# Patient Record
Sex: Male | Born: 1955 | Race: Black or African American | Hispanic: No | Marital: Married | Smoking: Never smoker
Health system: Southern US, Community
[De-identification: ages and names within clinical notes are randomized; demographics above are authoritative.]

## PROBLEM LIST (undated history)

## (undated) DIAGNOSIS — M545 Low back pain: Secondary | ICD-10-CM

## (undated) DIAGNOSIS — G8190 Hemiplegia, unspecified affecting unspecified side: Secondary | ICD-10-CM

## (undated) DIAGNOSIS — F438 Other reactions to severe stress: Secondary | ICD-10-CM

## (undated) DIAGNOSIS — I5022 Chronic systolic (congestive) heart failure: Secondary | ICD-10-CM

## (undated) DIAGNOSIS — F519 Sleep disorder not due to a substance or known physiological condition, unspecified: Secondary | ICD-10-CM

## (undated) DIAGNOSIS — E785 Hyperlipidemia, unspecified: Secondary | ICD-10-CM

## (undated) DIAGNOSIS — Z8679 Personal history of other diseases of the circulatory system: Secondary | ICD-10-CM

## (undated) DIAGNOSIS — I10 Essential (primary) hypertension: Secondary | ICD-10-CM

## (undated) DIAGNOSIS — E291 Testicular hypofunction: Secondary | ICD-10-CM

## (undated) HISTORY — DX: Low back pain: M54.5

## (undated) HISTORY — DX: Testicular hypofunction: E29.1

## (undated) HISTORY — DX: Hemiplegia, unspecified affecting unspecified side: G81.90

## (undated) HISTORY — DX: Other reactions to severe stress: F43.8

## (undated) HISTORY — PX: OTHER SURGICAL HISTORY: SHX169

## (undated) HISTORY — DX: Personal history of other diseases of the circulatory system: Z86.79

## (undated) HISTORY — DX: Sleep disorder not due to a substance or known physiological condition, unspecified: F51.9

## (undated) HISTORY — DX: Hyperlipidemia, unspecified: E78.5

---

## 1998-12-09 ENCOUNTER — Encounter: Payer: Self-pay | Admitting: Emergency Medicine

## 1998-12-09 ENCOUNTER — Emergency Department (HOSPITAL_COMMUNITY): Admission: EM | Admit: 1998-12-09 | Discharge: 1998-12-09 | Payer: Self-pay | Admitting: Emergency Medicine

## 1999-12-11 ENCOUNTER — Emergency Department (HOSPITAL_COMMUNITY): Admission: EM | Admit: 1999-12-11 | Discharge: 1999-12-11 | Payer: Self-pay | Admitting: Emergency Medicine

## 2005-04-12 ENCOUNTER — Ambulatory Visit: Payer: Self-pay | Admitting: Internal Medicine

## 2005-04-22 ENCOUNTER — Ambulatory Visit: Payer: Self-pay | Admitting: Cardiology

## 2005-05-26 ENCOUNTER — Ambulatory Visit: Payer: Self-pay | Admitting: Internal Medicine

## 2005-06-22 ENCOUNTER — Ambulatory Visit: Payer: Self-pay | Admitting: Internal Medicine

## 2005-12-16 ENCOUNTER — Ambulatory Visit: Payer: Self-pay | Admitting: Internal Medicine

## 2005-12-16 LAB — CONVERTED CEMR LAB: PSA: 1 ng/mL

## 2005-12-22 ENCOUNTER — Ambulatory Visit: Payer: Self-pay | Admitting: Endocrinology

## 2006-01-19 ENCOUNTER — Ambulatory Visit: Payer: Self-pay | Admitting: Endocrinology

## 2006-03-06 ENCOUNTER — Ambulatory Visit: Payer: Self-pay | Admitting: Internal Medicine

## 2006-03-14 ENCOUNTER — Ambulatory Visit: Payer: Self-pay | Admitting: Internal Medicine

## 2007-06-07 ENCOUNTER — Ambulatory Visit: Payer: Self-pay | Admitting: Internal Medicine

## 2007-06-08 ENCOUNTER — Encounter: Payer: Self-pay | Admitting: Internal Medicine

## 2007-06-08 DIAGNOSIS — M545 Low back pain, unspecified: Secondary | ICD-10-CM

## 2007-06-08 DIAGNOSIS — E785 Hyperlipidemia, unspecified: Secondary | ICD-10-CM

## 2007-06-08 DIAGNOSIS — Z8679 Personal history of other diseases of the circulatory system: Secondary | ICD-10-CM | POA: Insufficient documentation

## 2007-06-08 HISTORY — DX: Low back pain, unspecified: M54.50

## 2007-06-08 HISTORY — DX: Personal history of other diseases of the circulatory system: Z86.79

## 2007-06-08 HISTORY — DX: Hyperlipidemia, unspecified: E78.5

## 2007-06-14 ENCOUNTER — Ambulatory Visit: Payer: Self-pay | Admitting: Internal Medicine

## 2007-08-25 ENCOUNTER — Emergency Department (HOSPITAL_COMMUNITY): Admission: EM | Admit: 2007-08-25 | Discharge: 2007-08-25 | Payer: Self-pay | Admitting: Emergency Medicine

## 2007-10-10 ENCOUNTER — Ambulatory Visit: Payer: Self-pay | Admitting: Internal Medicine

## 2007-10-10 DIAGNOSIS — G8190 Hemiplegia, unspecified affecting unspecified side: Secondary | ICD-10-CM | POA: Insufficient documentation

## 2007-10-10 DIAGNOSIS — F519 Sleep disorder not due to a substance or known physiological condition, unspecified: Secondary | ICD-10-CM

## 2007-10-10 DIAGNOSIS — F438 Other reactions to severe stress: Secondary | ICD-10-CM

## 2007-10-10 DIAGNOSIS — E291 Testicular hypofunction: Secondary | ICD-10-CM

## 2007-10-10 DIAGNOSIS — F4389 Other reactions to severe stress: Secondary | ICD-10-CM

## 2007-10-10 HISTORY — DX: Sleep disorder not due to a substance or known physiological condition, unspecified: F51.9

## 2007-10-10 HISTORY — DX: Hemiplegia, unspecified affecting unspecified side: G81.90

## 2007-10-10 HISTORY — DX: Other reactions to severe stress: F43.89

## 2007-10-10 HISTORY — DX: Testicular hypofunction: E29.1

## 2007-10-10 HISTORY — DX: Other reactions to severe stress: F43.8

## 2007-10-26 ENCOUNTER — Ambulatory Visit: Payer: Self-pay | Admitting: Internal Medicine

## 2008-02-22 ENCOUNTER — Ambulatory Visit: Payer: Self-pay | Admitting: Internal Medicine

## 2011-05-28 ENCOUNTER — Encounter: Payer: Self-pay | Admitting: Internal Medicine

## 2011-05-28 DIAGNOSIS — Z0000 Encounter for general adult medical examination without abnormal findings: Secondary | ICD-10-CM | POA: Insufficient documentation

## 2011-05-30 ENCOUNTER — Ambulatory Visit (INDEPENDENT_AMBULATORY_CARE_PROVIDER_SITE_OTHER): Payer: Self-pay | Admitting: Internal Medicine

## 2011-05-30 ENCOUNTER — Encounter: Payer: Self-pay | Admitting: Internal Medicine

## 2011-05-30 VITALS — BP 120/78 | HR 69 | Temp 98.6°F | Ht 73.0 in | Wt 234.0 lb

## 2011-05-30 DIAGNOSIS — R109 Unspecified abdominal pain: Secondary | ICD-10-CM

## 2011-05-30 MED ORDER — METRONIDAZOLE 500 MG PO TABS: 500.0000 mg | ORAL_TABLET | Freq: Three times a day (TID) | ORAL | Status: AC

## 2011-05-30 NOTE — Patient Instructions (Signed)
Take all new medications as prescribed Continue all other medications as before Please let us know if you are improved, for the letter to be done Please let us know if you are not improved by the end of the week, as you should be referred to GI

## 2011-05-30 NOTE — Assessment & Plan Note (Signed)
Exam  Benign, but with signficant diarrhea, pain, hemtochezia ongoing - diff includes bacterial infect,  c diff, giardia , IBD or other inflamm or infectious bowel disease ;  Has no health ins today and simply cannot afford testing which would include ideally stool for c diff, culture, parasites;  As well as cbc and refer GI; for now will tx with flagyl asd,  to f/u any worsening symptoms or concerns

## 2011-05-30 NOTE — Progress Notes (Signed)
Subjective:    Patient ID: Ryan Cole, male    DOB: 1956-04-03, 55 y.o.   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  No n/v, wt loss, fever, and no obvious triggers that seem to bring it on, such as diet change.  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  No other flank of back pain, but overall pain worse even with walking, and sometimes has thought his abd distended, maybe he thought due to immodium use.  Did have about 6 wks abd pain, diarrhea and blood a few yrs ago but symptoms resolved with a "tx for a parasite" (? Giardia) in 1998.  Has had colonoscopy with [facility] - > 4-5 yrs ago, neg for polyps. Past Medical History  Diagnosis Date  . 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 HYPERLIPIDEMIA 06/08/2007  . HYPOGONADISM, MALE 10/10/2007  . INSOMNIA-SLEEP DISORDER-UNSPEC 10/10/2007  . LOW BACK PAIN 06/08/2007  . WEAKNESS, LEFT SIDE OF BODY 10/10/2007   No past surgical history on file.  reports that he has never smoked. He does not have any smokeless tobacco history on file. He reports that he does not drink alcohol. His drug history not on file. family history is not on file. No Known Allergies Current Outpatient Prescriptions on File Prior to Visit  Medication Sig Dispense Refill  . cyclobenzaprine (FLEXERIL) 5 MG tablet Take 5 mg by mouth 3 (three) times daily as needed.        . diclofenac (VOLTAREN) 75 MG EC tablet Take 75 mg by mouth 2 (two) times daily.        Marland Kitchen HYDROcodone-acetaminophen (VICODIN) 5-500  MG per tablet Take 1 tablet by mouth 4 (four) times daily as needed.        . zolpidem (AMBIEN) 10 MG tablet Take 10 mg by mouth at bedtime as needed.         Review of Systems Review of Systems  Constitutional: Negative for diaphoresis and unexpected weight change.  HENT: Negative for drooling and tinnitus.   Eyes: Negative for photophobia and visual disturbance.  Respiratory: Negative for choking and stridor.       Objective:   Physical Exam BP 120/78  Pulse 69  Temp(Src) 98.6 F (37 C) (Oral)  Ht 6\' 1"  (1.854 m)  Wt 234 lb (106.142 kg)  BMI 30.87 kg/m2  SpO2 98% Physical Exam  VS noted Constitutional: Pt appears well-developed and well-nourished.  HENT: Head: Normocephalic.  Right Ear: External ear normal.  Left Ear: External ear normal.  Eyes: Conjunctivae and EOM are normal. Pupils are equal, round, and reactive to light.  Neck: Normal range of motion. Neck supple.  Cardiovascular: Normal rate and regular rhythm.   Pulmonary/Chest: Effort normal and breath sounds normal.  Abd:  Soft, non-distended, + BS, mild diffuse lower abd tender, no guarding or rebound Neurological: Pt is alert. No cranial nerve deficit.  Skin: Skin is warm. No erythema.  Psychiatric: Pt behavior is normal. Thought content  normal.         Assessment & Plan:

## 2011-06-08 ENCOUNTER — Encounter: Payer: Self-pay | Admitting: Internal Medicine

## 2011-07-26 LAB — I-STAT 8, (EC8 V) (CONVERTED LAB)
BUN: 19
Bicarbonate: 24.5 — ABNORMAL HIGH
HCT: 41
Hemoglobin: 13.9
Operator id: 192351
TCO2: 26
pCO2, Ven: 38.7 — ABNORMAL LOW

## 2011-07-26 LAB — URINE MICROSCOPIC-ADD ON

## 2011-07-26 LAB — RAPID URINE DRUG SCREEN, HOSP PERFORMED
Amphetamines: NOT DETECTED
Benzodiazepines: NOT DETECTED
Cocaine: NOT DETECTED
Tetrahydrocannabinol: NOT DETECTED

## 2011-07-26 LAB — URINALYSIS, ROUTINE W REFLEX MICROSCOPIC
Bilirubin Urine: NEGATIVE
Ketones, ur: 15 — AB
Specific Gravity, Urine: 1.034 — ABNORMAL HIGH
Urobilinogen, UA: 0.2

## 2011-07-26 LAB — HEPATIC FUNCTION PANEL
ALT: 33
AST: 45 — ABNORMAL HIGH
Bilirubin, Direct: <0.1

## 2011-07-26 LAB — ACETAMINOPHEN LEVEL: Acetaminophen (Tylenol), Serum: <10 — ABNORMAL LOW

## 2012-05-30 ENCOUNTER — Emergency Department (HOSPITAL_COMMUNITY): Payer: Non-veteran care

## 2012-05-30 ENCOUNTER — Encounter (HOSPITAL_COMMUNITY): Payer: Self-pay | Admitting: *Deleted

## 2012-05-30 ENCOUNTER — Emergency Department (HOSPITAL_COMMUNITY)
Admission: EM | Admit: 2012-05-30 | Discharge: 2012-05-30 | Disposition: A | Discharge status: Home / Self Care | Payer: Non-veteran care | Attending: Emergency Medicine | Admitting: Emergency Medicine

## 2012-05-30 DIAGNOSIS — Z7982 Long term (current) use of aspirin: Secondary | ICD-10-CM | POA: Insufficient documentation

## 2012-05-30 DIAGNOSIS — R1110 Vomiting, unspecified: Secondary | ICD-10-CM | POA: Insufficient documentation

## 2012-05-30 DIAGNOSIS — E785 Hyperlipidemia, unspecified: Secondary | ICD-10-CM | POA: Insufficient documentation

## 2012-05-30 DIAGNOSIS — R197 Diarrhea, unspecified: Secondary | ICD-10-CM

## 2012-05-30 DIAGNOSIS — K5289 Other specified noninfective gastroenteritis and colitis: Secondary | ICD-10-CM | POA: Insufficient documentation

## 2012-05-30 DIAGNOSIS — Z8673 Personal history of transient ischemic attack (TIA), and cerebral infarction without residual deficits: Secondary | ICD-10-CM | POA: Insufficient documentation

## 2012-05-30 DIAGNOSIS — R109 Unspecified abdominal pain: Secondary | ICD-10-CM | POA: Insufficient documentation

## 2012-05-30 DIAGNOSIS — K529 Noninfective gastroenteritis and colitis, unspecified: Secondary | ICD-10-CM

## 2012-05-30 LAB — URINALYSIS, ROUTINE W REFLEX MICROSCOPIC
Glucose, UA: NEGATIVE mg/dL
Protein, ur: 100 mg/dL — AB

## 2012-05-30 LAB — CBC
HCT: 31.8 % — ABNORMAL LOW (ref 39.0–52.0)
MCV: 88.1 fL (ref 78.0–100.0)
RBC: 3.61 MIL/uL — ABNORMAL LOW (ref 4.22–5.81)
RDW: 14.5 % (ref 11.5–15.5)
WBC: 10.2 10*3/uL (ref 4.0–10.5)

## 2012-05-30 LAB — COMPREHENSIVE METABOLIC PANEL
BUN: 17 mg/dL (ref 6–23)
CO2: 27 mEq/L (ref 19–32)
Chloride: 100 mEq/L (ref 96–112)
Creatinine, Ser: 0.94 mg/dL (ref 0.50–1.35)
GFR calc Af Amer: >90 mL/min (ref >90)
GFR calc non Af Amer: >90 mL/min (ref >90)
Glucose, Bld: 102 mg/dL — ABNORMAL HIGH (ref 70–99)
Total Bilirubin: 0.2 mg/dL — ABNORMAL LOW (ref 0.3–1.2)

## 2012-05-30 LAB — URINE MICROSCOPIC-ADD ON

## 2012-05-30 MED ORDER — CIPROFLOXACIN HCL 500 MG PO TABS: 500.0000 mg | ORAL_TABLET | Freq: Two times a day (BID) | ORAL | Status: AC

## 2012-05-30 MED ORDER — ONDANSETRON 8 MG PO TBDP: 8.0000 mg | ORAL_TABLET | Freq: Three times a day (TID) | ORAL | Status: AC | PRN

## 2012-05-30 MED ORDER — ONDANSETRON 4 MG PO TBDP
8.0000 mg | ORAL_TABLET | Freq: Once | ORAL | Status: AC
  Administered 2012-05-30: 8 mg via ORAL
  Filled 2012-05-30: qty 2

## 2012-05-30 MED ORDER — HYDROCODONE-ACETAMINOPHEN 5-325 MG PO TABS: 1.0000 | ORAL_TABLET | ORAL | Status: AC | PRN

## 2012-05-30 MED ORDER — METRONIDAZOLE 500 MG PO TABS
500.0000 mg | ORAL_TABLET | Freq: Once | ORAL | Status: AC
  Administered 2012-05-30: 500 mg via ORAL
  Filled 2012-05-30: qty 1

## 2012-05-30 MED ORDER — IOHEXOL 300 MG/ML  SOLN
100.0000 mL | Freq: Once | INTRAMUSCULAR | Status: AC | PRN
  Administered 2012-05-30: 100 mL via INTRAVENOUS

## 2012-05-30 MED ORDER — METRONIDAZOLE 500 MG PO TABS: 500.0000 mg | ORAL_TABLET | Freq: Three times a day (TID) | ORAL | Status: AC

## 2012-05-30 MED ORDER — CIPROFLOXACIN HCL 500 MG PO TABS
500.0000 mg | ORAL_TABLET | Freq: Once | ORAL | Status: AC
  Administered 2012-05-30: 500 mg via ORAL
  Filled 2012-05-30: qty 1

## 2012-05-30 NOTE — ED Notes (Signed)
Pt updated on wait time. Family and pt understand and are comfortable, ,warm blanket given

## 2012-05-30 NOTE — ED Provider Notes (Addendum)
History     CSN: [id number]  Arrival date & time 05/30/12  1102   First MD Initiated Contact with Patient 05/30/12 1557      Chief Complaint  Patient presents with  . Diarrhea  . Abdominal Pain    (Consider location/radiation/quality/duration/timing/severity/associated sxs/prior treatment) HPI Pt with several years of persistent diffuse abdominal bloating, diarrhea, and occasionally bloody stools reports she has been getting worse over the last several weeks. He has been evaluated by PCP about a year ago but could not afford any diagnostic testing, given a Rx for Flagyl which helped some. [facility] but was unable to tolerate eating or drinking much in the last several days and has been getting progressively more weak. Denies vomiting, no fever  Past Medical History  Diagnosis Date  . 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 HYPERLIPIDEMIA 06/08/2007  . HYPOGONADISM, MALE 10/10/2007  . INSOMNIA-SLEEP DISORDER-UNSPEC 10/10/2007  . LOW BACK PAIN 06/08/2007  . WEAKNESS, LEFT SIDE OF BODY 10/10/2007    History reviewed. No pertinent past surgical history.  History reviewed. No pertinent family history.  History  Substance Use Topics  . Smoking status: Never Smoker   . Smokeless tobacco: Not on file  . Alcohol Use: No      Review of Systems All other systems reviewed and are negative except as noted in HPI.   Allergies  Review of patient's allergies indicates no known allergies.  Home Medications   Current Outpatient Rx  Name Route Sig Dispense Refill  . ASPIRIN 81 MG PO TABS Oral Take 81 mg by mouth daily.      Marland Kitchen OVER THE COUNTER MEDICATION Both Eyes Place 1 drop into both eyes 2 (two) times daily as needed. Eye drops for red eye      BP 118/69  Pulse 97  Temp 98.4 F (36.9 C) (Oral)  Resp 13  SpO2 97%  Physical Exam  Nursing note and vitals reviewed. Constitutional: He is oriented to  person, place, and time. He appears well-developed and well-nourished.  HENT:  Head: Normocephalic and atraumatic.  Eyes: EOM are normal. Pupils are equal, round, and reactive to light.  Neck: Normal range of motion. Neck supple.  Cardiovascular: Normal rate, normal heart sounds and intact distal pulses.   Pulmonary/Chest: Effort normal and breath sounds normal.  Abdominal: Bowel sounds are normal. He exhibits no distension. There is no tenderness. There is no rebound and no guarding.  Musculoskeletal: Normal range of motion. He exhibits no edema and no tenderness.  Neurological: He is alert and oriented to person, place, and time. He has normal strength. No cranial nerve deficit or sensory deficit.  Skin: Skin is warm and dry. No rash noted.  Psychiatric: He has a normal mood and affect.    ED Course  Procedures (including critical care time)  Labs Reviewed  URINALYSIS, ROUTINE W REFLEX MICROSCOPIC - Abnormal; Notable for the following:    Color, Urine AMBER (*)  BIOCHEMICALS MAY BE AFFECTED BY COLOR   APPearance CLOUDY (*)     Specific Gravity, Urine 1.039 (*)     Hgb urine dipstick MODERATE (*)     Bilirubin Urine SMALL (*)     Ketones, ur 15 (*)     Protein, ur 100 (*)     All other components within normal limits  CBC - Abnormal; Notable for the following:    RBC 3.61 (*)  Hemoglobin 10.5 (*)     HCT 31.8 (*)     All other components within normal limits  COMPREHENSIVE METABOLIC PANEL - Abnormal; Notable for the following:    Potassium 3.3 (*)     Glucose, Bld 102 (*)     Albumin 3.1 (*)     Total Bilirubin 0.2 (*)     All other components within normal limits  URINE MICROSCOPIC-ADD ON - Abnormal; Notable for the following:    Bacteria, UA MANY (*)     All other components within normal limits  STOOL CULTURE  CLOSTRIDIUM DIFFICILE BY PCR   No results found.   No diagnosis found.    MDM  Abd benign, suspect some sort of IBD such as Crohn's or UC. Will send  for CT. IVF.    Discussed with Jaynie Crumble, PA in CDU.    Charles B. Bernette Mayers, MD 05/31/12 1610

## 2012-05-30 NOTE — ED Notes (Signed)
Pt. oob to the bathroom, gait steady, diarrhea continues.

## 2012-05-30 NOTE — ED Notes (Signed)
Pt reports having chronic abd pain, diarrhea and nausea x 1 year. Had been tx with flaggyl in past which helped temporarily, but symptoms have returned. Has appt on 8/21. No acute distress noted at triage.

## 2012-05-30 NOTE — ED Provider Notes (Signed)
Pt seen and examined by me in CDU. Pt with abdominal cramping and diarrhea on and off for a year. Now symptoms worsening and he is having nausea, vomiting. Pt [facility] but feeling worse today. Pt in CDU awaiting CT abd/pelvis.   Results for orders placed during the hospital encounter of 05/30/12  URINALYSIS, ROUTINE W REFLEX MICROSCOPIC      Component Value Range   Color, Urine AMBER (*) YELLOW   APPearance CLOUDY (*) CLEAR   Specific Gravity, Urine 1.039 (*) 1.005 - 1.030   pH 5.5  5.0 - 8.0   Glucose, UA NEGATIVE  NEGATIVE mg/dL   Hgb urine dipstick MODERATE (*) NEGATIVE   Bilirubin Urine SMALL (*) NEGATIVE   Ketones, ur 15 (*) NEGATIVE mg/dL   Protein, ur 098 (*) NEGATIVE mg/dL   Urobilinogen, UA 0.2  0.0 - 1.0 mg/dL   Nitrite NEGATIVE  NEGATIVE   Leukocytes, UA NEGATIVE  NEGATIVE  CBC      Component Value Range   WBC 10.2  4.0 - 10.5 K/uL   RBC 3.61 (*) 4.22 - 5.81 MIL/uL   Hemoglobin 10.5 (*) 13.0 - 17.0 g/dL   HCT 11.9 (*) 14.7 - 82.9 %   MCV 88.1  78.0 - 100.0 fL   MCH 29.1  26.0 - 34.0 pg   MCHC 33.0  30.0 - 36.0 g/dL   RDW 56.2  13.0 - 86.5 %   Platelets 399  150 - 400 K/uL  COMPREHENSIVE METABOLIC PANEL      Component Value Range   Sodium 140  135 - 145 mEq/L   Potassium 3.3 (*) 3.5 - 5.1 mEq/L   Chloride 100  96 - 112 mEq/L   CO2 27  19 - 32 mEq/L   Glucose, Bld 102 (*) 70 - 99 mg/dL   BUN 17  6 - 23 mg/dL   Creatinine, Ser 7.84  0.50 - 1.35 mg/dL   Calcium 9.3  8.4 - 69.6 mg/dL   Total Protein 7.3  6.0 - 8.3 g/dL   Albumin 3.1 (*) 3.5 - 5.2 g/dL   AST 16  0 - 37 U/L   ALT 15  0 - 53 U/L   Alkaline Phosphatase 82  39 - 117 U/L   Total Bilirubin 0.2 (*) 0.3 - 1.2 mg/dL   GFR calc non Af Amer >90  >90 mL/min   GFR calc Af Amer >90  >90 mL/min  URINE MICROSCOPIC-ADD ON      Component Value Range   WBC, UA 0-2  <3 WBC/hpf   RBC / HPF 7-10  <3 RBC/hpf   Bacteria, UA MANY (*) RARE   Urine-Other MUCOUS PRESENT     Ct  Abdomen Pelvis W Contrast  05/30/2012  *RADIOLOGY REPORT*  Clinical Data: Chronic abdominal pain and bloating.  Diarrhea.  CT ABDOMEN AND PELVIS WITH CONTRAST  Technique:  Multidetector CT imaging of the abdomen and pelvis was performed following the standard protocol during bolus administration of intravenous contrast.  Contrast: OMNIPAQUE IOHEXOL 300 MG/ML  SOLN  Comparison: CT abdomen pelvis 04/22/2005.  Findings: Mild bibasilar atelectasis is noted.  There is no pleural or pericardial effusion.  Heart size is normal.  There is nonsegmental wall thickening of the colon, relative sparing of the transverse.  Changes appear worst in the descending colon.  The appendix appears normal.  There are some mildly dilated loops of small bowel in the left lower quadrant which may represent  ileus secondary to inflammatory change of the colon.  The patient has a small umbilical hernia containing a loop of small bowel without obstruction.  No pneumatosis, portal venous gas or free intraperitoneal air is identified.  A tiny hypoattenuating lesion in the liver on image 15 cannot be fully characterize but likely represents a cyst.  The liver is otherwise unremarkable.  The gallbladder, adrenal glands, spleen and kidneys appear normal.  No lymphadenopathy or intra-abdominal fluid collection is identified.  There is a fluid density structure in the subcutaneous tissues in the midline of the low back measuring 4.3 x 3.2 cm in axial plane.  No focal bony abnormality is identified.  IMPRESSION:  1.  Colitis which appears worst in the descending.  The appearance is most consistent with infectious or inflammatory process.  No evidence of ischemia is identified. 2.  Likely sebaceous cyst in the subcutaneous tissues the midline of the back. 3.  Small umbilical hernia contains a loop of small bowel without evidence of obstruction or other complicating feature.  Original Report Authenticated By: Bernadene Bell. D'ALESSIO, M.D.   9:40  PM CT abdomen pelvis showing colitis. Will start pt on cirpo and flagyl. Pt does have follow up with his doctor and GI at [facility] in a week. He is otherwise stable, non toxic, tolerating POs here. Stool cultures and c-diff cultures pending.   Filed Vitals:   05/30/12 1830  BP: 132/72  Pulse: 98  Temp:   Resp:    Pt stable for d/c.    Lottie Mussel, PA 05/31/12 0030

## 2012-05-30 NOTE — ED Notes (Signed)
Patient C/O uncontrolled diarrhea for 1 year.  States that he was treated with flagyl in the past.  C/O his abdomen being swollen and having symptoms of a parasitic infection.  Patient states that he has been unable to eat. C/O losing weight and muscle mass.

## 2012-05-31 LAB — CLOSTRIDIUM DIFFICILE BY PCR: Toxigenic C. Difficile by PCR: NEGATIVE

## 2012-10-17 HISTORY — PX: ABDOMINAL SURGERY: SHX537

## 2015-08-02 ENCOUNTER — Emergency Department (HOSPITAL_COMMUNITY): Payer: Non-veteran care

## 2015-08-02 ENCOUNTER — Encounter (HOSPITAL_COMMUNITY)
Admission: EM | Disposition: A | Discharge status: Home / Self Care | Payer: Non-veteran care | Source: Home / Self Care | Attending: Thoracic Surgery (Cardiothoracic Vascular Surgery)

## 2015-08-02 ENCOUNTER — Emergency Department (HOSPITAL_COMMUNITY): Payer: Non-veteran care | Admitting: Certified Registered"

## 2015-08-02 ENCOUNTER — Encounter (HOSPITAL_COMMUNITY): Payer: Self-pay | Admitting: Emergency Medicine

## 2015-08-02 ENCOUNTER — Inpatient Hospital Stay (HOSPITAL_COMMUNITY)
Admission: EM | Admit: 2015-08-02 | Discharge: 2015-08-16 | DRG: 268 | Disposition: A | Discharge status: Home / Self Care | Payer: Non-veteran care | Attending: Thoracic Surgery (Cardiothoracic Vascular Surgery) | Admitting: Thoracic Surgery (Cardiothoracic Vascular Surgery)

## 2015-08-02 DIAGNOSIS — I480 Paroxysmal atrial fibrillation: Secondary | ICD-10-CM | POA: Diagnosis not present

## 2015-08-02 DIAGNOSIS — D696 Thrombocytopenia, unspecified: Secondary | ICD-10-CM | POA: Diagnosis present

## 2015-08-02 DIAGNOSIS — K5190 Ulcerative colitis, unspecified, without complications: Secondary | ICD-10-CM | POA: Diagnosis present

## 2015-08-02 DIAGNOSIS — I71019 Dissection of thoracic aorta, unspecified: Secondary | ICD-10-CM

## 2015-08-02 DIAGNOSIS — D62 Acute posthemorrhagic anemia: Secondary | ICD-10-CM | POA: Diagnosis not present

## 2015-08-02 DIAGNOSIS — I7101 Dissection of thoracic aorta: Secondary | ICD-10-CM | POA: Diagnosis not present

## 2015-08-02 DIAGNOSIS — I214 Non-ST elevation (NSTEMI) myocardial infarction: Secondary | ICD-10-CM | POA: Diagnosis not present

## 2015-08-02 DIAGNOSIS — R339 Retention of urine, unspecified: Secondary | ICD-10-CM | POA: Diagnosis not present

## 2015-08-02 DIAGNOSIS — I4892 Unspecified atrial flutter: Secondary | ICD-10-CM | POA: Diagnosis not present

## 2015-08-02 DIAGNOSIS — R931 Abnormal findings on diagnostic imaging of heart and coronary circulation: Secondary | ICD-10-CM | POA: Insufficient documentation

## 2015-08-02 DIAGNOSIS — I10 Essential (primary) hypertension: Secondary | ICD-10-CM | POA: Diagnosis present

## 2015-08-02 DIAGNOSIS — I5021 Acute systolic (congestive) heart failure: Secondary | ICD-10-CM | POA: Diagnosis not present

## 2015-08-02 DIAGNOSIS — I351 Nonrheumatic aortic (valve) insufficiency: Secondary | ICD-10-CM | POA: Diagnosis present

## 2015-08-02 DIAGNOSIS — I959 Hypotension, unspecified: Secondary | ICD-10-CM | POA: Diagnosis not present

## 2015-08-02 DIAGNOSIS — G9340 Encephalopathy, unspecified: Secondary | ICD-10-CM | POA: Diagnosis not present

## 2015-08-02 DIAGNOSIS — J942 Hemothorax: Secondary | ICD-10-CM

## 2015-08-02 DIAGNOSIS — R778 Other specified abnormalities of plasma proteins: Secondary | ICD-10-CM | POA: Insufficient documentation

## 2015-08-02 DIAGNOSIS — Z09 Encounter for follow-up examination after completed treatment for conditions other than malignant neoplasm: Secondary | ICD-10-CM

## 2015-08-02 DIAGNOSIS — Z7952 Long term (current) use of systemic steroids: Secondary | ICD-10-CM

## 2015-08-02 DIAGNOSIS — G4700 Insomnia, unspecified: Secondary | ICD-10-CM | POA: Diagnosis present

## 2015-08-02 DIAGNOSIS — Z79899 Other long term (current) drug therapy: Secondary | ICD-10-CM

## 2015-08-02 DIAGNOSIS — Z8673 Personal history of transient ischemic attack (TIA), and cerebral infarction without residual deficits: Secondary | ICD-10-CM

## 2015-08-02 DIAGNOSIS — D65 Disseminated intravascular coagulation [defibrination syndrome]: Secondary | ICD-10-CM | POA: Diagnosis not present

## 2015-08-02 DIAGNOSIS — I7100 Dissection of unspecified site of aorta: Secondary | ICD-10-CM

## 2015-08-02 DIAGNOSIS — Z7982 Long term (current) use of aspirin: Secondary | ICD-10-CM

## 2015-08-02 DIAGNOSIS — R7989 Other specified abnormal findings of blood chemistry: Secondary | ICD-10-CM

## 2015-08-02 DIAGNOSIS — R0789 Other chest pain: Secondary | ICD-10-CM | POA: Diagnosis present

## 2015-08-02 DIAGNOSIS — R5381 Other malaise: Secondary | ICD-10-CM | POA: Diagnosis not present

## 2015-08-02 DIAGNOSIS — I4891 Unspecified atrial fibrillation: Secondary | ICD-10-CM | POA: Diagnosis not present

## 2015-08-02 HISTORY — PX: THORACIC AORTIC ANEURYSM REPAIR: SHX799

## 2015-08-02 HISTORY — DX: Essential (primary) hypertension: I10

## 2015-08-02 LAB — I-STAT TROPONIN, ED
TROPONIN I, POC: 0 ng/mL (ref 0.00–0.08)
Troponin i, poc: 0 ng/mL (ref 0.00–0.08)

## 2015-08-02 LAB — CBC
HEMATOCRIT: 34.6 % — AB (ref 39.0–52.0)
Hemoglobin: 10.9 g/dL — ABNORMAL LOW (ref 13.0–17.0)
MCH: 30.6 pg (ref 26.0–34.0)
MCHC: 31.5 g/dL (ref 30.0–36.0)
MCV: 97.2 fL (ref 78.0–100.0)
PLATELETS: 204 10*3/uL (ref 150–400)
RBC: 3.56 MIL/uL — AB (ref 4.22–5.81)
RDW: 16.3 % — ABNORMAL HIGH (ref 11.5–15.5)
WBC: 6.1 10*3/uL (ref 4.0–10.5)

## 2015-08-02 LAB — BASIC METABOLIC PANEL
Anion gap: 8 (ref 5–15)
BUN: 16 mg/dL (ref 6–20)
CHLORIDE: 102 mmol/L (ref 101–111)
CO2: 26 mmol/L (ref 22–32)
CREATININE: 0.87 mg/dL (ref 0.61–1.24)
Calcium: 9 mg/dL (ref 8.9–10.3)
GFR calc Af Amer: >60 mL/min (ref >60)
GFR calc non Af Amer: >60 mL/min (ref >60)
Glucose, Bld: 112 mg/dL — ABNORMAL HIGH (ref 65–99)
POTASSIUM: 4.6 mmol/L (ref 3.5–5.1)
Sodium: 136 mmol/L (ref 135–145)

## 2015-08-02 LAB — PROTIME-INR
INR: 1.17 (ref 0.00–1.49)
Prothrombin Time: 15.1 s (ref 11.6–15.2)

## 2015-08-02 LAB — APTT: aPTT: 34 s (ref 24–37)

## 2015-08-02 LAB — D-DIMER, QUANTITATIVE (NOT AT ARMC): D DIMER QUANT: 7.9 ug{FEU}/mL — AB (ref 0.00–0.48)

## 2015-08-02 SURGERY — REPAIR, ANEURYSM, AORTA, THORACIC, ASCENDING
Anesthesia: General | Site: Chest

## 2015-08-02 MED ORDER — MAGNESIUM SULFATE 50 % IJ SOLN
40.0000 meq | INTRAMUSCULAR | Status: DC
  Filled 2015-08-02: qty 10

## 2015-08-02 MED ORDER — NITROGLYCERIN IN D5W 200-5 MCG/ML-% IV SOLN
2.0000 ug/min | INTRAVENOUS | Status: DC
  Filled 2015-08-02: qty 250

## 2015-08-02 MED ORDER — SODIUM CHLORIDE 0.9 % IV SOLN
INTRAVENOUS | Status: DC
  Filled 2015-08-02: qty 30

## 2015-08-02 MED ORDER — SODIUM CHLORIDE 0.9 % IV SOLN
INTRAVENOUS | Status: AC
  Administered 2015-08-03: 1 [IU]/h via INTRAVENOUS
  Filled 2015-08-02: qty 2.5

## 2015-08-02 MED ORDER — DOPAMINE-DEXTROSE 3.2-5 MG/ML-% IV SOLN
0.0000 ug/kg/min | INTRAVENOUS | Status: AC
  Administered 2015-08-03: 5 ug/kg/min via INTRAVENOUS
  Filled 2015-08-02: qty 250

## 2015-08-02 MED ORDER — MIDAZOLAM HCL 10 MG/2ML IJ SOLN
INTRAMUSCULAR | Status: AC
  Filled 2015-08-02: qty 4

## 2015-08-02 MED ORDER — POTASSIUM CHLORIDE 2 MEQ/ML IV SOLN
80.0000 meq | INTRAVENOUS | Status: DC
  Filled 2015-08-02: qty 40

## 2015-08-02 MED ORDER — EPINEPHRINE HCL 1 MG/ML IJ SOLN
0.0000 ug/min | INTRAVENOUS | Status: DC
  Filled 2015-08-02: qty 4

## 2015-08-02 MED ORDER — SODIUM CHLORIDE 0.9 % IV SOLN
Freq: Once | INTRAVENOUS | Status: AC
  Administered 2015-08-02: 22:00:00 via INTRAVENOUS

## 2015-08-02 MED ORDER — FENTANYL CITRATE (PF) 250 MCG/5ML IJ SOLN
INTRAMUSCULAR | Status: AC
  Filled 2015-08-02: qty 5

## 2015-08-02 MED ORDER — GI COCKTAIL ~~LOC~~
30.0000 mL | Freq: Once | ORAL | Status: AC
  Administered 2015-08-02: 30 mL via ORAL
  Filled 2015-08-02: qty 30

## 2015-08-02 MED ORDER — PLASMA-LYTE 148 IV SOLN
INTRAVENOUS | Status: AC
  Administered 2015-08-03
  Filled 2015-08-02: qty 2.5

## 2015-08-02 MED ORDER — VANCOMYCIN HCL 10 G IV SOLR
1250.0000 mg | INTRAVENOUS | Status: DC
  Filled 2015-08-02: qty 1250

## 2015-08-02 MED ORDER — DEXTROSE 5 % IV SOLN
750.0000 mg | INTRAVENOUS | Status: DC
  Filled 2015-08-02: qty 750

## 2015-08-02 MED ORDER — DEXTROSE 5 % IV SOLN
1.5000 g | INTRAVENOUS | Status: AC
  Administered 2015-08-03: 1.5 g via INTRAVENOUS
  Administered 2015-08-03: .75 g via INTRAVENOUS
  Filled 2015-08-02: qty 1.5

## 2015-08-02 MED ORDER — IOHEXOL 350 MG/ML SOLN
100.0000 mL | Freq: Once | INTRAVENOUS | Status: AC | PRN
  Administered 2015-08-02: 100 mL via INTRAVENOUS

## 2015-08-02 MED ORDER — PHENYLEPHRINE HCL 10 MG/ML IJ SOLN
30.0000 ug/min | INTRAVENOUS | Status: AC
  Administered 2015-08-03: 15 ug/min via INTRAVENOUS
  Filled 2015-08-02: qty 2

## 2015-08-02 MED ORDER — VANCOMYCIN HCL 10 G IV SOLR
1500.0000 mg | INTRAVENOUS | Status: AC
  Administered 2015-08-03: 1500 mg via INTRAVENOUS
  Filled 2015-08-02: qty 1500

## 2015-08-02 MED ORDER — ASPIRIN 81 MG PO CHEW: 324.0000 mg | CHEWABLE_TABLET | Freq: Once | ORAL | Status: DC

## 2015-08-02 MED ORDER — DEXTROSE 5 % IV SOLN
0.0000 ug/min | INTRAVENOUS | Status: DC
  Administered 2015-08-03: 3 ug/min via INTRAVENOUS
  Filled 2015-08-02 (×2): qty 4

## 2015-08-02 MED ORDER — DEXMEDETOMIDINE HCL IN NACL 400 MCG/100ML IV SOLN
0.1000 ug/kg/h | INTRAVENOUS | Status: AC
  Administered 2015-08-03: .3 ug/kg/h via INTRAVENOUS
  Administered 2015-08-03: 09:00:00 via INTRAVENOUS
  Filled 2015-08-02 (×2): qty 100

## 2015-08-02 MED ORDER — PROPOFOL 10 MG/ML IV BOLUS
INTRAVENOUS | Status: AC
  Filled 2015-08-02: qty 20

## 2015-08-02 MED ORDER — SODIUM CHLORIDE 0.9 % IV SOLN
INTRAVENOUS | Status: DC
  Filled 2015-08-02 (×2): qty 40

## 2015-08-02 SURGICAL SUPPLY — 104 items
ADAPTER CARDIO PERF ANTE/RETRO (ADAPTER) ×5 IMPLANT
ADH SKN CLS APL DERMABOND .7 (GAUZE/BANDAGES/DRESSINGS) ×1
ADPR PRFSN 84XANTGRD RTRGD (ADAPTER) ×2
APL SKNCLS STERI-STRIP NONHPOA (GAUZE/BANDAGES/DRESSINGS) ×1
BAG DECANTER FOR FLEXI CONT (MISCELLANEOUS) ×3 IMPLANT
BENZOIN TINCTURE PRP APPL 2/3 (GAUZE/BANDAGES/DRESSINGS) ×2 IMPLANT
BLADE STERNUM SYSTEM 6 (BLADE) ×3 IMPLANT
BLADE SURG 15 STRL LF DISP TIS (BLADE) ×1 IMPLANT
BLADE SURG 15 STRL SS (BLADE) ×3
BLADE SURG ROTATE 9660 (MISCELLANEOUS) IMPLANT
CANISTER SUCTION 2500CC (MISCELLANEOUS) ×3 IMPLANT
CANN PRFSN .5XCNCT 15X34-48 (MISCELLANEOUS)
CANNULA ARTERIAL 007325 (MISCELLANEOUS) IMPLANT
CANNULA ARTERIAL 14F 007324 (MISCELLANEOUS) IMPLANT
CANNULA DLP CORONARY OST 12FR (MISCELLANEOUS) ×2 IMPLANT
CANNULA GRAFT 8MMX50CM (Graft) ×2 IMPLANT
CANNULA PRFSN .5XCNCT 15X34-48 (MISCELLANEOUS) IMPLANT
CANNULA VEN 2 STAGE (MISCELLANEOUS)
CATH HEART VENT LEFT (CATHETERS) IMPLANT
CATH THORACIC 28FR (CATHETERS) IMPLANT
CATH THORACIC 36FR (CATHETERS) ×4 IMPLANT
CATH THORACIC 36FR RT ANG (CATHETERS) ×2 IMPLANT
CATH/SQUID NICHOLS JEHLE COR (CATHETERS) ×2 IMPLANT
CAUTERY EYE LOW TEMP 1300F FIN (OPHTHALMIC RELATED) ×3 IMPLANT
CLIP TI MEDIUM 6 (CLIP) ×3 IMPLANT
CLIP TI WIDE RED SMALL 24 (CLIP) IMPLANT
CONN 3/8X3/8 GISH STERILE (MISCELLANEOUS) IMPLANT
CONN Y 3/8X3/8X3/8  BEN (MISCELLANEOUS)
CONN Y 3/8X3/8X3/8 BEN (MISCELLANEOUS) IMPLANT
CONT SPEC 4OZ CLIKSEAL STRL BL (MISCELLANEOUS) ×2 IMPLANT
CRADLE DONUT ADULT HEAD (MISCELLANEOUS) IMPLANT
DERMABOND ADVANCED (GAUZE/BANDAGES/DRESSINGS) ×2
DERMABOND ADVANCED .7 DNX12 (GAUZE/BANDAGES/DRESSINGS) IMPLANT
DRAIN CHANNEL 28F RND 3/8 FF (WOUND CARE) ×2 IMPLANT
DRSG COVADERM 4X14 (GAUZE/BANDAGES/DRESSINGS) ×3 IMPLANT
ELECT REM PT RETURN 9FT ADLT (ELECTROSURGICAL) ×6
ELECTRODE REM PT RTRN 9FT ADLT (ELECTROSURGICAL) ×2 IMPLANT
FELT TEFLON 6X6 (MISCELLANEOUS) ×2 IMPLANT
GAUZE SPONGE 4X4 12PLY STRL (GAUZE/BANDAGES/DRESSINGS) ×6 IMPLANT
GOWN STRL REUS W/ TWL LRG LVL3 (GOWN DISPOSABLE) ×4 IMPLANT
GOWN STRL REUS W/TWL LRG LVL3 (GOWN DISPOSABLE) ×12
GRAFT BRANCH HEMA 14X10X10X30 (Prosthesis & Implant Heart) ×2 IMPLANT
HEMOSTAT POWDER SURGIFOAM 1G (HEMOSTASIS) ×8 IMPLANT
HEMOSTAT SURGICEL 2X14 (HEMOSTASIS) ×14 IMPLANT
INSERT FOGARTY SM (MISCELLANEOUS) ×3 IMPLANT
KIT BASIN OR (CUSTOM PROCEDURE TRAY) ×3 IMPLANT
KIT DRAINAGE VACCUM ASSIST (KITS) ×2 IMPLANT
KIT ROOM TURNOVER OR (KITS) ×3 IMPLANT
KIT SUCTION CATH 14FR (SUCTIONS) ×4 IMPLANT
LINE VENT (MISCELLANEOUS) ×2 IMPLANT
LOOP VESSEL MAXI BLUE (MISCELLANEOUS) ×4 IMPLANT
LOOP VESSEL SUPERMAXI WHITE (MISCELLANEOUS) ×2 IMPLANT
NEEDLE AORTIC AIR ASPIRATING (NEEDLE) ×2 IMPLANT
NS IRRIG 1000ML POUR BTL (IV SOLUTION) ×12 IMPLANT
PACK OPEN HEART (CUSTOM PROCEDURE TRAY) ×3 IMPLANT
PAD ARMBOARD 7.5X6 YLW CONV (MISCELLANEOUS) ×6 IMPLANT
PROSTHESIS VASCULAR GELW 34MM (Vascular Products) IMPLANT
SEALANT SURG COSEAL 8ML (VASCULAR PRODUCTS) ×7 IMPLANT
SET CARDIOPLEGIA MPS 5001102 (MISCELLANEOUS) ×2 IMPLANT
SPONGE GAUZE 4X4 12PLY STER LF (GAUZE/BANDAGES/DRESSINGS) ×4 IMPLANT
SPONGE LAP 18X18 X RAY DECT (DISPOSABLE) IMPLANT
SPONGE LAP 4X18 X RAY DECT (DISPOSABLE) ×2 IMPLANT
STAPLER VISISTAT 35W (STAPLE) ×3 IMPLANT
STOPCOCK 4 WAY LG BORE MALE ST (IV SETS) IMPLANT
SUT ETHIBOND 2 0 SH (SUTURE) ×6 IMPLANT
SUT MNCRL AB 3-0 PS2 18 (SUTURE) IMPLANT
SUT PROLENE 3 0 RB 1 (SUTURE) ×3 IMPLANT
SUT PROLENE 3 0 SH DA (SUTURE) ×5 IMPLANT
SUT PROLENE 3 0 SH1 36 (SUTURE) ×2 IMPLANT
SUT PROLENE 4 0 RB 1 (SUTURE) ×51
SUT PROLENE 4 0 SH DA (SUTURE) ×28 IMPLANT
SUT PROLENE 4-0 RB1 .5 CRCL 36 (SUTURE) IMPLANT
SUT PROLENE 4-0 RB1 18X2 ARM (SUTURE) IMPLANT
SUT PROLENE 5 0 C 1 36 (SUTURE) ×20 IMPLANT
SUT PROLENE 6 0 C 1 30 (SUTURE) ×20 IMPLANT
SUT PROLENE 6 0 CC (SUTURE) IMPLANT
SUT PROLENE 7 0 BV 1 (SUTURE) IMPLANT
SUT PROLENE 7 0 BV1 MDA (SUTURE) IMPLANT
SUT SILK 2 0 SH CR/8 (SUTURE) ×2 IMPLANT
SUT STEEL STERNAL CCS#1 18IN (SUTURE) ×2 IMPLANT
SUT STEEL SZ 6 DBL 3X14 BALL (SUTURE) ×2 IMPLANT
SUT VIC AB 1 CT1 18XCR BRD 8 (SUTURE) IMPLANT
SUT VIC AB 1 CT1 8-18 (SUTURE)
SUT VIC AB 1 CTX 27 (SUTURE) ×6 IMPLANT
SUT VIC AB 2-0 CT1 27 (SUTURE) ×3
SUT VIC AB 2-0 CT1 TAPERPNT 27 (SUTURE) IMPLANT
SUT VIC AB 2-0 CTX 36 (SUTURE) ×6 IMPLANT
SUT VIC AB 3-0 SH 27 (SUTURE)
SUT VIC AB 3-0 SH 27X BRD (SUTURE) IMPLANT
SUT VIC AB 3-0 X1 27 (SUTURE) ×8 IMPLANT
SUT VICRYL 4-0 PS2 18IN ABS (SUTURE) IMPLANT
SUTURE E-PAK OPEN HEART (SUTURE) ×3 IMPLANT
SYSTEM SAHARA CHEST DRAIN ATS (WOUND CARE) ×3 IMPLANT
TAPE CLOTH SURG 4X10 WHT LF (GAUZE/BANDAGES/DRESSINGS) ×2 IMPLANT
TAPE CLOTH SURG 6X10 WHT LF (GAUZE/BANDAGES/DRESSINGS) ×2 IMPLANT
TAPE PAPER 2X10 WHT MICROPORE (GAUZE/BANDAGES/DRESSINGS) ×2 IMPLANT
TOWEL OR 17X24 6PK STRL BLUE (TOWEL DISPOSABLE) ×3 IMPLANT
TOWEL OR 17X26 10 PK STRL BLUE (TOWEL DISPOSABLE) ×3 IMPLANT
TRAY CATH LUMEN 1 20CM STRL (SET/KITS/TRAYS/PACK) IMPLANT
TRAY FOLEY IC TEMP SENS 14FR (CATHETERS) ×3 IMPLANT
TUBE SUCT INTRACARD DLP 20F (MISCELLANEOUS) ×2 IMPLANT
VASCULAR PROSTHESIS GELW 34MM (Vascular Products) ×3 IMPLANT
VENT LEFT HEART 12002 (CATHETERS) ×3
WATER STERILE IRR 1000ML POUR (IV SOLUTION) ×6 IMPLANT

## 2015-08-02 NOTE — ED Notes (Signed)
Sudden onset of central CP at 1400; radiated to left jaw; diaphoretic. Given 324mg  ASA in route. Reports pain 6/10 on arrival.

## 2015-08-02 NOTE — ED Provider Notes (Signed)
CSN: [id number]     Arrival date & time 08/02/15  1659 History   First MD Initiated Contact with Patient 08/02/15 1708     Chief Complaint  Patient presents with  . Chest Pain     (Consider location/radiation/quality/duration/timing/severity/associated sxs/prior Treatment) HPI Comments: Patient with a history of hypertension, hyperlipidemia and past CVA presents with chest pain. He states that about 2 hours ago he noted some heavy severe chest pain to the service chest.  It radiates to his jaw and a little bit to his back. It also radiated down to his upper abdomen. He had some mild shortness of breath. There is some associated nausea but no vomiting. No diaphoresis. He had a similar episode about a week ago that self resolved. He states his pain is improved but he still has some minor discomfort to the center of his chest. 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 He did note some swelling of both of his legs, slightly greater on the left about 3 weeks ago. He states the swelling has resolved since that time. He denies any pleuritic-type chest pain. There is no cough or chest congestion. He's nonsmoker. There is no family history of early heart disease.  Patient is a 59 y.o. male presenting with chest pain.  Chest Pain Associated symptoms: nausea   Associated symptoms: no abdominal pain, no back pain, no cough, no diaphoresis, no dizziness, no fatigue, no fever, no headache, no numbness, no shortness of breath, not vomiting and no weakness     Past Medical History  Diagnosis Date  . 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 HYPERLIPIDEMIA 06/08/2007  . HYPOGONADISM, MALE 10/10/2007  . INSOMNIA-SLEEP DISORDER-UNSPEC 10/10/2007  . LOW BACK PAIN 06/08/2007  . WEAKNESS, LEFT SIDE OF BODY 10/10/2007  . Hypertension    Past Surgical History  Procedure Laterality Date  . Achilles    . Abdominal surgery     History reviewed.  No pertinent family history. Social History  Substance Use Topics  . Smoking status: Never Smoker   . Smokeless tobacco: None  . Alcohol Use: No    Review of Systems  Constitutional: Negative for fever, chills, diaphoresis and fatigue.  HENT: Negative for congestion, rhinorrhea and sneezing.   Eyes: Negative.   Respiratory: Positive for chest tightness. Negative for cough and shortness of breath.   Cardiovascular: Positive for chest pain and leg swelling.  Gastrointestinal: Positive for nausea. Negative for vomiting, abdominal pain, diarrhea and blood in stool.  Genitourinary: Negative for frequency, hematuria, flank pain and difficulty urinating.  Musculoskeletal: Negative for back pain and arthralgias.  Skin: Negative for rash.  Neurological: Negative for dizziness, speech difficulty, weakness, numbness and headaches.      Allergies  Review of patient's allergies indicates no known allergies.  Home Medications   Prior to Admission medications   Medication Sig Start Date End Date Taking? Authorizing Provider  acetaminophen (TYLENOL) 500 MG tablet Take 1,000 mg by mouth every 6 (six) hours as needed for moderate pain.   Yes Historical Provider, MD  aspirin 325 MG tablet Take 325 mg by mouth every 6 (six) hours as needed for moderate pain.   Yes Historical Provider, MD  ferrous sulfate 325 (65 FE) MG EC tablet Take 325 mg by mouth daily with breakfast.   Yes Historical Provider, MD  lisinopril (PRINIVIL,ZESTRIL) 10 MG tablet Take 5 mg by mouth daily.   Yes Historical Provider, MD  Magnesium 250 MG TABS Take 250 mg by mouth daily.   Yes Historical Provider, MD  Melatonin 3 MG TABS Take 6-9 mg by mouth at bedtime as needed (FOR SLEEP).   Yes Historical Provider, MD  mesalamine (APRISO) 0.375 G 24 hr capsule Take 1,500 mg by mouth daily.   Yes Historical Provider, MD  Multiple Vitamin (MULTIVITAMIN WITH MINERALS) TABS tablet Take 1 tablet by mouth daily.   Yes Historical Provider, MD   OVER THE COUNTER MEDICATION Take 1 tablet by mouth. ZANTREX 3 FOR WHEN DRIVING   Yes Historical Provider, MD  Potassium 99 MG TABS Take 99 mg by mouth daily.   Yes Historical Provider, MD  predniSONE (DELTASONE) 20 MG tablet Take 30 mg by mouth daily with breakfast.   Yes Historical Provider, MD   BP 149/68 mmHg  Pulse 77  Temp(Src) 98.3 F (36.8 C) (Oral)  Resp 24  Ht 6' (1.829 m)  Wt 195 lb (88.451 kg)  BMI 26.44 kg/m2  SpO2 100% Physical Exam  Constitutional: He is oriented to person, place, and time. He appears well-developed and well-nourished.  HENT:  Head: Normocephalic and atraumatic.  Eyes: Pupils are equal, round, and reactive to light.  Neck: Normal range of motion. Neck supple.  Cardiovascular: Normal rate and regular rhythm.   Murmur heard. Pulmonary/Chest: Effort normal and breath sounds normal. No respiratory distress. He has no wheezes. He has no rales. He exhibits no tenderness.  Abdominal: Soft. Bowel sounds are normal. There is no tenderness. There is no rebound and no guarding.  Musculoskeletal: Normal range of motion. He exhibits no edema.  No edema or calf tenderness  Lymphadenopathy:    He has no cervical adenopathy.  Neurological: He is alert and oriented to person, place, and time.  Skin: Skin is warm and dry. No rash noted.  Psychiatric: He has a normal mood and affect.    ED Course  Procedures (including critical care time) Labs Review Labs Reviewed  BASIC METABOLIC PANEL - Abnormal; Notable for the following:    Glucose, Bld 112 (*)    All other components within normal limits  CBC - Abnormal; Notable for the following:    RBC 3.56 (*)    Hemoglobin 10.9 (*)    HCT 34.6 (*)    RDW 16.3 (*)    All other components within normal limits  D-DIMER, QUANTITATIVE (NOT AT [facility]) - Abnormal; Notable for the following:    D-Dimer, Quant 7.90 (*)    All other components within normal limits  PROTIME-INR  APTT  I-STAT TROPOININ, ED  I-STAT  TROPOININ, ED  TYPE AND SCREEN    Imaging Review Ct Angio Chest Pe W/cm &/or Wo Cm  08/02/2015  CLINICAL DATA:  59 yr old male with chest pain radiating to left side of jaw. Diaphoretic. Had about a week ago but didn't seek medical attention. No known heart problems. EXAM: CT ANGIOGRAPHY CHEST WITH CONTRAST TECHNIQUE: Multidetector CT imaging of the chest was performed using the standard protocol during bolus administration of intravenous contrast. Multiplanar CT image reconstructions and MIPs were obtained to evaluate the vascular anatomy. CONTRAST:  OMNIPAQUE IOHEXOL 350 MG/ML SOLN COMPARISON:  Current chest radiographs. FINDINGS: Angiographic study: No evidence of a pulmonary embolus. Aorta is not opacified. It is dilated. Ascending aorta measures 4.7 cm in diameter approximately 4 cm above the aortic valve and 5.2 cm at the aortic root. Aortic branch vessels are normal in caliber. Thoracic inlet:  No mass or adenopathy.  Thyroid unremarkable. Mediastinum and hila: Heart mildly enlarged. No mediastinal or hilar masses or pathologically enlarged lymph nodes. Lungs and pleura: Mild dependent lower lobe subsegmental atelectasis. No evidence of pneumonia or edema. No mass or suspicious nodule. No pleural effusion or pneumothorax. Limited upper abdomen: Probable small cyst from the upper pole left kidney. Otherwise unremarkable. Musculoskeletal: Mild degenerative spurring noted along the thoracic spine. No osteoblastic or osteolytic lesions. Review of the MIP images confirms the above findings. IMPRESSION: 1. No evidence of a pulmonary embolus. 2. 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 3. No acute findings. Lungs are clear other than mild dependent subsegmental atelectasis. Electronically Signed   By: Amie Portland M.D.   On: 08/02/2015 20:42   Dg Chest Port 1 View  08/02/2015  CLINICAL DATA:  Chest pain starting today EXAM: PORTABLE CHEST 1 VIEW  COMPARISON:  08/25/2007 FINDINGS: The heart size and mediastinal contours are within normal limits. Both lungs are clear. The visualized skeletal structures are unremarkable. IMPRESSION: No active disease. Electronically Signed   By: Natasha Mead M.D.   On: 08/02/2015 17:36   I have personally reviewed and evaluated these images and lab results as part of my medical decision-making.   EKG Interpretation   Date/Time:  Sunday August 02 2015 17:46:10 EDT Ventricular Rate:  68 PR Interval:  165 QRS Duration: 85 QT Interval:  380 QTC Calculation: 404 R Axis:   29 Text Interpretation:  Sinus rhythm Borderline T abnormalities, lateral  leads similar to prior EKG from same day Confirmed by Davey Bergsma  MD, Yarissa Reining  ([id number]) on 08/02/2015 8:24:22 PM      MDM   Final diagnoses:  Thoracic aortic dissection [facility])    Patient presents with pain to the mid chest area. He does not want anything for the pain. He states it feels low bit better but is still there. He does not want to try nitroglycerin. He doesn't have any definite ischemic changes on EKG. There some slight difference in the ST segments anteriorly.  His troponin is negative. His d-dimer was elevated and a CT scan was performed. On the CT scan, patient was found to have a large thoracic aneurysm. I consulted Dr. Dorris Fetch with cardiothoracic surgery. He is coming to evaluate the patient. He is requesting a stat TEE. I consulted cardiology who will arrange for the echo.  Pt had delay in getting ECHO as the tech never got paged, once ECHO done, showed +evidence of dissection.  Dr. Dorris Fetch to take pt to OR.  CRITICAL CARE Performed by: Xinyi Batton Total critical care time: 60 Critical care time was exclusive of separately billable procedures and treating other patients. Critical care was necessary to treat or prevent imminent or life-threatening deterioration. Critical care was time spent personally by me on the following activities:  development of treatment plan with patient and/or surrogate as well as nursing, discussions with consultants, evaluation of patient's response to treatment, examination of patient, obtaining history from patient or surrogate, ordering and performing treatments and interventions, ordering and review of laboratory studies, ordering and review of radiographic studies, pulse oximetry and re-evaluation of patient's condition.   Rolan Bucco, MD 08/02/15 [phone]

## 2015-08-02 NOTE — ED Notes (Signed)
Belfi, MD at bedside.  

## 2015-08-02 NOTE — Progress Notes (Signed)
  Echocardiogram 2D Echocardiogram limited has been performed.  Ryan Cole, Ryan Cole 08/02/2015, 11:30 PM

## 2015-08-02 NOTE — Consult Note (Addendum)
Reason for Consult: Chest pain and Aortic aneurysm Referring Physician: Dr. Tamera Punt- Ed  Ryan Cole is an 60 y.o. male.  HPI: 59 yo man with a PMH significant for hypertension, hyperlipidemia, CVA in 2008, and ulcerative colitis with a recent flare.  He presented today with a cc/o CP. He had an episode about 10 days ago of chest tightness radiating to his neck and abdomen. This afternoon he had sudden onset of substernal chest tightness which radiated to his neck, head, arms and stomach. He says this was about 9/10. He came to the ED. His pain had eased off a little after arrival. His ECG showed some borderline lateral T wave changes. Troponin was negative. His d dimer was elevated. A CT to r/o PE showed a 4.6 cm ascending aneurysm, possibly as large as 5.2 cm in the root.  He is still having pain but it has eased off significantly.  He has UC and has had a recent flare treated with steroids and mesalamine.  Past Medical History  Diagnosis Date  . 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 HYPERLIPIDEMIA 06/08/2007  . HYPOGONADISM, MALE 10/10/2007  . INSOMNIA-SLEEP DISORDER-UNSPEC 10/10/2007  . LOW BACK PAIN 06/08/2007  . WEAKNESS, LEFT SIDE OF BODY 10/10/2007  . Hypertension     Past Surgical History  Procedure Laterality Date  . Achilles    . Abdominal surgery      History reviewed. No pertinent family history.  Social History:  reports that he has never smoked. He does not have any smokeless tobacco history on file. He reports that he does not drink alcohol or use illicit drugs.  Allergies: No Known Allergies  Medications: Prior to Admission:  Acetaminophen (TYLENOL) 500 MG tablet Take 1,000 mg by mouth every 6 (six) hours as needed Aspirin 325 MG tablet Take 325 mg by mouth every 6 (six) hours as needed for moderate pain. Ferrous sulfate 325 (65 FE) MG EC tablet Take 325 mg by mouth daily with breakfast.  Lisinopril (PRINIVIL,ZESTRIL) 10 MG tablet  Take 5 mg by mouth daily. Magnesium 250 MG TABS Take 250 mg by mouth daily. Melatonin 3 MG TABS Take 6-9 mg by mouth at bedtime as needed (FOR SLEEP). Mesalamine (APRISO) 0.375 G 24 hr capsule Take 1,500 mg by mouth daily. Multiple Vitamin (MULTIVITAMIN WITH MINERALS) TABS tablet Take 1 tablet by mouth daily.  ZANTREX 3 FOR WHEN DRIVING  Potassium 99 MG TABS Take 99 mg by mouth daily PredniSONE (DELTASONE) 20 MG tablet Take 30 mg by mouth daily with breakfast.  Results for orders placed or performed during the hospital encounter of 08/02/15 (from the past 48 hour(s))  I-stat troponin, ED     Status: None   Collection Time: 08/02/15  6:00 PM  Result Value Ref Range   Troponin i, poc 0.00 0.00 - 0.08 ng/mL   Comment 3            Comment: Due to the release kinetics of cTnI, a negative result within the first hours of the onset of symptoms does not rule out myocardial infarction with certainty. If myocardial infarction is still suspected, repeat the test at appropriate intervals.   Basic metabolic panel     Status: Abnormal   Collection Time: 08/02/15  6:06 PM  Result Value Ref Range   Sodium 136 135 - 145 mmol/L   Potassium 4.6 3.5 - 5.1 mmol/L   Chloride 102 101 - 111 mmol/L   CO2 26 22 - 32  mmol/L   Glucose, Bld 112 (H) 65 - 99 mg/dL   BUN 16 6 - 20 mg/dL   Creatinine, Ser 0.87 0.61 - 1.24 mg/dL   Calcium 9.0 8.9 - 10.3 mg/dL   GFR calc non Af Amer >60 >60 mL/min   GFR calc Af Amer >60 >60 mL/min    Comment: (NOTE) The eGFR has been calculated using the CKD EPI equation. This calculation has not been validated in all clinical situations. eGFR's persistently <60 mL/min signify possible Chronic Kidney Disease.    Anion gap 8 5 - 15  CBC     Status: Abnormal   Collection Time: 08/02/15  6:06 PM  Result Value Ref Range   WBC 6.1 4.0 - 10.5 K/uL   RBC 3.56 (L) 4.22 - 5.81 MIL/uL   Hemoglobin 10.9 (L) 13.0 - 17.0 g/dL   HCT 34.6 (L) 39.0 - 52.0 %   MCV 97.2 78.0 - 100.0 fL    MCH 30.6 26.0 - 34.0 pg   MCHC 31.5 30.0 - 36.0 g/dL   RDW 16.3 (H) 11.5 - 15.5 %   Platelets 204 150 - 400 K/uL  D-dimer, quantitative     Status: Abnormal   Collection Time: 08/02/15  6:06 PM  Result Value Ref Range   D-Dimer, Quant 7.90 (H) 0.00 - 0.48 ug/mL-FEU    Comment:        AT THE INHOUSE ESTABLISHED CUTOFF VALUE OF 0.48 ug/mL FEU, THIS ASSAY HAS BEEN DOCUMENTED IN THE LITERATURE TO HAVE A SENSITIVITY AND NEGATIVE PREDICTIVE VALUE OF AT LEAST 98 TO 99%.  THE TEST RESULT SHOULD BE CORRELATED WITH AN ASSESSMENT OF THE CLINICAL PROBABILITY OF DVT / VTE.     Ct Angio Chest Pe W/cm &/or Wo Cm  08/02/2015  CLINICAL DATA:  59 yr old male with chest pain radiating to left side of jaw. Diaphoretic. Had about a week ago but didn't seek medical attention. No known heart problems. EXAM: CT ANGIOGRAPHY CHEST WITH CONTRAST TECHNIQUE: Multidetector CT imaging of the chest was performed using the standard protocol during bolus administration of intravenous contrast. Multiplanar CT image reconstructions and MIPs were obtained to evaluate the vascular anatomy. CONTRAST:  140m OMNIPAQUE IOHEXOL 350 MG/ML SOLN COMPARISON:  Current chest radiographs. FINDINGS: Angiographic study: No evidence of a pulmonary embolus. Aorta is not opacified. It is dilated. Ascending aorta measures 4.7 cm in diameter approximately 4 cm above the aortic valve and 5.2 cm at the aortic root. Aortic branch vessels are normal in caliber. Thoracic inlet:  No mass or adenopathy.  Thyroid unremarkable. Mediastinum and hila: Heart mildly enlarged. No mediastinal or hilar masses or pathologically enlarged lymph nodes. Lungs and pleura: Mild dependent lower lobe subsegmental atelectasis. No evidence of pneumonia or edema. No mass or suspicious nodule. No pleural effusion or pneumothorax. Limited upper abdomen: Probable small cyst from the upper pole left kidney. Otherwise unremarkable. Musculoskeletal: Mild degenerative spurring  noted along the thoracic spine. No osteoblastic or osteolytic lesions. Review of the MIP images confirms the above findings. IMPRESSION: 1. No evidence of a pulmonary embolus. 2. 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 3. No acute findings. Lungs are clear other than mild dependent subsegmental atelectasis. Electronically Signed   By: DLajean ManesM.D.   On: 08/02/2015 20:42   Dg Chest Port 1 View  08/02/2015  CLINICAL DATA:  Chest pain starting today EXAM: PORTABLE CHEST 1 VIEW COMPARISON:  08/25/2007 FINDINGS: The heart size  and mediastinal contours are within normal limits. Both lungs are clear. The visualized skeletal structures are unremarkable. IMPRESSION: No active disease. Electronically Signed   By: Lahoma Crocker M.D.   On: 08/02/2015 17:36    Review of Systems  Constitutional: Positive for malaise/fatigue. Negative for fever and chills.  Respiratory: Negative for shortness of breath.   Cardiovascular: Positive for chest pain.       Denies murmur  Gastrointestinal: Positive for nausea, abdominal pain and diarrhea.  Neurological: Negative for speech change, focal weakness, seizures and loss of consciousness.   Blood pressure 164/46, pulse 80, temperature 98.3 F (36.8 C), temperature source Oral, resp. rate 28, height 6' (1.829 m), weight 195 lb (88.451 kg), SpO2 100 %. Physical Exam  Vitals reviewed. Constitutional: He is oriented to person, place, and time. He appears well-developed and well-nourished. He appears distressed (mildly).  Neck: Neck supple. No thyromegaly present.  Bilateral bruit v transmitted murmur  Cardiovascular: Normal rate, regular rhythm and intact distal pulses.   Murmur (3/6 systolic and diastolic) heard. Respiratory: Effort normal and breath sounds normal. He has no wheezes. He has no rales.  GI: Soft. There is no tenderness.  Musculoskeletal: He exhibits no edema.  Lymphadenopathy:    He has no cervical  adenopathy.  Neurological: He is alert and oriented to person, place, and time. No cranial nerve deficit.  Motor intact  Skin: Skin is warm and dry.    Assessment/Plan:  I personally reviewed the CT chest and did so again with radiology. There is a definite ascending aneurysm. There is no way to rule a dissection in or out based on these films.  59 yo man with a history of ulcerative colitis, hypertension and prior CVA with no residual deficit. He presents with atypical chest pain after having a similar episode about a week and a half ago. He still has pain but refused morphine and nitroglycerin when offered. His d-dimer was elevated so a CT was done to r/o PE. There was no PE but he does have an ascending aneurysm. He also has a murmur, of which he has no prior knowledge. His symptoms would be unusual for an aortic dissection but it is not by any means ruled out at this point. Other possibilities include angina and reflux, but a dissection needs to ruled out.  Will do a TTE to evaluate the aortic valve and assess the ascending aorta. Dr. Tamera Punt has contacted Cardiology and the tech is on the way.  BP control.  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  He needs emergent operative repair. Will require replacement of aortic root, ascending aorta +/- arch. Will need deep hypothermic circ arrest.  I discussed the general nature of the procedure, the need for general anesthesia, and the incisions to be used with Ryan Cole. I reviewed the indications, risks, benefits and alternatives. They understand the risks include, but are not limited to death, stroke or other neurologic dysfunction, MI, DVT/PE, bleeding, need for transfusion, infections, and other organ system dysfunction including respiratory, renal, or GI complications. He accepts the risks and agrees to proceed.  The OR has been notified  and is setting up. Patient has been sent for.  Revonda Standard Roxan Hockey, MD [facility] [phone]

## 2015-08-02 NOTE — ED Notes (Signed)
Patient transported to CT 

## 2015-08-03 ENCOUNTER — Inpatient Hospital Stay (HOSPITAL_COMMUNITY): Payer: Non-veteran care

## 2015-08-03 ENCOUNTER — Emergency Department (HOSPITAL_COMMUNITY): Payer: Non-veteran care

## 2015-08-03 ENCOUNTER — Encounter (HOSPITAL_COMMUNITY): Payer: Self-pay | Admitting: Thoracic Surgery (Cardiothoracic Vascular Surgery)

## 2015-08-03 DIAGNOSIS — I5021 Acute systolic (congestive) heart failure: Secondary | ICD-10-CM | POA: Diagnosis not present

## 2015-08-03 DIAGNOSIS — I7101 Dissection of thoracic aorta: Secondary | ICD-10-CM | POA: Diagnosis not present

## 2015-08-03 DIAGNOSIS — I71019 Dissection of thoracic aorta, unspecified: Secondary | ICD-10-CM | POA: Diagnosis present

## 2015-08-03 DIAGNOSIS — I214 Non-ST elevation (NSTEMI) myocardial infarction: Secondary | ICD-10-CM | POA: Diagnosis not present

## 2015-08-03 DIAGNOSIS — D65 Disseminated intravascular coagulation [defibrination syndrome]: Secondary | ICD-10-CM | POA: Diagnosis not present

## 2015-08-03 LAB — PREPARE FRESH FROZEN PLASMA
UNIT DIVISION: 0
Unit division: 0
Unit division: 0
Unit division: 0

## 2015-08-03 LAB — POCT I-STAT, CHEM 8
BUN: 14 mg/dL (ref 6–20)
BUN: 14 mg/dL (ref 6–20)
BUN: 14 mg/dL (ref 6–20)
BUN: 15 mg/dL (ref 6–20)
BUN: 17 mg/dL (ref 6–20)
BUN: 17 mg/dL (ref 6–20)
BUN: 17 mg/dL (ref 6–20)
BUN: 17 mg/dL (ref 6–20)
CHLORIDE: 101 mmol/L (ref 101–111)
CHLORIDE: 103 mmol/L (ref 101–111)
CHLORIDE: 103 mmol/L (ref 101–111)
CHLORIDE: 104 mmol/L (ref 101–111)
CHLORIDE: 106 mmol/L (ref 101–111)
CREATININE: 0.6 mg/dL — AB (ref 0.61–1.24)
CREATININE: 0.7 mg/dL (ref 0.61–1.24)
CREATININE: 0.8 mg/dL (ref 0.61–1.24)
CREATININE: 0.8 mg/dL (ref 0.61–1.24)
Calcium, Ion: 0.97 mmol/L — ABNORMAL LOW (ref 1.12–1.23)
Calcium, Ion: 1 mmol/L — ABNORMAL LOW (ref 1.12–1.23)
Calcium, Ion: 1.04 mmol/L — ABNORMAL LOW (ref 1.12–1.23)
Calcium, Ion: 1.14 mmol/L (ref 1.12–1.23)
Calcium, Ion: 1.17 mmol/L (ref 1.12–1.23)
Calcium, Ion: 1.24 mmol/L — ABNORMAL HIGH (ref 1.12–1.23)
Calcium, Ion: 1.25 mmol/L — ABNORMAL HIGH (ref 1.12–1.23)
Calcium, Ion: 1.22 mmol/L (ref 1.12–1.23)
Chloride: 104 mmol/L (ref 101–111)
Chloride: 105 mmol/L (ref 101–111)
Chloride: 104 mmol/L (ref 101–111)
Creatinine, Ser: 0.7 mg/dL (ref 0.61–1.24)
Creatinine, Ser: 0.8 mg/dL (ref 0.61–1.24)
Creatinine, Ser: 1 mg/dL (ref 0.61–1.24)
Creatinine, Ser: 1 mg/dL (ref 0.61–1.24)
GLUCOSE: 100 mg/dL — AB (ref 65–99)
GLUCOSE: 181 mg/dL — AB (ref 65–99)
GLUCOSE: 209 mg/dL — AB (ref 65–99)
GLUCOSE: 118 mg/dL — AB (ref 65–99)
Glucose, Bld: 103 mg/dL — ABNORMAL HIGH (ref 65–99)
Glucose, Bld: 103 mg/dL — ABNORMAL HIGH (ref 65–99)
Glucose, Bld: 152 mg/dL — ABNORMAL HIGH (ref 65–99)
Glucose, Bld: 212 mg/dL — ABNORMAL HIGH (ref 65–99)
HCT: 26 % — ABNORMAL LOW (ref 39.0–52.0)
HCT: 24 % — ABNORMAL LOW (ref 39.0–52.0)
HCT: 18 % — ABNORMAL LOW (ref 39.0–52.0)
HEMATOCRIT: 26 % — AB (ref 39.0–52.0)
HEMATOCRIT: 26 % — AB (ref 39.0–52.0)
HEMATOCRIT: 27 % — AB (ref 39.0–52.0)
HEMATOCRIT: 30 % — AB (ref 39.0–52.0)
HEMATOCRIT: 32 % — AB (ref 39.0–52.0)
HEMOGLOBIN: 8.8 g/dL — AB (ref 13.0–17.0)
HEMOGLOBIN: 8.8 g/dL — AB (ref 13.0–17.0)
HEMOGLOBIN: 8.8 g/dL — AB (ref 13.0–17.0)
HEMOGLOBIN: 8.2 g/dL — AB (ref 13.0–17.0)
Hemoglobin: 10.2 g/dL — ABNORMAL LOW (ref 13.0–17.0)
Hemoglobin: 10.9 g/dL — ABNORMAL LOW (ref 13.0–17.0)
Hemoglobin: 9.2 g/dL — ABNORMAL LOW (ref 13.0–17.0)
Hemoglobin: 6.1 g/dL — CL (ref 13.0–17.0)
POTASSIUM: 3.5 mmol/L (ref 3.5–5.1)
POTASSIUM: 3.8 mmol/L (ref 3.5–5.1)
POTASSIUM: 4 mmol/L (ref 3.5–5.1)
POTASSIUM: 4.3 mmol/L (ref 3.5–5.1)
POTASSIUM: 4.7 mmol/L (ref 3.5–5.1)
POTASSIUM: 6.1 mmol/L — AB (ref 3.5–5.1)
POTASSIUM: 6.2 mmol/L — AB (ref 3.5–5.1)
Potassium: 4.6 mmol/L (ref 3.5–5.1)
SODIUM: 134 mmol/L — AB (ref 135–145)
SODIUM: 137 mmol/L (ref 135–145)
SODIUM: 138 mmol/L (ref 135–145)
SODIUM: 138 mmol/L (ref 135–145)
Sodium: 137 mmol/L (ref 135–145)
Sodium: 138 mmol/L (ref 135–145)
Sodium: 137 mmol/L (ref 135–145)
Sodium: 139 mmol/L (ref 135–145)
TCO2: 29 mmol/L (ref 0–100)
TCO2: 25 mmol/L (ref 0–100)
TCO2: 24 mmol/L (ref 0–100)
TCO2: 23 mmol/L (ref 0–100)
TCO2: 24 mmol/L (ref 0–100)
TCO2: 28 mmol/L (ref 0–100)
TCO2: 29 mmol/L (ref 0–100)
TCO2: 24 mmol/L (ref 0–100)

## 2015-08-03 LAB — GLUCOSE, CAPILLARY
GLUCOSE-CAPILLARY: 110 mg/dL — AB (ref 65–99)
GLUCOSE-CAPILLARY: 123 mg/dL — AB (ref 65–99)
GLUCOSE-CAPILLARY: 120 mg/dL — AB (ref 65–99)
GLUCOSE-CAPILLARY: 98 mg/dL (ref 65–99)
Glucose-Capillary: 151 mg/dL — ABNORMAL HIGH (ref 65–99)
Glucose-Capillary: 126 mg/dL — ABNORMAL HIGH (ref 65–99)
Glucose-Capillary: 114 mg/dL — ABNORMAL HIGH (ref 65–99)
Glucose-Capillary: 98 mg/dL (ref 65–99)
Glucose-Capillary: 102 mg/dL — ABNORMAL HIGH (ref 65–99)

## 2015-08-03 LAB — POCT I-STAT 3, ART BLOOD GAS (G3+)
ACID-BASE DEFICIT: 1 mmol/L (ref 0.0–2.0)
ACID-BASE EXCESS: 6 mmol/L — AB (ref 0.0–2.0)
Acid-Base Excess: 1 mmol/L (ref 0.0–2.0)
Acid-base deficit: 7 mmol/L — ABNORMAL HIGH (ref 0.0–2.0)
BICARBONATE: 30.5 meq/L — AB (ref 20.0–24.0)
BICARBONATE: 23.7 meq/L (ref 20.0–24.0)
BICARBONATE: 24.9 meq/L — AB (ref 20.0–24.0)
Bicarbonate: 24.8 mEq/L — ABNORMAL HIGH (ref 20.0–24.0)
Bicarbonate: 24.6 mEq/L — ABNORMAL HIGH (ref 20.0–24.0)
O2 SAT: 100 %
O2 Saturation: 100 %
O2 Saturation: 99 %
O2 Saturation: 100 %
O2 Saturation: 99 %
PCO2 ART: 79.5 mmHg — AB (ref 35.0–45.0)
PCO2 ART: 36.6 mmHg (ref 35.0–45.0)
PH ART: 7.439 (ref 7.350–7.450)
PH ART: 7.416 (ref 7.350–7.450)
PH ART: 7.44 (ref 7.350–7.450)
PO2 ART: 399 mmHg — AB (ref 80.0–100.0)
PO2 ART: 181 mmHg — AB (ref 80.0–100.0)
Patient temperature: 35.9
Patient temperature: 38.4
TCO2: 32 mmol/L (ref 0–100)
TCO2: 26 mmol/L (ref 0–100)
TCO2: 26 mmol/L (ref 0–100)
TCO2: 26 mmol/L (ref 0–100)
TCO2: 26 mmol/L (ref 0–100)
pCO2 arterial: 45 mmHg (ref 35.0–45.0)
pCO2 arterial: 44.2 mmHg (ref 35.0–45.0)
pCO2 arterial: 38.1 mmHg (ref 35.0–45.0)
pH, Arterial: 7.082 — CL (ref 7.350–7.450)
pH, Arterial: 7.359 (ref 7.350–7.450)
pO2, Arterial: 369 mmHg — ABNORMAL HIGH (ref 80.0–100.0)
pO2, Arterial: 164 mmHg — ABNORMAL HIGH (ref 80.0–100.0)
pO2, Arterial: 147 mmHg — ABNORMAL HIGH (ref 80.0–100.0)

## 2015-08-03 LAB — PREPARE RBC (CROSSMATCH)

## 2015-08-03 LAB — COMPREHENSIVE METABOLIC PANEL
ALBUMIN: 3 g/dL — AB (ref 3.5–5.0)
ALK PHOS: 31 U/L — AB (ref 38–126)
ALT: 69 U/L — ABNORMAL HIGH (ref 17–63)
ANION GAP: 5 (ref 5–15)
AST: 121 U/L — ABNORMAL HIGH (ref 15–41)
BUN: 14 mg/dL (ref 6–20)
CALCIUM: 7.8 mg/dL — AB (ref 8.9–10.3)
CHLORIDE: 109 mmol/L (ref 101–111)
CO2: 23 mmol/L (ref 22–32)
Creatinine, Ser: 1.03 mg/dL (ref 0.61–1.24)
GFR calc Af Amer: >60 mL/min (ref >60)
GFR calc non Af Amer: >60 mL/min (ref >60)
GLUCOSE: 105 mg/dL — AB (ref 65–99)
Potassium: 5.2 mmol/L — ABNORMAL HIGH (ref 3.5–5.1)
SODIUM: 137 mmol/L (ref 135–145)
Total Bilirubin: 0.7 mg/dL (ref 0.3–1.2)
Total Protein: 4.4 g/dL — ABNORMAL LOW (ref 6.5–8.1)

## 2015-08-03 LAB — CBC
HCT: 26.7 % — ABNORMAL LOW (ref 39.0–52.0)
HCT: 26.4 % — ABNORMAL LOW (ref 39.0–52.0)
HEMATOCRIT: 23.1 % — AB (ref 39.0–52.0)
HEMOGLOBIN: 8.9 g/dL — AB (ref 13.0–17.0)
Hemoglobin: 8.7 g/dL — ABNORMAL LOW (ref 13.0–17.0)
Hemoglobin: 7.5 g/dL — ABNORMAL LOW (ref 13.0–17.0)
MCH: 31 pg (ref 26.0–34.0)
MCH: 29.8 pg (ref 26.0–34.0)
MCH: 29.7 pg (ref 26.0–34.0)
MCHC: 32.6 g/dL (ref 30.0–36.0)
MCHC: 32.5 g/dL (ref 30.0–36.0)
MCHC: 33.7 g/dL (ref 30.0–36.0)
MCV: 95 fL (ref 78.0–100.0)
MCV: 91.7 fL (ref 78.0–100.0)
MCV: 88 fL (ref 78.0–100.0)
PLATELETS: 96 10*3/uL — AB (ref 150–400)
PLATELETS: 94 10*3/uL — AB (ref 150–400)
PLATELETS: 120 10*3/uL — AB (ref 150–400)
RBC: 2.81 MIL/uL — ABNORMAL LOW (ref 4.22–5.81)
RBC: 2.52 MIL/uL — ABNORMAL LOW (ref 4.22–5.81)
RBC: 3 MIL/uL — AB (ref 4.22–5.81)
RDW: 16.1 % — ABNORMAL HIGH (ref 11.5–15.5)
RDW: 16.1 % — AB (ref 11.5–15.5)
RDW: 16.6 % — ABNORMAL HIGH (ref 11.5–15.5)
WBC: 17.8 10*3/uL — ABNORMAL HIGH (ref 4.0–10.5)
WBC: 14 10*3/uL — AB (ref 4.0–10.5)
WBC: 12.6 10*3/uL — ABNORMAL HIGH (ref 4.0–10.5)

## 2015-08-03 LAB — HEMOGLOBIN AND HEMATOCRIT, BLOOD
HCT: 27.7 % — ABNORMAL LOW (ref 39.0–52.0)
Hemoglobin: 9 g/dL — ABNORMAL LOW (ref 13.0–17.0)

## 2015-08-03 LAB — DIC (DISSEMINATED INTRAVASCULAR COAGULATION)PANEL
INR: 0.91 (ref 0.00–1.49)
Platelets: 85 10*3/uL — ABNORMAL LOW (ref 150–400)
Prothrombin Time: 12.5 seconds (ref 11.6–15.2)
Smear Review: NONE SEEN
aPTT: 44 seconds — ABNORMAL HIGH (ref 24–37)
aPTT: 36 seconds (ref 24–37)

## 2015-08-03 LAB — DIC (DISSEMINATED INTRAVASCULAR COAGULATION) PANEL
D DIMER QUANT: 5.23 ug{FEU}/mL — AB (ref 0.00–0.48)
D DIMER QUANT: 2.02 ug{FEU}/mL — AB (ref 0.00–0.48)
FIBRINOGEN: 102 mg/dL — AB (ref 204–475)
FIBRINOGEN: 139 mg/dL — AB (ref 204–475)
INR: 2.12 — AB (ref 0.00–1.49)
PLATELETS: 99 10*3/uL — AB (ref 150–400)
PROTHROMBIN TIME: 23.6 s — AB (ref 11.6–15.2)
SMEAR REVIEW: NONE SEEN

## 2015-08-03 LAB — POCT I-STAT 4, (NA,K, GLUC, HGB,HCT)
GLUCOSE: 121 mg/dL — AB (ref 65–99)
HCT: 23 % — ABNORMAL LOW (ref 39.0–52.0)
Hemoglobin: 7.8 g/dL — ABNORMAL LOW (ref 13.0–17.0)
POTASSIUM: 4.6 mmol/L (ref 3.5–5.1)
SODIUM: 140 mmol/L (ref 135–145)

## 2015-08-03 LAB — MAGNESIUM: MAGNESIUM: 2.9 mg/dL — AB (ref 1.7–2.4)

## 2015-08-03 LAB — PROTIME-INR
INR: 1.78 — ABNORMAL HIGH (ref 0.00–1.49)
PROTHROMBIN TIME: 20.6 s — AB (ref 11.6–15.2)

## 2015-08-03 LAB — CREATININE, SERUM: CREATININE: 1.07 mg/dL (ref 0.61–1.24)

## 2015-08-03 LAB — ABO/RH: ABO/RH(D): O POS

## 2015-08-03 LAB — MRSA PCR SCREENING: MRSA by PCR: NEGATIVE

## 2015-08-03 LAB — PLATELET COUNT: Platelets: 121 10*3/uL — ABNORMAL LOW (ref 150–400)

## 2015-08-03 LAB — APTT: aPTT: 36 seconds (ref 24–37)

## 2015-08-03 LAB — FIBRINOGEN: FIBRINOGEN: 130 mg/dL — AB (ref 204–475)

## 2015-08-03 MED ORDER — NITROGLYCERIN IN D5W 200-5 MCG/ML-% IV SOLN: 0.0000 ug/min | INTRAVENOUS | Status: DC

## 2015-08-03 MED ORDER — HEPARIN SODIUM (PORCINE) 1000 UNIT/ML IJ SOLN
INTRAMUSCULAR | Status: DC | PRN
  Administered 2015-08-03: 5000 [IU] via INTRAVENOUS
  Administered 2015-08-03: 25000 [IU] via INTRAVENOUS

## 2015-08-03 MED ORDER — OXYCODONE HCL 5 MG PO TABS
5.0000 mg | ORAL_TABLET | ORAL | Status: DC | PRN
  Administered 2015-08-04 – 2015-08-05 (×3): 5 mg via ORAL
  Administered 2015-08-05: 10 mg via ORAL
  Administered 2015-08-06: 5 mg via ORAL
  Administered 2015-08-06 – 2015-08-07 (×3): 10 mg via ORAL
  Administered 2015-08-07: 5 mg via ORAL
  Filled 2015-08-03: qty 2
  Filled 2015-08-03: qty 1
  Filled 2015-08-03 (×2): qty 2
  Filled 2015-08-03 (×3): qty 1
  Filled 2015-08-03: qty 2
  Filled 2015-08-03: qty 1

## 2015-08-03 MED ORDER — SODIUM CHLORIDE 0.9 % IV SOLN: Freq: Once | INTRAVENOUS | Status: DC

## 2015-08-03 MED ORDER — INSULIN REGULAR BOLUS VIA INFUSION
0.0000 [IU] | Freq: Three times a day (TID) | INTRAVENOUS | Status: DC
  Filled 2015-08-03: qty 10

## 2015-08-03 MED ORDER — PROTAMINE SULFATE 10 MG/ML IV SOLN
25.0000 mg | Freq: Once | INTRAVENOUS | Status: AC
  Administered 2015-08-03: 25 mg via INTRAVENOUS

## 2015-08-03 MED ORDER — MORPHINE SULFATE (PF) 2 MG/ML IV SOLN
1.0000 mg | INTRAVENOUS | Status: AC | PRN
  Administered 2015-08-03 (×4): 4 mg via INTRAVENOUS
  Filled 2015-08-03 (×3): qty 2

## 2015-08-03 MED ORDER — LACTATED RINGERS IV SOLN: 500.0000 mL | Freq: Once | INTRAVENOUS | Status: DC | PRN

## 2015-08-03 MED ORDER — COAGULATION FACTOR VIIA RECOMB 1 MG IV SOLR
90.0000 ug/kg | Freq: Once | INTRAVENOUS | Status: AC
  Administered 2015-08-03: 8000 ug via INTRAVENOUS
  Filled 2015-08-03: qty 8

## 2015-08-03 MED ORDER — SODIUM CHLORIDE 0.9 % IV SOLN: 10.0000 mL/h | Freq: Once | INTRAVENOUS | Status: DC

## 2015-08-03 MED ORDER — SODIUM CHLORIDE 0.9 % IJ SOLN
3.0000 mL | Freq: Two times a day (BID) | INTRAMUSCULAR | Status: DC
  Administered 2015-08-04 – 2015-08-07 (×5): 3 mL via INTRAVENOUS

## 2015-08-03 MED ORDER — SODIUM CHLORIDE 0.9 % IV SOLN: 250.0000 mL | INTRAVENOUS | Status: DC

## 2015-08-03 MED ORDER — PROPOFOL 10 MG/ML IV BOLUS
INTRAVENOUS | Status: DC | PRN
  Administered 2015-08-03: 200 mg via INTRAVENOUS

## 2015-08-03 MED ORDER — FENTANYL CITRATE (PF) 250 MCG/5ML IJ SOLN
INTRAMUSCULAR | Status: AC
  Filled 2015-08-03: qty 5

## 2015-08-03 MED ORDER — BISACODYL 10 MG RE SUPP: 10.0000 mg | Freq: Every day | RECTAL | Status: DC

## 2015-08-03 MED ORDER — METHYLPREDNISOLONE SODIUM SUCC 125 MG IJ SOLR
INTRAMUSCULAR | Status: AC
  Filled 2015-08-03: qty 2

## 2015-08-03 MED ORDER — ASPIRIN 325 MG PO TABS
325.0000 mg | ORAL_TABLET | Freq: Four times a day (QID) | ORAL | Status: DC | PRN
  Administered 2015-08-08: 325 mg via ORAL
  Filled 2015-08-03 (×3): qty 1

## 2015-08-03 MED ORDER — PROTAMINE SULFATE 10 MG/ML IV SOLN
INTRAVENOUS | Status: AC
  Administered 2015-08-03: 11:00:00
  Filled 2015-08-03: qty 5

## 2015-08-03 MED ORDER — SODIUM CHLORIDE 0.45 % IV SOLN
INTRAVENOUS | Status: DC | PRN
  Administered 2015-08-03: 10:00:00 via INTRAVENOUS

## 2015-08-03 MED ORDER — PHENYLEPHRINE HCL 10 MG/ML IJ SOLN
0.0000 ug/min | INTRAMUSCULAR | Status: DC
  Filled 2015-08-03: qty 2

## 2015-08-03 MED ORDER — TRAMADOL HCL 50 MG PO TABS
50.0000 mg | ORAL_TABLET | ORAL | Status: DC | PRN
  Administered 2015-08-05: 100 mg via ORAL
  Administered 2015-08-06: 50 mg via ORAL
  Administered 2015-08-07 (×2): 100 mg via ORAL
  Filled 2015-08-03 (×2): qty 2
  Filled 2015-08-03: qty 1
  Filled 2015-08-03: qty 2

## 2015-08-03 MED ORDER — ETOMIDATE 2 MG/ML IV SOLN
INTRAVENOUS | Status: DC | PRN
  Administered 2015-08-02: 16 mg via INTRAVENOUS

## 2015-08-03 MED ORDER — LACTATED RINGERS IV SOLN: INTRAVENOUS | Status: DC

## 2015-08-03 MED ORDER — METHYLPREDNISOLONE SODIUM SUCC 125 MG IJ SOLR
INTRAMUSCULAR | Status: DC | PRN
Start: 2015-08-03 — End: 2015-08-03
  Administered 2015-08-03: 125 mg via INTRAVENOUS

## 2015-08-03 MED ORDER — SODIUM CHLORIDE 0.9 % IV SOLN
Freq: Once | INTRAVENOUS | Status: AC
  Administered 2015-08-03: 11:00:00 via INTRAVENOUS

## 2015-08-03 MED ORDER — 0.9 % SODIUM CHLORIDE (POUR BTL) OPTIME
TOPICAL | Status: DC | PRN
  Administered 2015-08-03: 2000 mL

## 2015-08-03 MED ORDER — HEMOSTATIC AGENTS (NO CHARGE) OPTIME
TOPICAL | Status: DC | PRN
  Administered 2015-08-03: 10 via TOPICAL

## 2015-08-03 MED ORDER — ARTIFICIAL TEARS OP OINT
TOPICAL_OINTMENT | OPHTHALMIC | Status: DC | PRN
  Administered 2015-08-02: 1 via OPHTHALMIC

## 2015-08-03 MED ORDER — ARTIFICIAL TEARS OP OINT
TOPICAL_OINTMENT | OPHTHALMIC | Status: AC
  Filled 2015-08-03: qty 3.5

## 2015-08-03 MED ORDER — MAGNESIUM SULFATE 4 GM/100ML IV SOLN
4.0000 g | Freq: Once | INTRAVENOUS | Status: AC
  Administered 2015-08-03: 4 g via INTRAVENOUS
  Filled 2015-08-03: qty 100

## 2015-08-03 MED ORDER — SODIUM CHLORIDE 0.9 % IV SOLN
Freq: Once | INTRAVENOUS | Status: AC
  Administered 2015-08-03: 10:00:00 via INTRAVENOUS

## 2015-08-03 MED ORDER — SODIUM CHLORIDE 0.9 % IJ SOLN: 3.0000 mL | INTRAMUSCULAR | Status: DC | PRN

## 2015-08-03 MED ORDER — INSULIN ASPART 100 UNIT/ML ~~LOC~~ SOLN
0.0000 [IU] | SUBCUTANEOUS | Status: DC
  Administered 2015-08-04 (×2): 2 [IU] via SUBCUTANEOUS

## 2015-08-03 MED ORDER — ALBUMIN HUMAN 5 % IV SOLN
250.0000 mL | INTRAVENOUS | Status: AC | PRN
  Administered 2015-08-03 (×4): 250 mL via INTRAVENOUS
  Filled 2015-08-03 (×2): qty 250

## 2015-08-03 MED ORDER — SODIUM CHLORIDE 0.9 % IJ SOLN
INTRAMUSCULAR | Status: AC
  Filled 2015-08-03: qty 30

## 2015-08-03 MED ORDER — CHLORHEXIDINE GLUCONATE 0.12% ORAL RINSE (MEDLINE KIT)
15.0000 mL | Freq: Two times a day (BID) | OROMUCOSAL | Status: DC
  Administered 2015-08-03 – 2015-08-04 (×3): 15 mL via OROMUCOSAL

## 2015-08-03 MED ORDER — DEXMEDETOMIDINE HCL IN NACL 400 MCG/100ML IV SOLN
0.4000 ug/kg/h | INTRAVENOUS | Status: DC
  Administered 2015-08-03 – 2015-08-04 (×2): 0.7 ug/kg/h via INTRAVENOUS
  Filled 2015-08-03 (×2): qty 100

## 2015-08-03 MED ORDER — HEMOSTATIC AGENTS (NO CHARGE) OPTIME
TOPICAL | Status: DC | PRN
  Administered 2015-08-03: 1 via TOPICAL

## 2015-08-03 MED ORDER — LIDOCAINE HCL (CARDIAC) 20 MG/ML IV SOLN
INTRAVENOUS | Status: DC | PRN
  Administered 2015-08-02: 60 mg via INTRAVENOUS

## 2015-08-03 MED ORDER — MIDAZOLAM HCL 5 MG/5ML IJ SOLN
INTRAMUSCULAR | Status: DC | PRN
  Administered 2015-08-02: 2 mg via INTRAVENOUS
  Administered 2015-08-03 (×2): 3 mg via INTRAVENOUS
  Administered 2015-08-03: 2 mg via INTRAVENOUS

## 2015-08-03 MED ORDER — ASPIRIN EC 325 MG PO TBEC
325.0000 mg | DELAYED_RELEASE_TABLET | Freq: Every day | ORAL | Status: DC
  Administered 2015-08-06 – 2015-08-07 (×2): 325 mg via ORAL
  Filled 2015-08-03 (×5): qty 1

## 2015-08-03 MED ORDER — ASPIRIN 81 MG PO CHEW: 324.0000 mg | CHEWABLE_TABLET | Freq: Every day | ORAL | Status: DC

## 2015-08-03 MED ORDER — HEPARIN SODIUM (PORCINE) 1000 UNIT/ML IJ SOLN
INTRAMUSCULAR | Status: AC
  Filled 2015-08-03: qty 1

## 2015-08-03 MED ORDER — PROTAMINE SULFATE 10 MG/ML IV SOLN
INTRAVENOUS | Status: DC | PRN
  Administered 2015-08-03: 10 mg via INTRAVENOUS
  Administered 2015-08-03: 100 mg via INTRAVENOUS
  Administered 2015-08-03: 90 mg via INTRAVENOUS
  Administered 2015-08-03: 50 mg via INTRAVENOUS

## 2015-08-03 MED ORDER — METOPROLOL TARTRATE 25 MG/10 ML ORAL SUSPENSION
12.5000 mg | Freq: Two times a day (BID) | ORAL | Status: DC
  Filled 2015-08-03 (×12): qty 5

## 2015-08-03 MED ORDER — POTASSIUM CHLORIDE 10 MEQ/50ML IV SOLN: 10.0000 meq | INTRAVENOUS | Status: AC

## 2015-08-03 MED ORDER — SODIUM CHLORIDE 0.9 % IV SOLN
1.0000 g | Freq: Once | INTRAVENOUS | Status: DC
  Filled 2015-08-03: qty 10

## 2015-08-03 MED ORDER — DOCUSATE SODIUM 100 MG PO CAPS
200.0000 mg | ORAL_CAPSULE | Freq: Every day | ORAL | Status: DC
  Administered 2015-08-05 – 2015-08-08 (×4): 200 mg via ORAL
  Filled 2015-08-03 (×4): qty 2

## 2015-08-03 MED ORDER — CALCIUM CHLORIDE 10 % IV SOLN
INTRAVENOUS | Status: DC | PRN
  Administered 2015-08-03: 200 mg via INTRAVENOUS
  Administered 2015-08-03: 300 mg via INTRAVENOUS

## 2015-08-03 MED ORDER — DEXTROSE 5 % IV SOLN
1.5000 g | Freq: Two times a day (BID) | INTRAVENOUS | Status: AC
  Administered 2015-08-03 – 2015-08-05 (×4): 1.5 g via INTRAVENOUS
  Filled 2015-08-03 (×4): qty 1.5

## 2015-08-03 MED ORDER — ETOMIDATE 2 MG/ML IV SOLN
INTRAVENOUS | Status: AC
  Filled 2015-08-03: qty 10

## 2015-08-03 MED ORDER — CALCIUM CHLORIDE 10 % IV SOLN
1.0000 g | Freq: Once | INTRAVENOUS | Status: AC
  Administered 2015-08-03: 1 g via INTRAVENOUS

## 2015-08-03 MED ORDER — NOREPINEPHRINE BITARTRATE 1 MG/ML IV SOLN
0.0000 ug/min | INTRAVENOUS | Status: DC
  Administered 2015-08-03: 3 ug/min via INTRAVENOUS
  Administered 2015-08-04: 5.5 ug/min via INTRAVENOUS
  Filled 2015-08-03 (×2): qty 4

## 2015-08-03 MED ORDER — DEXMEDETOMIDINE HCL IN NACL 200 MCG/50ML IV SOLN
0.0000 ug/kg/h | INTRAVENOUS | Status: DC
  Administered 2015-08-03 (×3): 0.7 ug/kg/h via INTRAVENOUS
  Filled 2015-08-03 (×3): qty 50

## 2015-08-03 MED ORDER — SODIUM CHLORIDE 0.9 % IV SOLN
INTRAVENOUS | Status: DC
  Administered 2015-08-03: 20:00:00 via INTRAVENOUS
  Filled 2015-08-03 (×2): qty 2.5

## 2015-08-03 MED ORDER — MIDAZOLAM HCL 2 MG/2ML IJ SOLN
2.0000 mg | INTRAMUSCULAR | Status: DC | PRN
  Administered 2015-08-03: 2 mg via INTRAVENOUS
  Filled 2015-08-03 (×2): qty 2

## 2015-08-03 MED ORDER — ACETAMINOPHEN 160 MG/5ML PO SOLN: 650.0000 mg | Freq: Once | ORAL | Status: AC

## 2015-08-03 MED ORDER — ONDANSETRON HCL 4 MG/2ML IJ SOLN: 4.0000 mg | Freq: Four times a day (QID) | INTRAMUSCULAR | Status: DC | PRN

## 2015-08-03 MED ORDER — PANTOPRAZOLE SODIUM 40 MG PO TBEC
40.0000 mg | DELAYED_RELEASE_TABLET | Freq: Every day | ORAL | Status: DC
  Administered 2015-08-05 – 2015-08-08 (×4): 40 mg via ORAL
  Filled 2015-08-03 (×4): qty 1

## 2015-08-03 MED ORDER — PROTAMINE SULFATE 10 MG/ML IV SOLN
INTRAVENOUS | Status: AC
  Filled 2015-08-03: qty 25

## 2015-08-03 MED ORDER — ACETAMINOPHEN 160 MG/5ML PO SOLN
1000.0000 mg | Freq: Four times a day (QID) | ORAL | Status: DC
  Administered 2015-08-03 – 2015-08-04 (×2): 1000 mg
  Filled 2015-08-03 (×2): qty 40.6

## 2015-08-03 MED ORDER — VECURONIUM BROMIDE 10 MG IV SOLR
INTRAVENOUS | Status: DC | PRN
  Administered 2015-08-03 (×2): 5 mg via INTRAVENOUS
  Administered 2015-08-03: 10 mg via INTRAVENOUS

## 2015-08-03 MED ORDER — EPINEPHRINE HCL 0.1 MG/ML IJ SOSY
PREFILLED_SYRINGE | INTRAMUSCULAR | Status: AC
  Filled 2015-08-03: qty 10

## 2015-08-03 MED ORDER — LACTATED RINGERS IV SOLN
INTRAVENOUS | Status: DC | PRN
  Administered 2015-08-02 – 2015-08-03 (×3): via INTRAVENOUS

## 2015-08-03 MED ORDER — METOPROLOL TARTRATE 12.5 MG HALF TABLET
12.5000 mg | ORAL_TABLET | Freq: Two times a day (BID) | ORAL | Status: DC
  Administered 2015-08-06 – 2015-08-08 (×4): 12.5 mg via ORAL
  Filled 2015-08-03 (×12): qty 1

## 2015-08-03 MED ORDER — LIDOCAINE HCL (CARDIAC) 20 MG/ML IV SOLN
INTRAVENOUS | Status: AC
  Filled 2015-08-03: qty 5

## 2015-08-03 MED ORDER — VECURONIUM BROMIDE 10 MG IV SOLR
INTRAVENOUS | Status: AC
  Filled 2015-08-03: qty 20

## 2015-08-03 MED ORDER — ACETAMINOPHEN 650 MG RE SUPP
650.0000 mg | Freq: Once | RECTAL | Status: AC
  Administered 2015-08-03: 650 mg via RECTAL

## 2015-08-03 MED ORDER — ALBUMIN HUMAN 5 % IV SOLN
INTRAVENOUS | Status: DC | PRN
  Administered 2015-08-03 (×2): via INTRAVENOUS

## 2015-08-03 MED ORDER — HYDROCORTISONE NA SUCCINATE PF 100 MG IJ SOLR
50.0000 mg | Freq: Three times a day (TID) | INTRAMUSCULAR | Status: AC
  Administered 2015-08-04 – 2015-08-05 (×3): 50 mg via INTRAVENOUS
  Filled 2015-08-03 (×3): qty 1

## 2015-08-03 MED ORDER — SODIUM CHLORIDE 0.9 % IV SOLN
INTRAVENOUS | Status: DC
  Administered 2015-08-03: 10:00:00 via INTRAVENOUS

## 2015-08-03 MED ORDER — MILRINONE IN DEXTROSE 20 MG/100ML IV SOLN
INTRAVENOUS | Status: AC
  Filled 2015-08-03: qty 100

## 2015-08-03 MED ORDER — FAMOTIDINE IN NACL 20-0.9 MG/50ML-% IV SOLN
20.0000 mg | Freq: Two times a day (BID) | INTRAVENOUS | Status: AC
  Administered 2015-08-03 (×2): 20 mg via INTRAVENOUS
  Filled 2015-08-03: qty 50

## 2015-08-03 MED ORDER — METOPROLOL TARTRATE 1 MG/ML IV SOLN: 2.5000 mg | INTRAVENOUS | Status: DC | PRN

## 2015-08-03 MED ORDER — BISACODYL 5 MG PO TBEC
10.0000 mg | DELAYED_RELEASE_TABLET | Freq: Every day | ORAL | Status: DC
  Administered 2015-08-05 – 2015-08-08 (×4): 10 mg via ORAL
  Filled 2015-08-03 (×4): qty 2

## 2015-08-03 MED ORDER — SODIUM CHLORIDE 0.9 % IV SOLN
10.0000 g | INTRAVENOUS | Status: DC | PRN
  Administered 2015-08-03: 5 g/h via INTRAVENOUS
  Administered 2015-08-03: 07:00:00 via INTRAVENOUS

## 2015-08-03 MED ORDER — MORPHINE SULFATE (PF) 2 MG/ML IV SOLN
2.0000 mg | INTRAVENOUS | Status: DC | PRN
  Administered 2015-08-03 – 2015-08-04 (×4): 4 mg via INTRAVENOUS
  Administered 2015-08-04: 2 mg via INTRAVENOUS
  Administered 2015-08-04 – 2015-08-05 (×2): 4 mg via INTRAVENOUS
  Administered 2015-08-05 – 2015-08-06 (×4): 2 mg via INTRAVENOUS
  Filled 2015-08-03: qty 2
  Filled 2015-08-03: qty 1
  Filled 2015-08-03 (×3): qty 2
  Filled 2015-08-03 (×3): qty 1
  Filled 2015-08-03: qty 2
  Filled 2015-08-03: qty 1
  Filled 2015-08-03 (×2): qty 2

## 2015-08-03 MED ORDER — ACETAMINOPHEN 500 MG PO TABS
1000.0000 mg | ORAL_TABLET | Freq: Four times a day (QID) | ORAL | Status: DC
  Administered 2015-08-04 – 2015-08-08 (×15): 1000 mg via ORAL
  Filled 2015-08-03 (×21): qty 2

## 2015-08-03 MED ORDER — HEMOSTATIC AGENTS (NO CHARGE) OPTIME
TOPICAL | Status: DC | PRN
  Administered 2015-08-03: 3 via TOPICAL

## 2015-08-03 MED ORDER — FENTANYL CITRATE (PF) 100 MCG/2ML IJ SOLN
INTRAMUSCULAR | Status: DC | PRN
  Administered 2015-08-02 – 2015-08-03 (×3): 250 ug via INTRAVENOUS
  Administered 2015-08-03: 100 ug via INTRAVENOUS
  Administered 2015-08-03: 150 ug via INTRAVENOUS
  Administered 2015-08-03 (×2): 250 ug via INTRAVENOUS

## 2015-08-03 MED ORDER — ROCURONIUM BROMIDE 50 MG/5ML IV SOLN
INTRAVENOUS | Status: AC
  Filled 2015-08-03: qty 2

## 2015-08-03 MED ORDER — HYDROCORTISONE NA SUCCINATE PF 100 MG IJ SOLR
100.0000 mg | Freq: Three times a day (TID) | INTRAMUSCULAR | Status: AC
  Administered 2015-08-03 – 2015-08-04 (×3): 100 mg via INTRAVENOUS
  Filled 2015-08-03 (×3): qty 2

## 2015-08-03 MED ORDER — ANTISEPTIC ORAL RINSE SOLUTION (CORINZ)
7.0000 mL | Freq: Four times a day (QID) | OROMUCOSAL | Status: DC
  Administered 2015-08-03 – 2015-08-06 (×10): 7 mL via OROMUCOSAL

## 2015-08-03 MED ORDER — DOPAMINE-DEXTROSE 3.2-5 MG/ML-% IV SOLN
0.0000 ug/kg/min | INTRAVENOUS | Status: DC
  Filled 2015-08-03: qty 250

## 2015-08-03 MED ORDER — VANCOMYCIN HCL IN DEXTROSE 1-5 GM/200ML-% IV SOLN
1000.0000 mg | Freq: Once | INTRAVENOUS | Status: AC
  Administered 2015-08-03: 1000 mg via INTRAVENOUS
  Filled 2015-08-03 (×2): qty 200

## 2015-08-03 MED ORDER — ROCURONIUM BROMIDE 100 MG/10ML IV SOLN
INTRAVENOUS | Status: DC | PRN
  Administered 2015-08-02: 50 mg via INTRAVENOUS
  Administered 2015-08-03: 20 mg via INTRAVENOUS
  Administered 2015-08-03: 50 mg via INTRAVENOUS

## 2015-08-03 MED FILL — Electrolyte-R (PH 7.4) Solution: INTRAVENOUS | Qty: 5000 | Status: AC

## 2015-08-03 MED FILL — Lidocaine HCl IV Inj 20 MG/ML: INTRAVENOUS | Qty: 5 | Status: AC

## 2015-08-03 MED FILL — Sodium Bicarbonate IV Soln 8.4%: INTRAVENOUS | Qty: 150 | Status: AC

## 2015-08-03 MED FILL — Mannitol IV Soln 20%: INTRAVENOUS | Qty: 500 | Status: AC

## 2015-08-03 MED FILL — Sodium Chloride IV Soln 0.9%: INTRAVENOUS | Qty: 3000 | Status: AC

## 2015-08-03 MED FILL — Heparin Sodium (Porcine) Inj 1000 Unit/ML: INTRAMUSCULAR | Qty: 10 | Status: AC

## 2015-08-03 NOTE — Anesthesia Preprocedure Evaluation (Addendum)
Anesthesia Evaluation  Patient identified by MRN, date of birth, ID band Patient awake    Reviewed: Allergy & Precautions, NPO status , Patient's Chart, lab work & pertinent test results  Airway Mallampati: II  TM Distance: >3 FB Neck ROM: Full    Dental  (+) Teeth Intact, Dental Advisory Given   Pulmonary neg pulmonary ROS,    Pulmonary exam normal breath sounds clear to auscultation       Cardiovascular Exercise Tolerance: Good hypertension, Pt. on medications (-) angina(-) CAD and (-) Past MI Normal cardiovascular exam Rhythm:Regular Rate:Normal  Type A Aortic dissection   Neuro/Psych CVA, No Residual Symptoms    GI/Hepatic negative GI ROS, Neg liver ROS,   Endo/Other  negative endocrine ROS  Renal/GU negative Renal ROS     Musculoskeletal negative musculoskeletal ROS (+)   Abdominal   Peds  Hematology negative hematology ROS (+)   Anesthesia Other Findings Day of surgery medications reviewed with the patient.  Reproductive/Obstetrics                            Anesthesia Physical Anesthesia Plan  ASA: IV and emergent  Anesthesia Plan: General   Post-op Pain Management:    Induction: Intravenous  Airway Management Planned: Oral ETT  Additional Equipment: Arterial line, CVP, PA Cath, TEE and Ultrasound Guidance Line Placement  Intra-op Plan:   Post-operative Plan: Post-operative intubation/ventilation  Informed Consent: I have reviewed the patients History and Physical, chart, labs and discussed the procedure including the risks, benefits and alternatives for the proposed anesthesia with the patient or authorized representative who has indicated his/her understanding and acceptance.   Dental advisory given  Plan Discussed with: CRNA  Anesthesia Plan Comments: (Risks/benefits of general anesthesia discussed with patient including risk of damage to teeth, lips, gum, and  tongue, nausea/vomiting, allergic reactions to medications, and the possibility of heart attack, stroke and death.  All patient questions answered.  Patient wishes to proceed.)        Anesthesia Quick Evaluation

## 2015-08-03 NOTE — Progress Notes (Addendum)
      [address]       [place] [id number]             [phone]      EVENING ROUNDS NOTE :     [address]       [place] [id number]             [phone]                 1 Day Post-Op Procedure(s) (LRB): THORACIC ASCENDING ANEURYSM REPAIR (AAA) (N/A)  Total Length of Stay:  LOS: 0 days  BP 78/46 mmHg  Pulse 90  Temp(Src) 100.8 F (38.2 C) (Core (Comment))  Resp 12  Ht 6' (1.829 m)  Wt 195 lb (88.451 kg)  BMI 26.44 kg/m2  SpO2 100%  .Intake/Output      10/17 0701 - 10/18 0700   I.V. (mL/kg) 4570.9 (51.7)   Blood 3631   NG/GT 60   IV Piggyback 1650   Total Intake(mL/kg) 9911.9 (112.1)   Urine (mL/kg/hr) 2285 (1.8)   Blood 2650 (2)   Chest Tube 2020 (1.6)   Total Output 6955   Net +2956.9         . sodium chloride 10 mL/hr at 08/03/15 1900  . [START ON 08/04/2015] sodium chloride    . sodium chloride 10 mL/hr at 08/03/15 1900  . dexmedetomidine 0.7 mcg/kg/hr (08/03/15 2100)  . DOPamine 5 mcg/kg/min (08/03/15 2100)  . insulin (NOVOLIN-R) infusion 0.9 Units/hr (08/03/15 2100)  . lactated ringers    . lactated ringers 10 mL/hr at 08/03/15 1900  . nitroGLYCERIN Stopped (08/03/15 0930)  . norepinephrine (LEVOPHED) Adult infusion 6 mcg/min (08/03/15 2130)  . phenylephrine (NEO-SYNEPHRINE) Adult infusion Stopped (08/03/15 0930)     Lab Results  Component Value Date   WBC 12.6* 08/03/2015   HGB 8.9* 08/03/2015   HCT 26.4* 08/03/2015   PLT 120* 08/03/2015   GLUCOSE 105* 08/03/2015   ALT 69* 08/03/2015   AST 121* 08/03/2015   NA 137 08/03/2015   K 5.2* 08/03/2015   CL 109 08/03/2015   CREATININE 1.03 08/03/2015   BUN 14 08/03/2015   CO2 23 08/03/2015   PSA 1.0 12/16/2005   INR 0.91 08/03/2015    Bleeding slowing  On levo/dop No current plan to extubate till poss am,     08/03/2015 9:43 PM  Sedated on vent, Chest tube output has decreased  Weaning levbophed  I have seen and examined Ryan Cole and  agree with the above assessment  and plan.  Delight OvensEdward B Maritza [place] MD [facility] [phone] 08/04/2015 12:07 AM

## 2015-08-03 NOTE — Progress Notes (Signed)
Just back form OR  Has significant bleeding  DIC panel from OR showed elevated PT and PTT- will give 4 units FFP  PLT down to 96 K- transfuse 1 pack of platelets  Will give 25 mg protamine empirically  Recheck labs after blood products  Adriella Essex C. Dorris FetchHendrickson, MD [facility] [phone]

## 2015-08-03 NOTE — Progress Notes (Signed)
Preliminary Stat Echo Read  Ryan Cole presented with chest pain and CTA to evaluate for PE demonstrated dilated Aortic Root to 5.1cm concerning for dissection but not visualizing Aortic Root and valve well. Preserved LV and RV function with estimated LV EF ~ 55%. No pericardial effusion noted. Aortic dissection with dissection flap noted. Largest ascending aorta dimensions noted to be 5.5cm. Severe, eccentric aortic insufficiency also noted with mild MR. Dr. Dorris FetchHendrickson present for echo and results communicated with him. Full report to follow in AM.   [name], MD

## 2015-08-03 NOTE — Brief Op Note (Addendum)
08/02/2015  7:54 AM  PATIENT:  Ryan Cole  59 y.o. male  PRE-OPERATIVE DIAGNOSIS:  Type I Aortic Dissection  POST-OPERATIVE DIAGNOSIS:  Type 1 Aortic Dissection  PROCEDURE:   EMERGENCY THORACIC ASCENDING ANEURYSM REPAIR   Deep Hypothermic circulatory arrest  Total aortic arch and aortic root replacement  Resuspension of the native aortic valve  Reimplantation of left and right coronary arteries   SURGEON:  Loreli SlotSteven C Thurston Brendlinger, MD  ASSISTANT: Coral CeoGina Collins, PA-C  ANESTHESIA:   general  PATIENT CONDITION:  ICU - intubated and hemodynamically stable.  PRE-OPERATIVE WEIGHT: 88 kg  Acute Type I dissection from coronaries through aortic arch. Inflammatory area in ascending adjacent to the PA Dissection extended into innominate and left common carotid. Severe AI Coagulopathy Post bypass TEE showed mild to moderate AI, preserved LV fuction

## 2015-08-03 NOTE — Anesthesia Procedure Notes (Addendum)
Procedure Name: Intubation Date/Time: 08/02/2015 11:57 PM Performed by: Arlice ColtMANESS, CARRIE B Pre-anesthesia Checklist: Patient identified, Emergency Drugs available, Suction available, Patient being monitored and Timeout performed Patient Re-evaluated:Patient Re-evaluated prior to inductionOxygen Delivery Method: Circle system utilized Preoxygenation: Pre-oxygenation with 100% oxygen Intubation Type: IV induction Ventilation: Mask ventilation without difficulty Laryngoscope Size: Mac and 4 Grade View: Grade II Tube type: Subglottic suction tube Tube size: 8.0 mm Number of attempts: 2 Airway Equipment and Method: Stylet Placement Confirmation: ETT inserted through vocal cords under direct vision,  positive ETCO2 and breath sounds checked- equal and bilateral Secured at: 25 cm Tube secured with: Tape Dental Injury: Teeth and Oropharynx as per pre-operative assessment

## 2015-08-03 NOTE — Care Management Note (Signed)
Case Management Note  Patient Details  Name: Alvester MorinDaryl V Poplar MRN: [id number] Date of Birth: 06/16/1956  Subjective/Objective:                    Action/Plan:   Expected Discharge Date:                  Expected Discharge Plan:  Home/Self Care  In-House Referral:     Discharge planning Services  CM Consult  Post Acute Care Choice:    Choice offered to:     DME Arranged:    DME Agency:     HH Arranged:    HH Agency:     Status of Service:  In process, will continue to follow  Medicare Important Message Given:    Date Medicare IM Given:    Medicare IM give by:    Date Additional Medicare IM Given:    Additional Medicare Important Message give by:     If discussed at Long Length of Stay Meetings, dates discussed:    Additional Comments:  Vangie BickerBrown, Naliya Gish Jane, RN 08/03/2015, 10:50 AM

## 2015-08-03 NOTE — Transfer of Care (Signed)
Immediate Anesthesia Transfer of Care Note  Patient: Ryan Cole  Procedure(s) Performed: Procedure(s): THORACIC ASCENDING ANEURYSM REPAIR (AAA) (N/A)  Patient Location: SICU  Anesthesia Type:General  Level of Consciousness: sedated and unresponsive  Airway & Oxygen Therapy: Patient remains intubated per anesthesia plan and Patient placed on Ventilator (see vital sign flow sheet for setting)  Post-op Assessment: Report given to RN and Post -op Vital signs reviewed and stable  Post vital signs: Reviewed and stable  Last Vitals:  Filed Vitals:   08/03/15 0940  BP: 91/53  Pulse: 87  Temp:   Resp: 15    Complications: No apparent anesthesia complications

## 2015-08-03 NOTE — Progress Notes (Signed)
Echocardiogram Echocardiogram Transesophageal has been performed.  Nolon RodBrown, Tony 08/03/2015, 12:43 AM

## 2015-08-03 NOTE — Anesthesia Postprocedure Evaluation (Signed)
  Anesthesia Post-op Note  Patient: Ryan Cole  Procedure(s) Performed: Procedure(s): THORACIC ASCENDING ANEURYSM REPAIR (AAA) (N/A)  Patient Location: ICU  Anesthesia Type:General  Level of Consciousness: sedated  Airway and Oxygen Therapy: Patient remains intubated per anesthesia plan  Post-op Pain: none  Post-op Assessment: Post-op Vital signs reviewed, Patient's Cardiovascular Status Stable and Respiratory Function Stable              Post-op Vital Signs: Reviewed and stable  Last Vitals:  Filed Vitals:   08/03/15 1400  BP: 92/55  Pulse: 92  Temp: 37.7 C  Resp: 12    Complications: No apparent anesthesia complications

## 2015-08-04 ENCOUNTER — Inpatient Hospital Stay (HOSPITAL_COMMUNITY): Payer: Non-veteran care

## 2015-08-04 DIAGNOSIS — I214 Non-ST elevation (NSTEMI) myocardial infarction: Secondary | ICD-10-CM | POA: Diagnosis not present

## 2015-08-04 DIAGNOSIS — I5021 Acute systolic (congestive) heart failure: Secondary | ICD-10-CM | POA: Diagnosis not present

## 2015-08-04 DIAGNOSIS — I7101 Dissection of thoracic aorta: Secondary | ICD-10-CM | POA: Diagnosis not present

## 2015-08-04 DIAGNOSIS — D65 Disseminated intravascular coagulation [defibrination syndrome]: Secondary | ICD-10-CM | POA: Diagnosis not present

## 2015-08-04 LAB — GLUCOSE, CAPILLARY
GLUCOSE-CAPILLARY: 102 mg/dL — AB (ref 65–99)
GLUCOSE-CAPILLARY: 102 mg/dL — AB (ref 65–99)
GLUCOSE-CAPILLARY: 108 mg/dL — AB (ref 65–99)
GLUCOSE-CAPILLARY: 110 mg/dL — AB (ref 65–99)
GLUCOSE-CAPILLARY: 133 mg/dL — AB (ref 65–99)
GLUCOSE-CAPILLARY: 137 mg/dL — AB (ref 65–99)
GLUCOSE-CAPILLARY: 92 mg/dL (ref 65–99)
GLUCOSE-CAPILLARY: 95 mg/dL (ref 65–99)
Glucose-Capillary: 74 mg/dL (ref 65–99)
Glucose-Capillary: 132 mg/dL — ABNORMAL HIGH (ref 65–99)

## 2015-08-04 LAB — PREPARE FRESH FROZEN PLASMA
UNIT DIVISION: 0
UNIT DIVISION: 0
UNIT DIVISION: 0
UNIT DIVISION: 0
Unit division: 0
Unit division: 0
Unit division: 0
Unit division: 0
Unit division: 0
Unit division: 0

## 2015-08-04 LAB — BLOOD GAS, ARTERIAL
ACID-BASE DEFICIT: 0.8 mmol/L (ref 0.0–2.0)
Bicarbonate: 22.6 mEq/L (ref 20.0–24.0)
FIO2: 0.5
MECHVT: 700 mL
O2 SAT: 99.2 %
PEEP: 5 cmH2O
PH ART: 7.457 — AB (ref 7.350–7.450)
PRESSURE SUPPORT: 10 cmH2O
Patient temperature: 98.6
RATE: 12 resp/min
TCO2: 23.6 mmol/L (ref 0–100)
pCO2 arterial: 32.5 mmHg — ABNORMAL LOW (ref 35.0–45.0)
pO2, Arterial: 147 mmHg — ABNORMAL HIGH (ref 80.0–100.0)

## 2015-08-04 LAB — CBC
HCT: 26 % — ABNORMAL LOW (ref 39.0–52.0)
HEMATOCRIT: 23.1 % — AB (ref 39.0–52.0)
Hemoglobin: 7.9 g/dL — ABNORMAL LOW (ref 13.0–17.0)
Hemoglobin: 8.6 g/dL — ABNORMAL LOW (ref 13.0–17.0)
MCH: 30 pg (ref 26.0–34.0)
MCH: 29.3 pg (ref 26.0–34.0)
MCHC: 34.2 g/dL (ref 30.0–36.0)
MCHC: 33.1 g/dL (ref 30.0–36.0)
MCV: 87.8 fL (ref 78.0–100.0)
MCV: 88.4 fL (ref 78.0–100.0)
PLATELETS: 103 10*3/uL — AB (ref 150–400)
PLATELETS: 76 10*3/uL — AB (ref 150–400)
RBC: 2.63 MIL/uL — ABNORMAL LOW (ref 4.22–5.81)
RBC: 2.94 MIL/uL — AB (ref 4.22–5.81)
RDW: 16.8 % — AB (ref 11.5–15.5)
RDW: 17.9 % — ABNORMAL HIGH (ref 11.5–15.5)
WBC: 14.4 10*3/uL — AB (ref 4.0–10.5)
WBC: 17 10*3/uL — ABNORMAL HIGH (ref 4.0–10.5)

## 2015-08-04 LAB — PREPARE PLATELET PHERESIS
Unit division: 0
Unit division: 0

## 2015-08-04 LAB — POCT I-STAT 3, ART BLOOD GAS (G3+)
ACID-BASE DEFICIT: 1 mmol/L (ref 0.0–2.0)
ACID-BASE EXCESS: 1 mmol/L (ref 0.0–2.0)
BICARBONATE: 25 meq/L — AB (ref 20.0–24.0)
BICARBONATE: 24.5 meq/L — AB (ref 20.0–24.0)
O2 Saturation: 97 %
O2 Saturation: 99 %
PCO2 ART: 39.1 mmHg (ref 35.0–45.0)
PCO2 ART: 43.3 mmHg (ref 35.0–45.0)
PH ART: 7.417 (ref 7.350–7.450)
PH ART: 7.364 (ref 7.350–7.450)
PO2 ART: 96 mmHg (ref 80.0–100.0)
PO2 ART: 137 mmHg — AB (ref 80.0–100.0)
Patient temperature: 37.7
Patient temperature: 37.7
TCO2: 26 mmol/L (ref 0–100)
TCO2: 26 mmol/L (ref 0–100)

## 2015-08-04 LAB — POCT I-STAT, CHEM 8
BUN: 21 mg/dL — ABNORMAL HIGH (ref 6–20)
CALCIUM ION: 1.19 mmol/L (ref 1.12–1.23)
CHLORIDE: 102 mmol/L (ref 101–111)
Creatinine, Ser: 0.9 mg/dL (ref 0.61–1.24)
Glucose, Bld: 149 mg/dL — ABNORMAL HIGH (ref 65–99)
HCT: 25 % — ABNORMAL LOW (ref 39.0–52.0)
HEMOGLOBIN: 8.5 g/dL — AB (ref 13.0–17.0)
POTASSIUM: 4.8 mmol/L (ref 3.5–5.1)
SODIUM: 138 mmol/L (ref 135–145)
TCO2: 23 mmol/L (ref 0–100)

## 2015-08-04 LAB — BASIC METABOLIC PANEL
ANION GAP: 7 (ref 5–15)
BUN: 15 mg/dL (ref 6–20)
CALCIUM: 8 mg/dL — AB (ref 8.9–10.3)
CO2: 25 mmol/L (ref 22–32)
CREATININE: 1.03 mg/dL (ref 0.61–1.24)
Chloride: 109 mmol/L (ref 101–111)
GFR calc non Af Amer: >60 mL/min (ref >60)
Glucose, Bld: 122 mg/dL — ABNORMAL HIGH (ref 65–99)
Potassium: 5 mmol/L (ref 3.5–5.1)
SODIUM: 141 mmol/L (ref 135–145)

## 2015-08-04 LAB — CREATININE, SERUM
Creatinine, Ser: 0.95 mg/dL (ref 0.61–1.24)
GFR calc Af Amer: >60 mL/min (ref >60)
GFR calc non Af Amer: >60 mL/min (ref >60)

## 2015-08-04 LAB — MAGNESIUM
Magnesium: 2.2 mg/dL (ref 1.7–2.4)
Magnesium: 2.3 mg/dL (ref 1.7–2.4)

## 2015-08-04 LAB — PREPARE RBC (CROSSMATCH)

## 2015-08-04 MED ORDER — INSULIN ASPART 100 UNIT/ML ~~LOC~~ SOLN
0.0000 [IU] | SUBCUTANEOUS | Status: DC
  Administered 2015-08-04: 2 [IU] via SUBCUTANEOUS

## 2015-08-04 MED ORDER — FUROSEMIDE 10 MG/ML IJ SOLN
20.0000 mg | Freq: Once | INTRAMUSCULAR | Status: AC
  Administered 2015-08-04: 20 mg via INTRAVENOUS
  Filled 2015-08-04: qty 2

## 2015-08-04 MED ORDER — SODIUM CHLORIDE 0.9 % IV SOLN
Freq: Once | INTRAVENOUS | Status: AC
  Administered 2015-08-04: 10 mL/h via INTRAVENOUS

## 2015-08-04 MED ORDER — ALBUMIN HUMAN 5 % IV SOLN
12.5000 g | Freq: Once | INTRAVENOUS | Status: AC
  Administered 2015-08-04: 12.5 g via INTRAVENOUS

## 2015-08-04 NOTE — Procedures (Signed)
Extubation Procedure Note  Patient Details:   Name: Ryan Cole DOB: 05/26/1956 MRN: [id number]   Airway Documentation:  Airway 8 mm (Active)  Secured at (cm) 23 cm 08/04/2015  7:33 AM  Measured From Lips 08/04/2015  7:33 AM  Secured Location Center 08/04/2015  7:33 AM  Secured By [employer] Tube Holder 08/04/2015  7:33 AM  Tube Holder Repositioned Yes 08/04/2015  7:33 AM  Cuff Pressure (cm H2O) 22 cm H2O 08/04/2015  3:40 AM  Site Condition Dry 08/04/2015  7:33 AM    Evaluation  O2 sats: stable throughout Complications: No apparent complications Patient did tolerate procedure well. Bilateral Breath Sounds: Clear Suctioning: Airway Yes Pt extubated to nasal cannula @ 4l.  Cuff leak verified.  RN at bedside.   [name] 08/04/2015, 9:47 AM

## 2015-08-04 NOTE — Progress Notes (Signed)
[facility] BRIEF SICU PROGRESS NOTE  2 Days Post-Op  S/P Procedure(s) (LRB): THORACIC ASCENDING ANEURYSM REPAIR (AAA) (N/A)   Stable day Extubated uneventfully NSR w/ stable BP off drips UOP adequate  Plan: Continue current plan  Purcell Nailslarence H Leesa Leifheit, MD 08/04/2015 5:25 PM

## 2015-08-04 NOTE — Progress Notes (Signed)
2 Days Post-Op Procedure(s) (LRB): THORACIC ASCENDING ANEURYSM REPAIR (AAA) (N/A) Subjective: Intubated, but alert Responds appropriately, moves all 4 ext  Objective: Vital signs in last 24 hours: Temp:  [95.5 F (35.3 C)-101.7 F (38.7 C)] 100 F (37.8 C) (10/18 0630) Pulse Rate:  [81-103] 93 (10/18 0732) Cardiac Rhythm:  [-] Atrial paced (10/18 0600) Resp:  [0-20] 13 (10/18 0732) BP: (73-163)/(43-72) 79/49 mmHg (10/18 0732) SpO2:  [99 %-100 %] 100 % (10/18 0732) Arterial Line BP: (67-192)/(37-74) 116/43 mmHg (10/18 0630) FiO2 (%):  [40 %-50 %] 40 % (10/18 0733) Weight:  [210 lb 12.2 oz (95.6 kg)] 210 lb 12.2 oz (95.6 kg) (10/18 0545)  Hemodynamic parameters for last 24 hours: PAP: (19-37)/(12-24) 28/17 mmHg CO:  [3 L/min-5.2 L/min] 4.6 L/min CI:  [1.5 L/min/m2-2.5 L/min/m2] 2.2 L/min/m2  Intake/Output from previous day: 10/17 0701 - 10/18 0700 In: 11389.3 [I.V.:5698.3; Blood:3631; NG/GT:110; IV Piggyback:1950] Out: 7890 [Urine:2890; Emesis/NG output:100; Blood:2650; Chest Tube:2250] Intake/Output this shift:    General appearance: alert, cooperative and no distress Neurologic: intact Heart: regular rate and rhythm Lungs: clear to auscultation bilaterally Abdomen: normal findings: soft, non-tender  Lab Results:  Recent Labs  08/03/15 1950 08/04/15 0301  WBC 12.6* 14.4*  HGB 8.9* 7.9*  HCT 26.4* 23.1*  PLT 120* 103*   BMET:  Recent Labs  08/03/15 1950 08/04/15 0301  NA 137 141  K 5.2* 5.0  CL 109 109  CO2 23 25  GLUCOSE 105* 122*  BUN 14 15  CREATININE 1.03 1.03  CALCIUM 7.8* 8.0*    PT/INR:  Recent Labs  08/03/15 1115  LABPROT 12.5  INR 0.91   ABG    Component Value Date/Time   PHART 7.457* 08/04/2015 0430   HCO3 22.6 08/04/2015 0430   TCO2 23.6 08/04/2015 0430   ACIDBASEDEF 0.8 08/04/2015 0430   O2SAT 99.2 08/04/2015 0430   CBG (last 3)   Recent Labs  08/03/15 2309 08/04/15 0012 08/04/15 0409  GLUCAP 110* 95 108*     Assessment/Plan: S/P Procedure(s) (LRB): THORACIC ASCENDING ANEURYSM REPAIR (AAA) (N/A) POD # 1  CV- s/p repair type I dissection, hemodynamics look good- wean norepi and dopamine as BP allows  Dc swan  BP control not an issue so far  RESP- wean vent  RENAL creatinine and lytes OK- volume overloaded, diurese as BP allows  ENDO- CBG OK- change to q4  Stress dose steroids  Anemia secondary to ABL- improved, Hgb 7.9 but still on pressors and likely to drift further down- will transfuse one unit  Coagulopathy- resolved  Thrombocytopenia- follow  SCD for DVT prophylaxis, no enoxaparin at this point due to bleeding risk   LOS: 1 day    Loreli SlotSteven C Tonja Jezewski 08/04/2015

## 2015-08-04 NOTE — Progress Notes (Signed)
Inpatient Diabetes Program Recommendations  AACE/ADA: New Consensus Statement on Inpatient Glycemic Control (2015)  Target Ranges:  Prepandial:   less than 140 mg/dL      Peak postprandial:   less than 180 mg/dL (1-2 hours)      Critically ill patients:  140 - 180 mg/dL   Review of Glycemic Control  Diabetes history: none Outpatient Diabetes medications: none Current orders for Inpatient glycemic control: Novolog 0-24 units q4h  Inpatient Diabetes Program Recommendations: Review of Glycemic Control   Diabetes history: None Current orders for Inpatient glycemic control: Insulin Gtt for Cardiac Surgery, transition insulin orders; Levemir 20 units qday, Novolog 0-24 units q4h  Inpatient Diabetes Program Recommendation;  Please consider ordering an A1C to determine blood sugar control over the past 2-3 months.   Susette RacerJulie Mekesha Solomon, RN, BA, MHA, CDE Diabetes Coordinator Inpatient Diabetes Program  [phone] (Team Pager) [phone] [facility] Office) 08/04/2015 8:21 AM

## 2015-08-04 NOTE — Care Management Note (Signed)
Case Management Note  Patient Details  Name: Alvester MorinDaryl V Mistry MRN: [id number] Date of Birth: 08/19/1956  Subjective/Objective:     Patient extubated and sitting up in chair.  Has difficulty finding the right words.  Was able to communicate to me that he lives at home with his wife, independent prior.  He said his wife does work, plan is to return home.  Unable to determine if she would be able to be with him 24/7.                Action/Plan:   Expected Discharge Date:  08/10/15               Expected Discharge Plan:  Home/Self Care  In-House Referral:     Discharge planning Services  CM Consult  Post Acute Care Choice:    Choice offered to:     DME Arranged:    DME Agency:     HH Arranged:    HH Agency:     Status of Service:  In process, will continue to follow  Medicare Important Message Given:    Date Medicare IM Given:    Medicare IM give by:    Date Additional Medicare IM Given:    Additional Medicare Important Message give by:     If discussed at Long Length of Stay Meetings, dates discussed:    Additional Comments:  Vangie BickerBrown, Nicolus Ose Jane, RN 08/04/2015, 4:03 PM

## 2015-08-05 ENCOUNTER — Inpatient Hospital Stay (HOSPITAL_COMMUNITY): Payer: Non-veteran care

## 2015-08-05 LAB — POCT I-STAT 4, (NA,K, GLUC, HGB,HCT)
GLUCOSE: 120 mg/dL — AB (ref 65–99)
HEMATOCRIT: 22 % — AB (ref 39.0–52.0)
Hemoglobin: 7.5 g/dL — ABNORMAL LOW (ref 13.0–17.0)
Potassium: 4 mmol/L (ref 3.5–5.1)
SODIUM: 136 mmol/L (ref 135–145)

## 2015-08-05 LAB — BASIC METABOLIC PANEL
ANION GAP: 9 (ref 5–15)
BUN: 24 mg/dL — ABNORMAL HIGH (ref 6–20)
CALCIUM: 8.6 mg/dL — AB (ref 8.9–10.3)
CO2: 26 mmol/L (ref 22–32)
CREATININE: 1 mg/dL (ref 0.61–1.24)
Chloride: 102 mmol/L (ref 101–111)
Glucose, Bld: 128 mg/dL — ABNORMAL HIGH (ref 65–99)
Potassium: 4.6 mmol/L (ref 3.5–5.1)
Sodium: 137 mmol/L (ref 135–145)

## 2015-08-05 LAB — TYPE AND SCREEN
ABO/RH(D): O POS
ANTIBODY SCREEN: POSITIVE
DAT, IgG: POSITIVE
UNIT DIVISION: 0
UNIT DIVISION: 0
UNIT DIVISION: 0
UNIT DIVISION: 0
Unit division: 0
Unit division: 0
Unit division: 0
Unit division: 0

## 2015-08-05 LAB — GLUCOSE, CAPILLARY
GLUCOSE-CAPILLARY: 89 mg/dL (ref 65–99)
GLUCOSE-CAPILLARY: 94 mg/dL (ref 65–99)
Glucose-Capillary: 89 mg/dL (ref 65–99)
Glucose-Capillary: 102 mg/dL — ABNORMAL HIGH (ref 65–99)
Glucose-Capillary: 100 mg/dL — ABNORMAL HIGH (ref 65–99)
Glucose-Capillary: 97 mg/dL (ref 65–99)

## 2015-08-05 LAB — CBC
HCT: 24.5 % — ABNORMAL LOW (ref 39.0–52.0)
Hemoglobin: 8 g/dL — ABNORMAL LOW (ref 13.0–17.0)
MCH: 29.2 pg (ref 26.0–34.0)
MCHC: 32.7 g/dL (ref 30.0–36.0)
MCV: 89.4 fL (ref 78.0–100.0)
PLATELETS: 71 10*3/uL — AB (ref 150–400)
RBC: 2.74 MIL/uL — AB (ref 4.22–5.81)
RDW: 18.2 % — ABNORMAL HIGH (ref 11.5–15.5)
WBC: 17.7 10*3/uL — ABNORMAL HIGH (ref 4.0–10.5)

## 2015-08-05 MED ORDER — FUROSEMIDE 10 MG/ML IJ SOLN
20.0000 mg | Freq: Two times a day (BID) | INTRAMUSCULAR | Status: DC
  Administered 2015-08-05 – 2015-08-07 (×6): 20 mg via INTRAVENOUS
  Filled 2015-08-05 (×10): qty 2

## 2015-08-05 MED ORDER — INSULIN ASPART 100 UNIT/ML ~~LOC~~ SOLN: 0.0000 [IU] | Freq: Three times a day (TID) | SUBCUTANEOUS | Status: DC

## 2015-08-05 MED FILL — Heparin Sodium (Porcine) Inj 1000 Unit/ML: INTRAMUSCULAR | Qty: 30 | Status: AC

## 2015-08-05 MED FILL — Magnesium Sulfate Inj 50%: INTRAMUSCULAR | Qty: 10 | Status: AC

## 2015-08-05 MED FILL — Potassium Chloride Inj 2 mEq/ML: INTRAVENOUS | Qty: 40 | Status: AC

## 2015-08-05 NOTE — Progress Notes (Signed)
3 Days Post-Op Procedure(s) (LRB): THORACIC ASCENDING ANEURYSM REPAIR (AAA) (N/A) Subjective: No complaints Does have some psychomotor slowing  Objective: Vital signs in last 24 hours: Temp:  [97.9 F (36.6 C)-99.9 F (37.7 C)] 98.5 F (36.9 C) (10/19 0720) Pulse Rate:  [83-117] 93 (10/19 0700) Cardiac Rhythm:  [-] Normal sinus rhythm (10/19 0600) Resp:  [0-27] 0 (10/19 0700) BP: (76-134)/(44-69) 94/56 mmHg (10/19 0700) SpO2:  [92 %-100 %] 98 % (10/19 0700) Arterial Line BP: (88-184)/(38-72) 175/67 mmHg (10/19 0500) Weight:  [210 lb 5.1 oz (95.4 kg)] 210 lb 5.1 oz (95.4 kg) (10/19 0600)  Hemodynamic parameters for last 24 hours:    Intake/Output from previous day: 10/18 0701 - 10/19 0700 In: 989.6 [P.O.:240; I.V.:619.6; NG/GT:30; IV Piggyback:100] Out: 2105 [Urine:1635; Emesis/NG output:150; Chest Tube:320] Intake/Output this shift:    General appearance: alert, cooperative and no distress Neurologic: no focal deficit, +psychomotor slowing Heart: regular rate and rhythm Lungs: diminished breath sounds bibasilar Abdomen: normal findings: soft, non-tender Extremities: well perfused, pulses intact  Lab Results:  Recent Labs  08/04/15 1644 08/05/15 0427  WBC 17.0* 17.7*  HGB 8.6* 8.0*  HCT 26.0* 24.5*  PLT 76* 71*   BMET:  Recent Labs  08/04/15 0301 08/04/15 1641 08/04/15 1644 08/05/15 0427  NA 141 138  --  137  K 5.0 4.8  --  4.6  CL 109 102  --  102  CO2 25  --   --  26  GLUCOSE 122* 149*  --  128*  BUN 15 21*  --  24*  CREATININE 1.03 0.90 0.95 1.00  CALCIUM 8.0*  --   --  8.6*    PT/INR:  Recent Labs  08/03/15 1115  LABPROT 12.5  INR 0.91   ABG    Component Value Date/Time   PHART 7.364 08/04/2015 0958   HCO3 24.5* 08/04/2015 0958   TCO2 23 08/04/2015 1641   ACIDBASEDEF 1.0 08/04/2015 0958   O2SAT 99.0 08/04/2015 0958   CBG (last 3)   Recent Labs  08/05/15 0002 08/05/15 0335 08/05/15 0718  GLUCAP 89 89 94     Assessment/Plan: S/P Procedure(s) (LRB): THORACIC ASCENDING ANEURYSM REPAIR (AAA) (N/A) -  CV- stable, BP remains on low side  RESP- good oxygenation. CXR shows some bibasilar atelectasis and likely a right pleural effusion  RENAL_ BUN up slightly but creatinine and lytes OK- diurese as BP allows  ENDO- CBG well controlled- change to AC/HS  Stress dose hydrocortisone for chronic steroid use  GI- history of ulcerative colitis  Tolerating clears- advance diet  SCD for DVT prophylaxis- no lovenox due to thrombocytopenia/ bleeding risk  Anemia- secondary to ABL- stable, follow  Thrombocytopenia- down slightly, no heparin for now  Requiring assistance to get OOB, PT consult   LOS: 2 days    Ryan Cole 08/05/2015

## 2015-08-05 NOTE — Progress Notes (Signed)
      [address]       [place] [id number]             [phone]       Ambulated around entire unit today  BP 120/66 mmHg  Pulse 97  Temp(Src) 98.1 F (36.7 C) (Oral)  Resp 13  Ht 6' (1.829 m)  Wt 210 lb 5.1 oz (95.4 kg)  BMI 28.52 kg/m2  SpO2 98%   Intake/Output Summary (Last 24 hours) at 08/05/15 1807 Last data filed at 08/05/15 1800  Gross per 24 hour  Intake    830 ml  Output   1265 ml  Net   -435 ml    Doing well, does have some psychomotor slowing, but hopefully that improves with time  Viviann SpareSteven C. Dorris FetchHendrickson, MD [facility] [phone]

## 2015-08-06 ENCOUNTER — Inpatient Hospital Stay (HOSPITAL_COMMUNITY): Payer: Non-veteran care

## 2015-08-06 LAB — BASIC METABOLIC PANEL
Anion gap: 5 (ref 5–15)
BUN: 30 mg/dL — AB (ref 6–20)
CHLORIDE: 102 mmol/L (ref 101–111)
CO2: 29 mmol/L (ref 22–32)
CREATININE: 0.91 mg/dL (ref 0.61–1.24)
Calcium: 8.2 mg/dL — ABNORMAL LOW (ref 8.9–10.3)
GFR calc Af Amer: >60 mL/min (ref >60)
GFR calc non Af Amer: >60 mL/min (ref >60)
GLUCOSE: 103 mg/dL — AB (ref 65–99)
POTASSIUM: 4.2 mmol/L (ref 3.5–5.1)
Sodium: 136 mmol/L (ref 135–145)

## 2015-08-06 LAB — TYPE AND SCREEN
ABO/RH(D): O POS
ANTIBODY SCREEN: POSITIVE
UNIT DIVISION: 0
Unit division: 0
Unit division: 0
Unit division: 0

## 2015-08-06 LAB — CBC
HEMATOCRIT: 21.5 % — AB (ref 39.0–52.0)
Hemoglobin: 7 g/dL — ABNORMAL LOW (ref 13.0–17.0)
MCH: 29.4 pg (ref 26.0–34.0)
MCHC: 32.6 g/dL (ref 30.0–36.0)
MCV: 90.3 fL (ref 78.0–100.0)
PLATELETS: 74 10*3/uL — AB (ref 150–400)
RBC: 2.38 MIL/uL — ABNORMAL LOW (ref 4.22–5.81)
RDW: 17.6 % — ABNORMAL HIGH (ref 11.5–15.5)
WBC: 14.6 10*3/uL — AB (ref 4.0–10.5)

## 2015-08-06 LAB — PREPARE RBC (CROSSMATCH)

## 2015-08-06 MED ORDER — METOCLOPRAMIDE HCL 10 MG PO TABS
10.0000 mg | ORAL_TABLET | Freq: Three times a day (TID) | ORAL | Status: DC
  Administered 2015-08-06 – 2015-08-08 (×7): 10 mg via ORAL
  Filled 2015-08-06 (×9): qty 1

## 2015-08-06 MED ORDER — PREDNISONE 20 MG PO TABS
30.0000 mg | ORAL_TABLET | Freq: Every day | ORAL | Status: DC
  Administered 2015-08-06 – 2015-08-10 (×5): 30 mg via ORAL
  Filled 2015-08-06 (×9): qty 1

## 2015-08-06 MED ORDER — SODIUM CHLORIDE 0.9 % IV SOLN
Freq: Once | INTRAVENOUS | Status: AC
  Administered 2015-08-06: 13:00:00 via INTRAVENOUS

## 2015-08-06 MED ORDER — AMIODARONE HCL 200 MG PO TABS
400.0000 mg | ORAL_TABLET | Freq: Two times a day (BID) | ORAL | Status: DC
  Administered 2015-08-06 – 2015-08-07 (×4): 400 mg via ORAL
  Filled 2015-08-06 (×6): qty 2

## 2015-08-06 MED ORDER — MESALAMINE ER 0.375 G PO CP24
1.5000 g | ORAL_CAPSULE | Freq: Every day | ORAL | Status: DC
  Administered 2015-08-06 – 2015-08-16 (×11): 1.5 g via ORAL
  Filled 2015-08-06 (×6): qty 4

## 2015-08-06 NOTE — Progress Notes (Signed)
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  Pt to OR for emergent TAA repair. Bleeding complications post op.   Extubated 10/18.    Clinical Impression  Pt admitted with/for emergent repair of TAA.  Pt currently limited functionally due to the problems listed. ( See problems list.)   Pt will benefit from PT to maximize function and safety in order to get ready for next venue listed below.     Follow Up Recommendations CIR    Equipment Recommendations  None recommended by PT;Other (comment) (TBA)    Recommendations for Other Services Rehab consult     Precautions / Restrictions Precautions Precautions: Fall;Sternal      Mobility  Bed Mobility Overal bed mobility: Needs Assistance Bed Mobility: Supine to Sit     Supine to sit: Min assist     General bed mobility comments: stability assist and guiding cues  Transfers Overall transfer level: Needs assistance   Transfers: Sit to/from Stand Sit to Stand: Min assist         General transfer comment: stability assist  Ambulation/Gait Ambulation/Gait assistance: Min assist Ambulation Distance (Feet): 300 Feet Assistive device: 1 person hand held assist Gait Pattern/deviations: Step-through pattern Gait velocity: slower Gait velocity interpretation: Below normal speed for age/gender General Gait Details: mildly unsteady and guarded gait.  Stairs            Wheelchair Mobility    Modified Rankin (Stroke Patients Only)       Balance Overall balance assessment: Needs assistance Sitting-balance support: No upper extremity supported;Single extremity supported Sitting balance-Leahy Scale: Fair Sitting balance - Comments: didn't handle challenge without using UE's   Standing balance support: No upper extremity  supported;Single extremity supported Standing balance-Leahy Scale: Fair                               Pertinent Vitals/Pain Pain Assessment: Faces Faces Pain Scale: Hurts even more Pain Location: chest incision. Pain Descriptors / Indicators: Aching;Discomfort;Grimacing Pain Intervention(s): Monitored during session    Home Living Family/patient expects to be discharged to:: Private residence Living Arrangements: Spouse/significant other Available Help at Discharge: Family;Other (Comment);Available 24 hours/day (wife, sister and another will provide 24 hour assist as need) Type of Home: House           Additional Comments: no family to determine PLOF and home env.  pt with expressive difficulties.    Prior Function Level of Independence: Independent               Hand Dominance        Extremity/Trunk Assessment   Upper Extremity Assessment: Defer to OT evaluation           Lower Extremity Assessment: Generalized weakness         Communication   Communication: Expressive difficulties  Cognition Arousal/Alertness: Lethargic Behavior During Therapy: Flat affect Overall Cognitive Status: Impaired/Different from baseline Area of Impairment: Attention;Following commands;Safety/judgement;Awareness;Problem solving   Current Attention Level: Sustained   Following Commands: Follows one step commands with increased time;Follows one step commands inconsistently Safety/Judgement: Decreased awareness of safety;Decreased awareness of deficits Awareness: Intellectual Problem Solving: Slow processing;Requires tactile cues      General Comments General comments (skin integrity, edema, etc.): EHR  up to 119 bpm NSR, but  generally in the 100's ;  sats >95% RA    Exercises        Assessment/Plan    PT Assessment Patient needs continued PT services  PT Diagnosis Generalized weakness;Altered mental status;Abnormality of gait   PT Problem List  Decreased strength;Decreased activity tolerance;Decreased balance;Decreased mobility;Pain;Decreased cognition;Decreased safety awareness  PT Treatment Interventions Gait training;Stair training;Functional mobility training;Therapeutic activities;Balance training;Neuromuscular re-education;Patient/family education;DME instruction   PT Goals (Current goals can be found in the Care Plan section) Acute Rehab PT Goals Patient Stated Goal: pt unable to state.  Wife wants him home as soon as approp. PT Goal Formulation: Patient unable to participate in goal setting Time For Goal Achievement: 08/20/15 Potential to Achieve Goals: Good    Frequency Min 3X/week   Barriers to discharge        Co-evaluation               End of Session   Activity Tolerance: Patient tolerated treatment well Patient left: in chair;with call bell/phone within reach Nurse Communication: Mobility status         Time: [id number] PT Time Calculation (min) (ACUTE ONLY): 24 min   Charges:   PT Evaluation $Initial PT Evaluation Tier I: 1 Procedure PT Treatments $Gait Training: 8-22 mins   PT G Codes:        Nyeli Holtmeyer, Eliseo GumKenneth V 08/06/2015, 4:51 PM 08/06/2015  Cliffdell BingKen Taya Ashbaugh, PT [phone]  (pager)

## 2015-08-06 NOTE — Progress Notes (Signed)
4 Days Post-Op Procedure(s) (LRB): THORACIC ASCENDING ANEURYSM REPAIR (AAA) (N/A) Subjective: No complaints Had transient rapid a fib while ambulating this AM   Objective: Vital signs in last 24 hours: Temp:  [97.6 F (36.4 C)-99 F (37.2 C)] 97.6 F (36.4 C) (10/20 0339) Pulse Rate:  [90-101] 97 (10/20 0700) Cardiac Rhythm:  [-] Normal sinus rhythm (10/20 0400) Resp:  [0-22] 15 (10/20 0700) BP: (85-127)/(51-66) 91/51 mmHg (10/20 0700) SpO2:  [93 %-100 %] 98 % (10/20 0700) Weight:  [208 lb 1.8 oz (94.4 kg)] 208 lb 1.8 oz (94.4 kg) (10/20 0500)  Hemodynamic parameters for last 24 hours:    Intake/Output from previous day: 10/19 0701 - 10/20 0700 In: 1103 [P.O.:870; I.V.:233] Out: 1030 [Urine:990; Chest Tube:40] Intake/Output this shift:    General appearance: alert, cooperative and no distress Neurologic: nonfocal, but still has flat affect/ psychomotor slowing Heart: regular rate and rhythm Lungs: diminished breath sounds bibasilar Abdomen: mildly distended, nontender Extremities: well perfused, no edema  Lab Results:  Recent Labs  08/05/15 0427 08/05/15 1726 08/06/15 0445  WBC 17.7*  --  14.6*  HGB 8.0* 7.5* 7.0*  HCT 24.5* 22.0* 21.5*  PLT 71*  --  74*   BMET:  Recent Labs  08/05/15 0427 08/05/15 1726 08/06/15 0445  NA 137 136 136  K 4.6 4.0 4.2  CL 102  --  102  CO2 26  --  29  GLUCOSE 128* 120* 103*  BUN 24*  --  30*  CREATININE 1.00  --  0.91  CALCIUM 8.6*  --  8.2*    PT/INR:  Recent Labs  08/03/15 1115  LABPROT 12.5  INR 0.91   ABG    Component Value Date/Time   PHART 7.364 08/04/2015 0958   HCO3 24.5* 08/04/2015 0958   TCO2 23 08/04/2015 1641   ACIDBASEDEF 1.0 08/04/2015 0958   O2SAT 99.0 08/04/2015 0958   CBG (last 3)   Recent Labs  08/05/15 1122 08/05/15 1525 08/05/15 2132  GLUCAP 102* 100* 97    Assessment/Plan: S/P Procedure(s) (LRB): THORACIC ASCENDING ANEURYSM REPAIR (AAA) (N/A) -  POD # 3 repair type 1  dissection CV- BP remains soft.   In SR, but had brief episode of atrial fin this AM- will start PO amiodarone  RESP- CXR shows BB atelectasis, likely right pleural effusion  RENAL- lytes and creatinine OK  Continue diuresis as BP allows  ENDO- CBG OK- DC  Stress dose steroids- restart PO prednisone  Nutrition- taking PO  Anemia- secondary to ABL- Hgb down this AM- transfuse   Thrombocytopenia- stable, follow, no heparin, SCD for DVT prophylaxis  I will be away for the weekend, my partners will cover in my absence   LOS: 3 days    Ryan SlotSteven C Jashayla Glatfelter 08/06/2015

## 2015-08-06 NOTE — Care Management Note (Signed)
Case Management Note  Patient Details  Name: Ryan MorinDaryl V Cole MRN: [id number] Date of Birth: 03/14/1956  Subjective/Objective:       [facility] [place] - left message for call back              Action/Plan:   Expected Discharge Date:  08/10/15               Expected Discharge Plan:  Home/Self Care  In-House Referral:     Discharge planning Services  CM Consult  Post Acute Care Choice:    Choice offered to:     DME Arranged:    DME Agency:     HH Arranged:    HH Agency:     Status of Service:  In process, will continue to follow  Medicare Important Message Given:    Date Medicare IM Given:    Medicare IM give by:    Date Additional Medicare IM Given:    Additional Medicare Important Message give by:     If discussed at Long Length of Stay Meetings, dates discussed:    Additional Comments:  Vangie BickerBrown, Tanijah Morais Jane, RN 08/06/2015, 11:08 AM

## 2015-08-06 NOTE — Progress Notes (Signed)
Patient ID: Ryan MorinDaryl V Fang, male   DOB: 04/06/1956, 59 y.o.   MRN: [id number]  SICU Evening Rounds:  Hemodynamically stable  Had brief atrial fib this am and started on PO amio. Remains in sinus 98.  Urine output good.  Up in chair.

## 2015-08-07 ENCOUNTER — Inpatient Hospital Stay (HOSPITAL_COMMUNITY): Payer: Non-veteran care

## 2015-08-07 ENCOUNTER — Encounter (HOSPITAL_COMMUNITY): Payer: Self-pay | Admitting: Physical Medicine and Rehabilitation

## 2015-08-07 DIAGNOSIS — R5381 Other malaise: Secondary | ICD-10-CM

## 2015-08-07 DIAGNOSIS — D62 Acute posthemorrhagic anemia: Secondary | ICD-10-CM

## 2015-08-07 DIAGNOSIS — I10 Essential (primary) hypertension: Secondary | ICD-10-CM | POA: Diagnosis not present

## 2015-08-07 DIAGNOSIS — I4891 Unspecified atrial fibrillation: Secondary | ICD-10-CM

## 2015-08-07 DIAGNOSIS — G8918 Other acute postprocedural pain: Secondary | ICD-10-CM | POA: Diagnosis not present

## 2015-08-07 DIAGNOSIS — D696 Thrombocytopenia, unspecified: Secondary | ICD-10-CM

## 2015-08-07 LAB — TYPE AND SCREEN
ABO/RH(D): O POS
ANTIBODY SCREEN: POSITIVE
DAT, IGG: POSITIVE
Unit division: 0

## 2015-08-07 LAB — BASIC METABOLIC PANEL
ANION GAP: 10 (ref 5–15)
BUN: 23 mg/dL — ABNORMAL HIGH (ref 6–20)
CALCIUM: 8.8 mg/dL — AB (ref 8.9–10.3)
CO2: 29 mmol/L (ref 22–32)
Chloride: 101 mmol/L (ref 101–111)
Creatinine, Ser: 0.93 mg/dL (ref 0.61–1.24)
Glucose, Bld: 103 mg/dL — ABNORMAL HIGH (ref 65–99)
Potassium: 4.3 mmol/L (ref 3.5–5.1)
Sodium: 140 mmol/L (ref 135–145)

## 2015-08-07 LAB — CBC
HEMATOCRIT: 26.6 % — AB (ref 39.0–52.0)
HEMOGLOBIN: 8.6 g/dL — AB (ref 13.0–17.0)
MCH: 29.4 pg (ref 26.0–34.0)
MCHC: 32.3 g/dL (ref 30.0–36.0)
MCV: 90.8 fL (ref 78.0–100.0)
PLATELETS: 117 10*3/uL — AB (ref 150–400)
RBC: 2.93 MIL/uL — AB (ref 4.22–5.81)
RDW: 16.9 % — ABNORMAL HIGH (ref 11.5–15.5)
WBC: 13.9 10*3/uL — ABNORMAL HIGH (ref 4.0–10.5)

## 2015-08-07 NOTE — Consult Note (Signed)
Physical Medicine and Rehabilitation Consult   Reason for Consult:  Encephalopathy  Referring Physician: Dr. Tyrone Sage.    HPI: Ryan Cole is a 59 y.o. male with history of CVA, recent ulcerative colitis flare, HTN who was admitted on 08/02/15 with sudden onset of substernal radiating to head, neck and stomach. Cardiac enzymes negative and EKG with borderline ST changes. 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   Dr. Dorris Fetch was consulted and patient underwent aortic arch and root replacement on 10/17. Post op with bleeding and drop in platelets due to DIC and he was given 4 units FFP. He was extubated on 10/18 and was started on amiodarone due to post op A fib.  Reported to have issues with confusion. PT evaluation done yesterday. CIR recommended by MD and PT. Pt's wife at bedside, and states the pt has family and will be cared for 24/7 on discharge.    Review of Systems  Unable to perform ROS: mental status change      Past Medical History  Diagnosis Date  . 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 HYPERLIPIDEMIA 06/08/2007  . HYPOGONADISM, MALE 10/10/2007  . INSOMNIA-SLEEP DISORDER-UNSPEC 10/10/2007  . LOW BACK PAIN 06/08/2007  . WEAKNESS, LEFT SIDE OF BODY 10/10/2007  . Hypertension     Past Surgical History  Procedure Laterality Date  . Achilles    . Abdominal surgery  2014    removal of benign colon mass   . Thoracic aortic aneurysm repair N/A 08/02/2015    Procedure: THORACIC ASCENDING ANEURYSM REPAIR (AAA);  Surgeon: Loreli Slot, MD;  Location: [facility] OR;  Service: Open Heart Surgery;  Laterality: N/A;    Family History: Negative for ulcerative colitis.     Social History:  Married.  Used to be in [employer] and has worked for [employer] in the past. Naval architect X 3 years. Wife reports that he has never smoked.  He does not have any smokeless tobacco history on file. Wife reports that he does not drink alcohol or use illicit drugs.    Allergies: No Known Allergies    Medications Prior to Admission  Medication Sig Dispense Refill  . acetaminophen (TYLENOL) 500 MG tablet Take 1,000 mg by mouth every 6 (six) hours as needed for moderate pain.    Marland Kitchen aspirin 325 MG tablet Take 325 mg by mouth every 6 (six) hours as needed for moderate pain.    . ferrous sulfate 325 (65 FE) MG EC tablet Take 325 mg by mouth daily with breakfast.    . lisinopril (PRINIVIL,ZESTRIL) 10 MG tablet Take 5 mg by mouth daily.    . Magnesium 250 MG TABS Take 250 mg by mouth daily.    . Melatonin 3 MG TABS Take 6-9 mg by mouth at bedtime as needed (FOR SLEEP).    . mesalamine (APRISO) 0.375 G 24 hr capsule Take 1,500 mg by mouth daily.    . Multiple Vitamin (MULTIVITAMIN WITH MINERALS) TABS tablet Take 1 tablet by mouth daily.    Marland Kitchen OVER THE COUNTER MEDICATION Take 1 tablet by mouth. ZANTREX 3 FOR WHEN DRIVING    . Potassium 99 MG TABS Take 99 mg by mouth daily.    . predniSONE (DELTASONE) 20 MG tablet Take 30 mg by mouth daily with breakfast.      Home: Home Living Family/patient expects  to be discharged to:: Private residence Living Arrangements: Spouse/significant other Available Help at Discharge: Family, Other (Comment), Available 24 hours/day (wife, sister and another will provide 24 hour assist as need) Type of Home: House Additional Comments: no family to determine PLOF and home env.  pt with expressive difficulties.  Functional History: Prior Function Level of Independence: Independent Functional Status:  Mobility: Bed Mobility Overal bed mobility: Needs Assistance Bed Mobility: Supine to Sit Supine to sit: Min assist General bed mobility comments: stability assist and guiding cues Transfers Overall transfer level: Needs assistance Transfers: Sit to/from Stand Sit to Stand: Min assist General transfer  comment: stability assist Ambulation/Gait Ambulation/Gait assistance: Min assist Ambulation Distance (Feet): 300 Feet Assistive device: 1 person hand held assist Gait Pattern/deviations: Step-through pattern General Gait Details: mildly unsteady and guarded gait. Gait velocity: slower Gait velocity interpretation: Below normal speed for age/gender    ADL:    Cognition: Cognition Overall Cognitive Status: Impaired/Different from baseline Orientation Level: Oriented to person, Disoriented to place, Disoriented to time, Disoriented to situation (states name, birthday, home address over and over) Cognition Arousal/Alertness: Lethargic Behavior During Therapy: Flat affect Overall Cognitive Status: Impaired/Different from baseline Area of Impairment: Attention, Following commands, Safety/judgement, Awareness, Problem solving Current Attention Level: Sustained Following Commands: Follows one step commands with increased time, Follows one step commands inconsistently Safety/Judgement: Decreased awareness of safety, Decreased awareness of deficits Awareness: Intellectual Problem Solving: Slow processing, Requires tactile cues   Blood pressure 132/77, pulse 94, temperature 97.8 F (36.6 C), temperature source Oral, resp. rate 16, height 6' (1.829 m), weight 94.4 kg (208 lb 1.8 oz), SpO2 96 %. Physical Exam  Nursing note and vitals reviewed. Constitutional: He appears well-developed and well-nourished.  Flat affect with decreased eye contact.  Speaks in a monotone.   HENT:  Head: Normocephalic and atraumatic.  Eyes: Conjunctivae and EOM are normal. Pupils are equal, round, and reactive to light.  Neck: Normal range of motion. Neck supple.  Cardiovascular: Regular rhythm.  Tachycardia present.   Murmur heard. Dry dressing on sternum.   Respiratory: Effort normal and breath sounds normal. No respiratory distress. He has no wheezes.  GI: Soft. Bowel sounds are normal. He exhibits no  distension. There is no tenderness.  Musculoskeletal: He exhibits edema (bilateral hands). He exhibits no tenderness.  Strength b/l UE 4/5 grossly B/l LE 4/5 grossly  Neurological: He is alert.  Oriented to self only and slow to process.  Speech slow but clear with occasional perseveration. He needed cues for two step motor commands.  Significant cognitive deficits and lacks insight and awareness of deficits.   Skin: Skin is warm and dry.  Psychiatric: His affect is blunt. His speech is delayed. He is slowed. He exhibits abnormal recent memory and abnormal remote memory.    Results for orders placed or performed during the hospital encounter of 08/02/15 (from the past 24 hour(s))  Basic metabolic panel     Status: Abnormal   Collection Time: 08/07/15  2:20 AM  Result Value Ref Range   Sodium 140 135 - 145 mmol/L   Potassium 4.3 3.5 - 5.1 mmol/L   Chloride 101 101 - 111 mmol/L   CO2 29 22 - 32 mmol/L   Glucose, Bld 103 (H) 65 - 99 mg/dL   BUN 23 (H) 6 - 20 mg/dL   Creatinine, Ser 5.40 0.61 - 1.24 mg/dL   Calcium 8.8 (L) 8.9 - 10.3 mg/dL   GFR calc non Af Amer >60 >60 mL/min   GFR  calc Af Amer >60 >60 mL/min   Anion gap 10 5 - 15  CBC     Status: Abnormal   Collection Time: 08/07/15  2:20 AM  Result Value Ref Range   WBC 13.9 (H) 4.0 - 10.5 K/uL   RBC 2.93 (L) 4.22 - 5.81 MIL/uL   Hemoglobin 8.6 (L) 13.0 - 17.0 g/dL   HCT 04.5 (L) 40.9 - 81.1 %   MCV 90.8 78.0 - 100.0 fL   MCH 29.4 26.0 - 34.0 pg   MCHC 32.3 30.0 - 36.0 g/dL   RDW 91.4 (H) 78.2 - 95.6 %   Platelets 117 (L) 150 - 400 K/uL   Dg Chest Port 1 View  08/07/2015  CLINICAL DATA:  Aortic dissection . EXAM: PORTABLE CHEST 1 VIEW COMPARISON:  08/06/2015. FINDINGS: Interim removal right IJ sheath. Prior CABG. Cardiomegaly. Left lower lobe atelectasis and/or infiltrate. Small left pleural effusion unchanged. No pneumothorax. IMPRESSION: 1. Interval removal right IJ sheath. 2. Prior median sternotomy. Prior thoracic aneurysm  repair. Mediastinum is stable. 3. Persistent left lower lobe atelectasis and/or infiltrate. Small left pleural effusion. No interim change . Electronically Signed   By: Maisie Fus  Register   On: 08/07/2015 07:39   Dg Chest Port 1 View  08/06/2015  CLINICAL DATA:  Aortic dissection. EXAM: PORTABLE CHEST 1 VIEW COMPARISON:  08/05/2015. FINDINGS: Right IJ sheath in stable position. Interval removal of mediastinal drainage catheters. New prior median sternotomy and thoracic aneurysm repair. Mediastinum is stable. Stable cardiomegaly. Persistent left lower lobe atelectasis and/or infiltrate. Improving right base atelectasis. Small left pleural effusion. No pneumothorax. IMPRESSION: 1. Interval removal of mediastinal drainage catheters. Right IJ sheath in stable position. No pneumothorax. 2. Prior median sternotomy and thoracic aneurysm repair. Mediastinum is stable. Stable cardiomegaly. 3. Persistent left lower lobe atelectasis and/or infiltrate. Interim clearing of right base atelectasis. Small left pleural effusion . Electronically Signed   By: Maisie Fus  Register   On: 08/06/2015 07:54    Assessment/Plan: Diagnosis: Debility and Encephalopathy Labs and images independently reviewed.  Records reviewed and summated above.   1. Does the need for close, 24 hr/day medical supervision in concert with the patient's rehab needs make it unreasonable for this patient to be served in a less intensive setting? Potentially 2. Co-Morbidities requiring supervision/potential complications: HTN (monitor and provide prns in accordance with increased physical exertion and pain), tachypnea, post-op pain (attempt to manage with oral meds), ABLA (transfuse if necessary to ensure appropriate perfusion for increased activity tolerance), thrombocytopenia (consider holding therapies resistive exercises for plts < 60,000/mm) UC, A. Fib (Cont meds) 3. Due to safety, disease management, medication administration, pain management and  patient education, does the patient require 24 hr/day rehab nursing? Potentially 4. Does the patient require coordinated care of a physician, rehab nurse, PT (1-2 hrs/day, 5 days/week), OT (1-2 hrs/day, 5 days/week) and SLP (1-2 hrs/day, 5 days/week) to address physical and functional deficits in the context of the above medical diagnosis(es)? Potentially Addressing deficits in the following areas: strength, cognition, speech, language, swallowing and psychosocial support 5. Can the patient actively participate in an intensive therapy program of at least 3 hrs of therapy per day at least 5 days per week? Yes 6. The potential for patient to make measurable gains while on inpatient rehab is fair 7. Anticipated functional outcomes upon discharge from inpatient rehab are independent and modified independent  with PT, independent and modified independent with OT, independent with SLP. 8. Estimated rehab length of stay to reach the above functional  goals is: 8-12 days. 9. Does the patient have adequate social supports and living environment to accommodate these discharge functional goals? Yes 10. Anticipated D/C setting: Home 11. Anticipated post D/C treatments: HH therapy and Home excercise program 12. Overall Rehab/Functional Prognosis: excellent  RECOMMENDATIONS: This patient's condition is appropriate for continued rehabilitative care in the following setting: HH Therapy and Home Excercise Program Patient has agreed to participate in recommended program. Potentially Note that insurance prior authorization may be required for reimbursement for recommended care.  Comment: Pt will likely make significant functional improvement in a short period of time once acute mental status changes improve, as he is already ambulating 33300ft with Min A with PT.  Would recommend HH.  Maryla MorrowAnkit Derold Dorsch, MD 08/07/2015

## 2015-08-07 NOTE — Progress Notes (Signed)
Rehab Admissions Coordinator Note:  Patient was screened by Earnestine Tuohey M for appropriateneTrish Magess for an Inpatient Acute Rehab Consult.  I spoke with case manager, Maralyn SagoSarah.  Patient has VA and they do not authorize inpatient rehab.  They do authorize SNF and will usually pay for SNF.  At this time, we are recommending Skilled Nursing Facility.  Call me for questions.  Trish MageLogue, Motty Borin M 08/07/2015, 9:56 AM  I can be reached at [phone].

## 2015-08-07 NOTE — Progress Notes (Addendum)
[facility] with [facility] at [facility]. [facility]inda [facility] [phone] ext [phone] dc planning.  To start process - Faxed to her H&P, Op note, MD progress notes - recent and PT/OT notes up to this time at [phone] Vangie BickerBrown, Shraddha Lebron Jane [phone] 08/07/2015

## 2015-08-07 NOTE — Progress Notes (Signed)
Patient ID: Ryan Cole, male   DOB: 02-17-1956, 59 y.o.   MRN: [id number] TCTS DAILY ICU PROGRESS NOTE                   [address]            [place] [address]          [phone]   5 Days Post-Op Procedure(s) (LRB): THORACIC ASCENDING ANEURYSM REPAIR (AAA) (N/A)  Total Length of Stay:  LOS: 4 days   Subjective: Remains confused, [facility] at [facility], walked [facility] feet.  Objective: Vital signs in last 24 hours: Temp:  [97.8 F (36.6 C)-100.4 F (38 C)] 97.8 F (36.6 C) (10/21 0706) Pulse Rate:  [78-107] 85 (10/21 0700) Cardiac Rhythm:  [-] Normal sinus rhythm (10/21 0400) Resp:  [14-23] 14 (10/21 0700) BP: (99-137)/(57-79) 99/59 mmHg (10/21 0700) SpO2:  [94 %-100 %] 100 % (10/21 0700) Weight:  [208 lb 1.8 oz (94.4 kg)] 208 lb 1.8 oz (94.4 kg) (10/21 0500)  Filed Weights   08/05/15 0600 08/06/15 0500 08/07/15 0500  Weight: 210 lb 5.1 oz (95.4 kg) 208 lb 1.8 oz (94.4 kg) 208 lb 1.8 oz (94.4 kg)    Weight change: 0 lb (0 kg)   Hemodynamic parameters for last 24 hours:    Intake/Output from previous day: 10/20 0701 - 10/21 0700 In: 635 [P.O.:300; Blood:335] Out: 1540 [Urine:1540]  Intake/Output this shift:    Current Meds: Scheduled Meds: . acetaminophen  1,000 mg Oral 4 times per day   Or  . acetaminophen (TYLENOL) oral liquid 160 mg/5 mL  1,000 mg Per Tube 4 times per day  . amiodarone  400 mg Oral BID  . aspirin EC  325 mg Oral Daily   Or  . aspirin  324 mg Per Tube Daily  . bisacodyl  10 mg Oral Daily   Or  . bisacodyl  10 mg Rectal Daily  . docusate sodium  200 mg Oral Daily  . furosemide  20 mg Intravenous BID  . mesalamine  1.5 g Oral Daily  . metoCLOPramide  10 mg Oral TID AC  . metoprolol tartrate  12.5 mg Oral BID   Or  . metoprolol tartrate  12.5 mg Per Tube BID  . pantoprazole  40 mg Oral Daily  . predniSONE  30 mg Oral Q breakfast  . sodium chloride  3 mL Intravenous Q12H   Continuous Infusions: . sodium chloride  Stopped (08/06/15 0500)  . sodium chloride    . lactated ringers Stopped (08/04/15 1500)  . nitroGLYCERIN Stopped (08/03/15 0930)  . norepinephrine (LEVOPHED) Adult infusion Stopped (08/04/15 1700)  . phenylephrine (NEO-SYNEPHRINE) Adult infusion Stopped (08/03/15 0930)   PRN Meds:.sodium chloride, aspirin, metoprolol, morphine injection, ondansetron (ZOFRAN) IV, oxyCODONE, sodium chloride, traMADol  General appearance: alert, cooperative and slowed mentation Neurologic: intact Heart: regular rate and rhythm, S1, S2 normal, no murmur, click, rub or gallop Lungs: diminished breath sounds bibasilar Abdomen: soft, non-tender; bowel sounds normal; no masses,  no organomegaly Extremities: extremities normal, atraumatic, no cyanosis or edema and Homans sign is negative, no sign of DVT Wound: sternum intact  Lab Results: CBC: Recent Labs  08/06/15 0445 08/07/15 0220  WBC 14.6* 13.9*  HGB 7.0* 8.6*  HCT 21.5* 26.6*  PLT 74* 117*   BMET:  Recent Labs  08/06/15 0445 08/07/15 0220  NA 136 140  K 4.2 4.3  CL 102 101  CO2 29 29  GLUCOSE 103* 103*  BUN  30* 23*  CREATININE 0.91 0.93  CALCIUM 8.2* 8.8*    PT/INR: No results for input(s): LABPROT, INR in the last 72 hours. Radiology: Dg Chest Port 1 View  08/07/2015  CLINICAL DATA:  Aortic dissection . EXAM: PORTABLE CHEST 1 VIEW COMPARISON:  08/06/2015. FINDINGS: Interim removal right IJ sheath. Prior CABG. Cardiomegaly. Left lower lobe atelectasis and/or infiltrate. Small left pleural effusion unchanged. No pneumothorax. IMPRESSION: 1. Interval removal right IJ sheath. 2. Prior median sternotomy. Prior thoracic aneurysm repair. Mediastinum is stable. 3. Persistent left lower lobe atelectasis and/or infiltrate. Small left pleural effusion. No interim change . Electronically Signed   By: Maisie Fushomas  Register   On: 08/07/2015 07:39     Assessment/Plan: S/P Procedure(s) (LRB): THORACIC ASCENDING ANEURYSM REPAIR (AAA)  (N/A) Mobilize Continue PT cognitive problems, confused, consider ot ,pt and in patient rehab in near future   Delight Ovensdward B Marabella Popiel 08/07/2015 8:08 AM

## 2015-08-07 NOTE — Progress Notes (Signed)
Patient ID: Alvester MorinDaryl V Lackman, male   DOB: 01/25/1956, 59 y.o.   MRN: [id number] EVENING ROUNDS NOTE :     [address]       [place] [id number]             [phone]                 5 Days Post-Op Procedure(s) (LRB): THORACIC ASCENDING ANEURYSM REPAIR (AAA) (N/A)  Total Length of Stay:  LOS: 4 days  BP 157/79 mmHg  Pulse 93  Temp(Src) 98.3 F (36.8 C) (Oral)  Resp 25  Ht 6' (1.829 m)  Wt 208 lb 1.8 oz (94.4 kg)  BMI 28.22 kg/m2  SpO2 100%  .Intake/Output      10/21 0701 - 10/22 0700   P.O. 1080   Blood    Total Intake(mL/kg) 1080 (11.4)   Urine (mL/kg/hr)    Total Output     Net +1080         . sodium chloride Stopped (08/06/15 0500)  . sodium chloride    . lactated ringers Stopped (08/04/15 1500)  . nitroGLYCERIN Stopped (08/03/15 0930)  . norepinephrine (LEVOPHED) Adult infusion Stopped (08/04/15 1700)  . phenylephrine (NEO-SYNEPHRINE) Adult infusion Stopped (08/03/15 0930)     Lab Results  Component Value Date   WBC 13.9* 08/07/2015   HGB 8.6* 08/07/2015   HCT 26.6* 08/07/2015   PLT 117* 08/07/2015   GLUCOSE 103* 08/07/2015   ALT 69* 08/03/2015   AST 121* 08/03/2015   NA 140 08/07/2015   K 4.3 08/07/2015   CL 101 08/07/2015   CREATININE 0.93 08/07/2015   BUN 23* 08/07/2015   CO2 29 08/07/2015   PSA 1.0 12/16/2005   INR 0.91 08/03/2015   Mental status improving , talking with family tonight   Delight Ovensdward B Dyonna Jaspers MD  Beeper [phone] Office [phone] 08/07/2015 9:27 PM

## 2015-08-07 NOTE — Evaluation (Signed)
Speech Language Pathology Evaluation Patient Details Name: Ryan Cole MRN: [id number] DOB: 03/13/1956 Today's Date: 08/07/2015 Time: 1535-1600 SLP Time Calculation (min) (ACUTE ONLY): 25 min  Problem List:  Patient Active Problem List   Diagnosis Date Noted  . Thoracic aortic dissection (HCC) 08/03/2015  . Abdominal pain, other specified site 05/30/2011  . Preventative health care 05/28/2011  . HYPOGONADISM, MALE 10/10/2007  . INSOMNIA-SLEEP DISORDER-UNSPEC 10/10/2007  . 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 WEAKNESS, LEFT SIDE OF BODY 10/10/2007  . HYPERLIPIDEMIA 06/08/2007  . LOW BACK PAIN 06/08/2007  . CEREBROVASCULAR ACCIDENT, HX OF 06/08/2007   Past Medical History:  Past Medical History  Diagnosis Date  . 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 HYPERLIPIDEMIA 06/08/2007  . HYPOGONADISM, MALE 10/10/2007  . INSOMNIA-SLEEP DISORDER-UNSPEC 10/10/2007  . LOW BACK PAIN 06/08/2007  . WEAKNESS, LEFT SIDE OF BODY 10/10/2007  . Hypertension    Past Surgical History:  Past Surgical History  Procedure Laterality Date  . Achilles    . Abdominal surgery  2014    removal of benign colon mass   . Thoracic aortic aneurysm repair N/A 08/02/2015    Procedure: THORACIC ASCENDING ANEURYSM REPAIR (AAA);  Surgeon: Loreli SlotSteven C Hendrickson, MD;  [facility];  Service: Open Heart Surgery;  Laterality: N/A;   HPI:  Pt is a 59 y/o male admitted with symptoms due to newly found Thoracic abdominal aneurysm. Pt to OR for emergent TAA repair. Bleeding complications post op. Extubated 10/18.    Assessment / Plan / Recommendation Clinical Impression  Patient presents with expressive and receptive aphasia as well as cognitive impairments. Patient exhibits perseveration on responses (with awareness at basic level but not at more complex verbal level), decreased processing speed, auditory comprehension deficits for factual yes/no questions and following 3-step commands. Patient  was able to name 9 animals in 60 seconds, however could not initiate until SLP gave him cue "dog".  Patient's ability to complete basic and complex problem solving, as well as category identifcation tasks was impacted by perseveration on previous responses and patient's lack of elaboration upon responses.     SLP Assessment  Patient needs continued Speech Lanaguage Pathology Services    Follow Up Recommendations  Outpatient SLP;Inpatient Rehab    Frequency and Duration min 2x/week  2 weeks   Pertinent Vitals/Pain Pain Assessment: No/denies pain   SLP Goals  Patient/Family Stated Goal: patient is willing to do the work to get better Potential to Achieve Goals (ACUTE ONLY): Good  SLP Evaluation Prior Functioning  Cognitive/Linguistic Baseline: Information not available (No family present, but [facility])  Lives With: Spouse Available Help at Discharge: Family;Other (Comment);Available 24 hours/day Vocation:  (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[employer])   Cognition  Overall Cognitive Status: Impaired/Different from baseline Arousal/Alertness: Awake/alert Orientation Level: Oriented to person;Oriented to situation;Disoriented to time;Disoriented to place Attention: Sustained;Selective Sustained Attention: Appears intact Selective Attention: Impaired Selective Attention Impairment: Verbal complex Memory: Impaired Memory Impairment: Retrieval deficit;Decreased recall of new information Awareness: Impaired Awareness Impairment: Emergent impairment Problem Solving: Impaired Problem Solving Impairment: Verbal complex;Verbal basic Executive Function: Organizing;Initiating;Self Monitoring;Self Correcting Organizing: Impaired Organizing Impairment: Verbal complex Initiating: Impaired Initiating Impairment: Verbal complex Self Monitoring: Impaired Self Monitoring Impairment: Verbal complex;Verbal basic Self Correcting: Impaired Self Correcting  Impairment: Verbal complex Behaviors: Perseveration Safety/Judgment: Appears intact    Comprehension  Auditory Comprehension Overall Auditory Comprehension: Impaired Yes/No Questions: Impaired Basic Immediate Environment Questions: 75-100% accurate Complex Questions: 50-74% accurate Commands:  Impaired Multistep Basic Commands: 50-74% accurate Conversation: Simple Interfering Components: Processing speed EffectiveTechniques: Extra processing time;Repetition;Stressing words Reading Comprehension Reading Status: Not tested    Expression Expression Primary Mode of Expression: Verbal Verbal Expression Overall Verbal Expression: Impaired Initiation: No impairment Level of Generative/Spontaneous Verbalization: Sentence;Conversation Naming: Impairment Responsive: 76-100% accurate Confrontation: Within functional limits Divergent: 25-49% accurate Verbal Errors: Perseveration;Aware of errors;Other (comment) (aware of errors at basic level but not for more complex verbal) Pragmatics: Impairment Impairments: Monotone Effective Techniques: Open ended questions;Semantic cues Non-Verbal Means of Communication: Not applicable   Oral / Motor Oral Motor/Sensory Function Overall Oral Motor/Sensory Function: Appears within functional limits for tasks assessed Motor Speech Overall Motor Speech: Appears within functional limits for tasks assessed   GO     Ryan Cole 08/07/2015, 5:07 PM  Angela Nevin, MA, CCC-SLP 08/07/2015 5:07 PM

## 2015-08-08 LAB — CBC
HCT: 24.1 % — ABNORMAL LOW (ref 39.0–52.0)
Hemoglobin: 8 g/dL — ABNORMAL LOW (ref 13.0–17.0)
MCH: 29.6 pg (ref 26.0–34.0)
MCHC: 33.2 g/dL (ref 30.0–36.0)
MCV: 89.3 fL (ref 78.0–100.0)
Platelets: 155 10*3/uL (ref 150–400)
RBC: 2.7 MIL/uL — ABNORMAL LOW (ref 4.22–5.81)
RDW: 16.3 % — ABNORMAL HIGH (ref 11.5–15.5)
WBC: 13.5 10*3/uL — ABNORMAL HIGH (ref 4.0–10.5)

## 2015-08-08 LAB — BASIC METABOLIC PANEL
Anion gap: 12 (ref 5–15)
BUN: 28 mg/dL — ABNORMAL HIGH (ref 6–20)
CO2: 25 mmol/L (ref 22–32)
Calcium: 9.1 mg/dL (ref 8.9–10.3)
Chloride: 100 mmol/L — ABNORMAL LOW (ref 101–111)
Creatinine, Ser: 0.84 mg/dL (ref 0.61–1.24)
GFR calc Af Amer: >60 mL/min (ref >60)
GFR calc non Af Amer: >60 mL/min (ref >60)
Glucose, Bld: 101 mg/dL — ABNORMAL HIGH (ref 65–99)
Potassium: 3.7 mmol/L (ref 3.5–5.1)
Sodium: 137 mmol/L (ref 135–145)

## 2015-08-08 LAB — GLUCOSE, CAPILLARY
Glucose-Capillary: 107 mg/dL — ABNORMAL HIGH (ref 65–99)
Glucose-Capillary: 98 mg/dL (ref 65–99)
Glucose-Capillary: 89 mg/dL (ref 65–99)

## 2015-08-08 MED ORDER — SODIUM CHLORIDE 0.9 % IV SOLN: 250.0000 mL | INTRAVENOUS | Status: DC | PRN

## 2015-08-08 MED ORDER — MOVING RIGHT ALONG BOOK
Freq: Once | Status: AC
  Administered 2015-08-08: 12:00:00
  Filled 2015-08-08: qty 1

## 2015-08-08 MED ORDER — SODIUM CHLORIDE 0.9 % IJ SOLN: 3.0000 mL | INTRAMUSCULAR | Status: DC | PRN

## 2015-08-08 MED ORDER — BISACODYL 10 MG RE SUPP: 10.0000 mg | Freq: Every day | RECTAL | Status: DC | PRN

## 2015-08-08 MED ORDER — METOPROLOL TARTRATE 12.5 MG HALF TABLET
12.5000 mg | ORAL_TABLET | Freq: Two times a day (BID) | ORAL | Status: DC
  Administered 2015-08-08: 12.5 mg via ORAL
  Filled 2015-08-08 (×3): qty 1

## 2015-08-08 MED ORDER — TRAMADOL HCL 50 MG PO TABS
50.0000 mg | ORAL_TABLET | ORAL | Status: DC | PRN
  Administered 2015-08-09 – 2015-08-16 (×2): 100 mg via ORAL
  Filled 2015-08-08 (×2): qty 2

## 2015-08-08 MED ORDER — TAMSULOSIN HCL 0.4 MG PO CAPS
0.4000 mg | ORAL_CAPSULE | Freq: Every day | ORAL | Status: DC
  Administered 2015-08-08 – 2015-08-16 (×9): 0.4 mg via ORAL
  Filled 2015-08-08 (×10): qty 1

## 2015-08-08 MED ORDER — AMIODARONE HCL 200 MG PO TABS
200.0000 mg | ORAL_TABLET | Freq: Two times a day (BID) | ORAL | Status: DC
  Administered 2015-08-08: 200 mg via ORAL
  Filled 2015-08-08 (×2): qty 1

## 2015-08-08 MED ORDER — PANTOPRAZOLE SODIUM 40 MG PO TBEC
40.0000 mg | DELAYED_RELEASE_TABLET | Freq: Every day | ORAL | Status: DC
  Administered 2015-08-09 – 2015-08-16 (×7): 40 mg via ORAL
  Filled 2015-08-08 (×7): qty 1

## 2015-08-08 MED ORDER — SODIUM CHLORIDE 0.9 % IJ SOLN
3.0000 mL | Freq: Two times a day (BID) | INTRAMUSCULAR | Status: DC
  Administered 2015-08-08 – 2015-08-11 (×7): 3 mL via INTRAVENOUS

## 2015-08-08 MED ORDER — BISACODYL 5 MG PO TBEC: 10.0000 mg | DELAYED_RELEASE_TABLET | Freq: Every day | ORAL | Status: DC | PRN

## 2015-08-08 MED ORDER — OXYCODONE HCL 5 MG PO TABS
5.0000 mg | ORAL_TABLET | ORAL | Status: DC | PRN
  Administered 2015-08-09 – 2015-08-10 (×4): 10 mg via ORAL
  Administered 2015-08-11: 5 mg via ORAL
  Administered 2015-08-11 – 2015-08-14 (×2): 10 mg via ORAL
  Filled 2015-08-08 (×2): qty 2
  Filled 2015-08-08: qty 1
  Filled 2015-08-08 (×5): qty 2

## 2015-08-08 MED ORDER — ONDANSETRON HCL 4 MG PO TABS: 4.0000 mg | ORAL_TABLET | Freq: Four times a day (QID) | ORAL | Status: DC | PRN

## 2015-08-08 MED ORDER — INSULIN ASPART 100 UNIT/ML ~~LOC~~ SOLN: 0.0000 [IU] | Freq: Three times a day (TID) | SUBCUTANEOUS | Status: DC

## 2015-08-08 MED ORDER — ONDANSETRON HCL 4 MG/2ML IJ SOLN: 4.0000 mg | Freq: Four times a day (QID) | INTRAMUSCULAR | Status: DC | PRN

## 2015-08-08 MED ORDER — ENOXAPARIN SODIUM 40 MG/0.4ML ~~LOC~~ SOLN
40.0000 mg | SUBCUTANEOUS | Status: DC
  Administered 2015-08-08 – 2015-08-10 (×3): 40 mg via SUBCUTANEOUS
  Filled 2015-08-08 (×3): qty 0.4

## 2015-08-08 MED ORDER — ASPIRIN EC 81 MG PO TBEC
81.0000 mg | DELAYED_RELEASE_TABLET | Freq: Every day | ORAL | Status: DC
  Administered 2015-08-08 – 2015-08-16 (×9): 81 mg via ORAL
  Filled 2015-08-08 (×9): qty 1

## 2015-08-08 NOTE — Progress Notes (Addendum)
Patient ID: Ryan Cole, male   DOB: January 14, 1956, 58 y.o.   MRN: [id number] TCTS DAILY ICU PROGRESS NOTE                   [address]            [place] [address]          [phone]   6 Days Post-Op Procedure(s) (LRB): THORACIC ASCENDING ANEURYSM REPAIR (AAA) (N/A)  Total Length of Stay:  LOS: 5 days   Subjective: More alert and clear,   Objective: Vital signs in last 24 hours: Temp:  [97.8 F (36.6 C)-98.3 F (36.8 C)] 97.9 F (36.6 C) (10/22 0700) Pulse Rate:  [81-104] 94 (10/22 0900) Cardiac Rhythm:  [-] Normal sinus rhythm (10/22 0800) Resp:  [9-25] 19 (10/22 0900) BP: (107-171)/(60-81) 112/60 mmHg (10/22 0900) SpO2:  [96 %-100 %] 100 % (10/22 0900) Weight:  [207 lb 3.7 oz (94 kg)] 207 lb 3.7 oz (94 kg) (10/22 0500)  Filed Weights   08/06/15 0500 08/07/15 0500 08/08/15 0500  Weight: 208 lb 1.8 oz (94.4 kg) 208 lb 1.8 oz (94.4 kg) 207 lb 3.7 oz (94 kg)    Weight change: -14.1 oz (-0.4 kg)   Hemodynamic parameters for last 24 hours:    Intake/Output from previous day: 10/21 0701 - 10/22 0700 In: 1180 [P.O.:1180] Out: 1325 [Urine:1325]  Intake/Output this shift: Total I/O In: 960 [P.O.:960] Out: -   Current Meds: Scheduled Meds: . acetaminophen  1,000 mg Oral 4 times per day   Or  . acetaminophen (TYLENOL) oral liquid 160 mg/5 mL  1,000 mg Per Tube 4 times per day  . amiodarone  400 mg Oral BID  . aspirin EC  325 mg Oral Daily   Or  . aspirin  324 mg Per Tube Daily  . bisacodyl  10 mg Oral Daily   Or  . bisacodyl  10 mg Rectal Daily  . docusate sodium  200 mg Oral Daily  . furosemide  20 mg Intravenous BID  . mesalamine  1.5 g Oral Daily  . metoCLOPramide  10 mg Oral TID AC  . metoprolol tartrate  12.5 mg Oral BID   Or  . metoprolol tartrate  12.5 mg Per Tube BID  . pantoprazole  40 mg Oral Daily  . predniSONE  30 mg Oral Q breakfast  . sodium chloride  3 mL Intravenous Q12H  . tamsulosin  0.4 mg Oral QPC breakfast    Continuous Infusions: . sodium chloride Stopped (08/06/15 0500)  . sodium chloride    . lactated ringers Stopped (08/04/15 1500)  . nitroGLYCERIN Stopped (08/03/15 0930)  . norepinephrine (LEVOPHED) Adult infusion Stopped (08/04/15 1700)  . phenylephrine (NEO-SYNEPHRINE) Adult infusion Stopped (08/03/15 0930)   PRN Meds:.sodium chloride, aspirin, metoprolol, morphine injection, ondansetron (ZOFRAN) IV, oxyCODONE, sodium chloride, traMADol  General appearance: alert and cooperative Neurologic: intact Heart: regular rate and rhythm, S1, S2 normal, no murmur, click, rub or gallop Lungs: clear to auscultation bilaterally Abdomen: soft, non-tender; bowel sounds normal; no masses,  no organomegaly Extremities: extremities normal, atraumatic, no cyanosis or edema and Homans sign is negative, no sign of DVT Wound: sternum stable  Lab Results: CBC: Recent Labs  08/07/15 0220 08/08/15 0530  WBC 13.9* 13.5*  HGB 8.6* 8.0*  HCT 26.6* 24.1*  PLT 117* 155   BMET:  Recent Labs  08/07/15 0220 08/08/15 0530  NA 140 137  K 4.3 3.7  CL 101 100*  CO2  29 25  GLUCOSE 103* 101*  BUN 23* 28*  CREATININE 0.93 0.84  CALCIUM 8.8* 9.1    PT/INR: No results for input(s): LABPROT, INR in the last 72 hours. Radiology: No results found.   Assessment/Plan: S/P Procedure(s) (LRB): THORACIC ASCENDING ANEURYSM REPAIR (AAA) (N/A) Mobilize Diuresis Plan for transfer to step-down: see transfer orders Had i/o cath early am with residual, started on flo max monitor urine output     [name] 08/08/2015 10:41 AM

## 2015-08-08 NOTE — Progress Notes (Signed)
Pt still unable to void. Dr. Tyrone SageGerhardt notified and orders received. In & out cath done with return of 500 ml clear yellow urine. Pt expresses relief. Per Dr Tyrone SageGerhardt next shift can bladder scan in 8 hours and insert an indwelling foley catheter if still unable to void and bladder full, or sooner if discomfort.

## 2015-08-08 NOTE — Progress Notes (Signed)
Pt received to room 2W21 via wheel chair. Assisted to bed for a nap as he did not sleep well last night. Wife at bedside. Cardiac telemetry monitor applied, CCMD notified. Pt and wife oriented to room and call system. Pt denies any discomfort or needs at this time.

## 2015-08-08 NOTE — Progress Notes (Signed)
Pt attempted to void without any results. Bladder scan indicated 458 ml. Pt requests to try again prior to intervention.

## 2015-08-09 ENCOUNTER — Inpatient Hospital Stay (HOSPITAL_COMMUNITY): Payer: Non-veteran care

## 2015-08-09 ENCOUNTER — Encounter (HOSPITAL_COMMUNITY): Payer: Self-pay | Admitting: Cardiology

## 2015-08-09 DIAGNOSIS — I7101 Dissection of thoracic aorta: Secondary | ICD-10-CM | POA: Diagnosis not present

## 2015-08-09 DIAGNOSIS — K5190 Ulcerative colitis, unspecified, without complications: Secondary | ICD-10-CM | POA: Diagnosis present

## 2015-08-09 DIAGNOSIS — I214 Non-ST elevation (NSTEMI) myocardial infarction: Secondary | ICD-10-CM | POA: Diagnosis not present

## 2015-08-09 DIAGNOSIS — I4891 Unspecified atrial fibrillation: Secondary | ICD-10-CM | POA: Diagnosis not present

## 2015-08-09 DIAGNOSIS — I10 Essential (primary) hypertension: Secondary | ICD-10-CM | POA: Diagnosis not present

## 2015-08-09 DIAGNOSIS — I5021 Acute systolic (congestive) heart failure: Secondary | ICD-10-CM | POA: Diagnosis not present

## 2015-08-09 DIAGNOSIS — I480 Paroxysmal atrial fibrillation: Secondary | ICD-10-CM | POA: Diagnosis not present

## 2015-08-09 DIAGNOSIS — D65 Disseminated intravascular coagulation [defibrination syndrome]: Secondary | ICD-10-CM | POA: Diagnosis not present

## 2015-08-09 LAB — LIPID PANEL
Cholesterol: 140 mg/dL (ref 0–200)
HDL: 46 mg/dL (ref >40)
LDL CALC: 70 mg/dL (ref 0–99)
TRIGLYCERIDES: 118 mg/dL (ref <150)
Total CHOL/HDL Ratio: 3 RATIO
VLDL: 24 mg/dL (ref 0–40)

## 2015-08-09 LAB — CBC
HCT: 25.1 % — ABNORMAL LOW (ref 39.0–52.0)
Hemoglobin: 8.5 g/dL — ABNORMAL LOW (ref 13.0–17.0)
MCH: 30 pg (ref 26.0–34.0)
MCHC: 33.9 g/dL (ref 30.0–36.0)
MCV: 88.7 fL (ref 78.0–100.0)
Platelets: 210 10*3/uL (ref 150–400)
RBC: 2.83 MIL/uL — ABNORMAL LOW (ref 4.22–5.81)
RDW: 16.3 % — ABNORMAL HIGH (ref 11.5–15.5)
WBC: 13.4 10*3/uL — ABNORMAL HIGH (ref 4.0–10.5)

## 2015-08-09 LAB — TROPONIN I
TROPONIN I: 1.34 ng/mL — AB (ref <0.031)
TROPONIN I: 1.06 ng/mL — AB (ref <0.031)
Troponin I: 1.69 ng/mL (ref <0.031)

## 2015-08-09 LAB — GLUCOSE, CAPILLARY
GLUCOSE-CAPILLARY: 93 mg/dL (ref 65–99)
Glucose-Capillary: 81 mg/dL (ref 65–99)
Glucose-Capillary: 107 mg/dL — ABNORMAL HIGH (ref 65–99)
Glucose-Capillary: 85 mg/dL (ref 65–99)

## 2015-08-09 LAB — BASIC METABOLIC PANEL
Anion gap: 17 — ABNORMAL HIGH (ref 5–15)
BUN: 24 mg/dL — ABNORMAL HIGH (ref 6–20)
CO2: 18 mmol/L — ABNORMAL LOW (ref 22–32)
Calcium: 9.5 mg/dL (ref 8.9–10.3)
Chloride: 107 mmol/L (ref 101–111)
Creatinine, Ser: 0.95 mg/dL (ref 0.61–1.24)
GFR calc Af Amer: >60 mL/min (ref >60)
GFR calc non Af Amer: >60 mL/min (ref >60)
Glucose, Bld: 110 mg/dL — ABNORMAL HIGH (ref 65–99)
Potassium: 4.4 mmol/L (ref 3.5–5.1)
Sodium: 142 mmol/L (ref 135–145)

## 2015-08-09 LAB — TSH: TSH: 3.404 u[IU]/mL (ref 0.350–4.500)

## 2015-08-09 MED ORDER — AMIODARONE HCL 200 MG PO TABS
400.0000 mg | ORAL_TABLET | Freq: Two times a day (BID) | ORAL | Status: DC
  Administered 2015-08-09 – 2015-08-16 (×15): 400 mg via ORAL
  Filled 2015-08-09 (×16): qty 2

## 2015-08-09 MED ORDER — METOPROLOL TARTRATE 25 MG PO TABS
25.0000 mg | ORAL_TABLET | Freq: Two times a day (BID) | ORAL | Status: DC
  Administered 2015-08-09 (×2): 25 mg via ORAL
  Filled 2015-08-09 (×3): qty 1

## 2015-08-09 NOTE — Progress Notes (Signed)
Troponin result called to Dr. SwazilandJordan. No new orders.

## 2015-08-09 NOTE — Consult Note (Signed)
CARDIOLOGY CONSULT NOTE     Patient ID: Ryan Cole MRN: [id number] DOB/AGE: October 06, 1956 59 y.o.  Admit date: 08/02/2015 Referring Physician Ofilia Neas MD Primary Physician Oliver Barre, MD Primary Cardiologist N/A Reason for Consultation Atrial fibrillation. 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 Underwent emergent surgery on 08/02/15 with total aortic arch and root replacement, resuspension of the native AV, and reimplantation of the coronary arteries. Post op coagulopathy and bleeding resolved with FFP. On 10/20 noted to have transient Afib and was placed on amiodarone. This am went into Afib with rate up to 140. Now in atrial flutter with rate 113. Patient feels weak. Has not been able to rest or get comfortable. Has chest wall pain that has been constant since surgery and is unchanged today. Denies dyspnea or other chest pain. No prior cardiac history. Has history of remote TIA and history of HTN.   Past Medical History  Diagnosis Date  . 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 HYPERLIPIDEMIA 06/08/2007  . HYPOGONADISM, MALE 10/10/2007  . INSOMNIA-SLEEP DISORDER-UNSPEC 10/10/2007  . LOW BACK PAIN 06/08/2007  . WEAKNESS, LEFT SIDE OF BODY 10/10/2007  . Hypertension     Family History  Problem Relation Age of Onset  . Heart failure Father 45    Social History   Social History  . Marital Status: Married    Spouse Name: N/A  . Number of Children: 0  . Years of Education: N/A   Occupational History  . truck driver    Social History Main Topics  . Smoking status: Never Smoker   . Smokeless tobacco: Not on file  . Alcohol Use: No  . Drug Use: No  . Sexual Activity: Not on file   Other Topics Concern  . Not on file   Social History Narrative    Past Surgical History  Procedure Laterality Date  . Achilles    . Abdominal surgery  2014    removal of benign colon mass   . Thoracic  aortic aneurysm repair N/A 08/02/2015    Procedure: THORACIC ASCENDING ANEURYSM REPAIR (AAA);  Surgeon: Loreli Slot, MD;  Location: [facility] OR;  Service: Open Heart Surgery;  Laterality: N/A;     Prescriptions prior to admission  Medication Sig Dispense Refill Last Dose  . acetaminophen (TYLENOL) 500 MG tablet Take 1,000 mg by mouth every 6 (six) hours as needed for moderate pain.   Past Week at Unknown time  . aspirin 325 MG tablet Take 325 mg by mouth every 6 (six) hours as needed for moderate pain.   08/02/2015 at Unknown time  . ferrous sulfate 325 (65 FE) MG EC tablet Take 325 mg by mouth daily with breakfast.   08/02/2015 at Unknown time  . lisinopril (PRINIVIL,ZESTRIL) 10 MG tablet Take 5 mg by mouth daily.   08/02/2015 at Unknown time  . Magnesium 250 MG TABS Take 250 mg by mouth daily.   08/02/2015 at Unknown time  . Melatonin 3 MG TABS Take 6-9 mg by mouth at bedtime as needed (FOR SLEEP).   Past Week at Unknown time  . mesalamine (APRISO) 0.375 G 24 hr capsule Take 1,500 mg by mouth daily.   08/02/2015 at Unknown time  . Multiple Vitamin (MULTIVITAMIN WITH MINERALS) TABS tablet Take 1 tablet by mouth daily.   08/02/2015 at Unknown time  . OVER THE COUNTER MEDICATION Take 1 tablet by mouth. ZANTREX 3 FOR  WHEN DRIVING   09/81/191410/16/2016 at Unknown time  . Potassium 99 MG TABS Take 99 mg by mouth daily.   Past Week at Unknown time  . predniSONE (DELTASONE) 20 MG tablet Take 30 mg by mouth daily with breakfast.   08/02/2015 at Unknown time     ROS: As noted in HPI. Developed urinary retention and required foley placement last night. History of ulcerative colitis with recent flair. Had some bleeding noted one month ago. All other systems are reviewed and are negative unless otherwise mentioned.   Physical Exam: Blood pressure 117/81, pulse 99, temperature 98.4 F (36.9 C), temperature source Oral, resp. rate 18, height 6' (1.829 m), weight 92.171 kg (203 lb 3.2 oz), SpO2 100 %.  Current  Weight  08/09/15 92.171 kg (203 lb 3.2 oz)  05/30/11 106.142 kg (234 lb)  02/22/08 100.699 kg (222 lb)    GENERAL:  Well appearing BM in NAD. HEENT:  PERRL, EOMI, sclera are clear. Oropharynx is clear. NECK:  No jugular venous distention, carotid upstroke brisk and symmetric, no bruits, no thyromegaly or adenopathy LUNGS:  Clear to auscultation bilaterally CHEST: Healing median sternotomy scar. Tender HEART:  IRRR,  PMI not displaced or sustained,S1 and S2 within normal limits, no S3, no S4: no clicks, no rubs, no murmurs ABD:  Soft, nontender. BS +, no masses or bruits. No hepatomegaly, no splenomegaly EXT:  2 + pulses throughout, trace edema, no cyanosis no clubbing SKIN:  Warm and dry.  No rashes NEURO:  Alert and oriented x 3. Cranial nerves II through XII intact. PSYCH:  Cognitively intact    Labs:   Lab Results  Component Value Date   WBC 13.5* 08/08/2015   HGB 8.0* 08/08/2015   HCT 24.1* 08/08/2015   MCV 89.3 08/08/2015   PLT 155 08/08/2015    Recent Labs Lab 08/03/15 1950  08/08/15 0530  NA 137  < > 137  K 5.2*  < > 3.7  CL 109  < > 100*  CO2 23  < > 25  BUN 14  < > 28*  CREATININE 1.03  < > 0.84  CALCIUM 7.8*  < > 9.1  PROT 4.4*  --   --   BILITOT 0.7  --   --   ALKPHOS 31*  --   --   ALT 69*  --   --   AST 121*  --   --   GLUCOSE 105*  < > 101*  < > = values in this interval not displayed. No results found for: CKTOTAL, CKMB, CKMBINDEX, TROPONINI No results found for: CHOL No results found for: HDL No results found for: LDLCALC No results found for: TRIG No results found for: CHOLHDL No results found for: LDLDIRECT  No results found for: PROBNP No results found for: TSH No results found for: HGBA1C  Radiology: PORTABLE CHEST 1 VIEW  COMPARISON: 08/06/2015.  FINDINGS: Interim removal right IJ sheath. Prior CABG. Cardiomegaly. Left lower lobe atelectasis and/or infiltrate. Small left pleural effusion unchanged. No  pneumothorax.  IMPRESSION: 1. Interval removal right IJ sheath. 2. Prior median sternotomy. Prior thoracic aneurysm repair. Mediastinum is stable. 3. Persistent left lower lobe atelectasis and/or infiltrate. Small left pleural effusion. No interim change .   Electronically Signed  By: Maisie Fushomas Register  EKG: atrial flutter rate 112. Nonspecific TWA. No criteria for STEMI.   ASSESSMENT AND PLAN:  1. Atrial fibrillation/flutter post op thoracic surgery. Minimally symptomatic. No Ecg criteria for STEMI. CHAD-vasc score of 3-4 with HASBLED  score of 3. Would avoid anticoagulation at this point given fairly recent GI bleeding from UC. Will increase amiodarone to 400 mg bid. Increase metoprolol to 25 mg bid. Hopefully will convert to NSR. Will check TSH. Check serial troponins. Doubt ischemia. Repeat Echo. 2. 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 S/p surgery as noted. 3. HTN controlled. 4. Ulcerative colitis. Consider tapering steroids to maintenance dose.  5. Remote TIA 6. Urinary retention- now on Flomax. 7. ? Hyperlipidemia. No record of lipid panel- will order.    Signed: Linnell Swords [place], MDFACC  08/09/2015, 8:00 AM

## 2015-08-09 NOTE — Progress Notes (Signed)
Pt with rhythm changes, ST and Afib up to 140s. BP stable. Pt asymptomatic. EKG with changes. Dr. Tyrone SageGerhardt notified and requests cardiology consult. Dr. SwazilandJordan called, reviewed EKG and will be here to see pt.

## 2015-08-09 NOTE — Progress Notes (Signed)
Pt unable to void. pt was bladder scan and 400cc was shown. Per order urinary catheter was inserted. Urine returned noted pt tolerated well. Will continue to monitor.

## 2015-08-09 NOTE — Progress Notes (Addendum)
      [address]       [place] [id number]             [phone]      7 Days Post-Op Procedure(s) (LRB): THORACIC ASCENDING ANEURYSM REPAIR (AAA) (N/A) Subjective: Feels very tired, weak  Objective: Vital signs in last 24 hours: Temp:  [97.7 F (36.5 C)-98.4 F (36.9 C)] 98.4 F (36.9 C) (10/23 0534) Pulse Rate:  [90-102] 99 (10/23 0534) Cardiac Rhythm:  [-] Atrial fibrillation;Atrial flutter (10/23 0743) Resp:  [11-19] 18 (10/23 0534) BP: (105-136)/(60-91) 117/81 mmHg (10/23 0645) SpO2:  [96 %-100 %] 100 % (10/23 0645) Weight:  [203 lb 3.2 oz (92.171 kg)] 203 lb 3.2 oz (92.171 kg) (10/23 0534)  Hemodynamic parameters for last 24 hours:    Intake/Output from previous day: 10/22 0701 - 10/23 0700 In: 960 [P.O.:960] Out: 1750 [Urine:1750] Intake/Output this shift:    General appearance: alert, cooperative, fatigued and no distress Heart: irregularly irregular rhythm Lungs: dim in lower fields Abdomen: benign Extremities: trace edema Wound: incis healing well  Lab Results:  Recent Labs  08/07/15 0220 08/08/15 0530  WBC 13.9* 13.5*  HGB 8.6* 8.0*  HCT 26.6* 24.1*  PLT 117* 155   BMET:  Recent Labs  08/07/15 0220 08/08/15 0530  NA 140 137  K 4.3 3.7  CL 101 100*  CO2 29 25  GLUCOSE 103* 101*  BUN 23* 28*  CREATININE 0.93 0.84  CALCIUM 8.8* 9.1    PT/INR: No results for input(s): LABPROT, INR in the last 72 hours. ABG    Component Value Date/Time   PHART 7.364 08/04/2015 0958   HCO3 24.5* 08/04/2015 0958   TCO2 23 08/04/2015 1641   ACIDBASEDEF 1.0 08/04/2015 0958   O2SAT 99.0 08/04/2015 0958   CBG (last 3)   Recent Labs  08/08/15 1628 08/08/15 2131 08/09/15 0607  GLUCAP 98 89 81    Meds Scheduled Meds: . amiodarone  400 mg Oral BID  . aspirin EC  81 mg Oral Daily  . enoxaparin (LOVENOX) injection  40 mg Subcutaneous Q24H  . insulin aspart  0-24 Units Subcutaneous TID AC & HS  . mesalamine  1.5 g Oral Daily  .  metoprolol tartrate  25 mg Oral BID  . pantoprazole  40 mg Oral QAC breakfast  . predniSONE  30 mg Oral Q breakfast  . sodium chloride  3 mL Intravenous Q12H  . tamsulosin  0.4 mg Oral QPC breakfast   Continuous Infusions:  PRN Meds:.sodium chloride, bisacodyl **OR** bisacodyl, ondansetron **OR** ondansetron (ZOFRAN) IV, oxyCODONE, sodium chloride, traMADol  Xrays No results found.  Assessment/Plan: S/P Procedure(s) (LRB): THORACIC ASCENDING ANEURYSM REPAIR (AAA) (N/A)  1 afib- cardiology has been consulted and patient has been seen by Dr SwazilandJordan. EKG doubtful for STEMI but enzymes being sent by cards.  2 he was on prednisone taper prior to surgery. Currently 30 mg daily, will need taper schedule 3 BP is pretty well controlled but somewhat variable 4 no new labs , recheck in am 4 routine pulm toilet and rehab  LOS: 6 days    GOLD,WAYNE E 08/09/2015  Called 700 am with "acute STEMI" ekg result, notified cardiology to review ekg and make recommendations  Patient feels well back in sinus now I have seen and examined Alvester Morinaryl V Hosmer and agree with the above assessment  and plan.  Delight OvensEdward B Aramis Zobel MD Beeper [phone] Office [phone] 08/09/2015 12:40 PM

## 2015-08-10 ENCOUNTER — Inpatient Hospital Stay (HOSPITAL_COMMUNITY): Payer: Non-veteran care

## 2015-08-10 DIAGNOSIS — R931 Abnormal findings on diagnostic imaging of heart and coronary circulation: Secondary | ICD-10-CM | POA: Diagnosis not present

## 2015-08-10 DIAGNOSIS — I7101 Dissection of thoracic aorta: Principal | ICD-10-CM

## 2015-08-10 DIAGNOSIS — I4891 Unspecified atrial fibrillation: Secondary | ICD-10-CM | POA: Diagnosis not present

## 2015-08-10 DIAGNOSIS — I214 Non-ST elevation (NSTEMI) myocardial infarction: Secondary | ICD-10-CM | POA: Diagnosis not present

## 2015-08-10 DIAGNOSIS — I480 Paroxysmal atrial fibrillation: Secondary | ICD-10-CM | POA: Diagnosis not present

## 2015-08-10 DIAGNOSIS — I10 Essential (primary) hypertension: Secondary | ICD-10-CM

## 2015-08-10 DIAGNOSIS — I481 Persistent atrial fibrillation: Secondary | ICD-10-CM | POA: Diagnosis not present

## 2015-08-10 DIAGNOSIS — D65 Disseminated intravascular coagulation [defibrination syndrome]: Secondary | ICD-10-CM | POA: Diagnosis not present

## 2015-08-10 DIAGNOSIS — I5021 Acute systolic (congestive) heart failure: Secondary | ICD-10-CM | POA: Diagnosis not present

## 2015-08-10 DIAGNOSIS — R7989 Other specified abnormal findings of blood chemistry: Secondary | ICD-10-CM | POA: Diagnosis not present

## 2015-08-10 LAB — PROTIME-INR
INR: 1.18 (ref 0.00–1.49)
Prothrombin Time: 15.2 seconds (ref 11.6–15.2)

## 2015-08-10 LAB — CBC
HCT: 26.4 % — ABNORMAL LOW (ref 39.0–52.0)
Hemoglobin: 8.5 g/dL — ABNORMAL LOW (ref 13.0–17.0)
MCH: 29.4 pg (ref 26.0–34.0)
MCHC: 32.2 g/dL (ref 30.0–36.0)
MCV: 91.3 fL (ref 78.0–100.0)
Platelets: 247 10*3/uL (ref 150–400)
RBC: 2.89 MIL/uL — ABNORMAL LOW (ref 4.22–5.81)
RDW: 16.3 % — AB (ref 11.5–15.5)
WBC: 12 10*3/uL — ABNORMAL HIGH (ref 4.0–10.5)

## 2015-08-10 LAB — COMPREHENSIVE METABOLIC PANEL
ALBUMIN: 3 g/dL — AB (ref 3.5–5.0)
ALK PHOS: 53 U/L (ref 38–126)
ALT: 28 U/L (ref 17–63)
AST: 23 U/L (ref 15–41)
Anion gap: 8 (ref 5–15)
BILIRUBIN TOTAL: 0.8 mg/dL (ref 0.3–1.2)
BUN: 25 mg/dL — AB (ref 6–20)
CALCIUM: 9.7 mg/dL (ref 8.9–10.3)
CO2: 27 mmol/L (ref 22–32)
CREATININE: 0.86 mg/dL (ref 0.61–1.24)
Chloride: 104 mmol/L (ref 101–111)
GFR calc Af Amer: >60 mL/min (ref >60)
GFR calc non Af Amer: >60 mL/min (ref >60)
GLUCOSE: 107 mg/dL — AB (ref 65–99)
Potassium: 4.1 mmol/L (ref 3.5–5.1)
Sodium: 139 mmol/L (ref 135–145)
TOTAL PROTEIN: 5.7 g/dL — AB (ref 6.5–8.1)

## 2015-08-10 LAB — GLUCOSE, CAPILLARY
GLUCOSE-CAPILLARY: 96 mg/dL (ref 65–99)
GLUCOSE-CAPILLARY: 84 mg/dL (ref 65–99)
Glucose-Capillary: 106 mg/dL — ABNORMAL HIGH (ref 65–99)
Glucose-Capillary: 103 mg/dL — ABNORMAL HIGH (ref 65–99)

## 2015-08-10 MED ORDER — ATORVASTATIN CALCIUM 80 MG PO TABS
80.0000 mg | ORAL_TABLET | Freq: Every day | ORAL | Status: DC
  Administered 2015-08-10 – 2015-08-15 (×6): 80 mg via ORAL
  Filled 2015-08-10 (×6): qty 1

## 2015-08-10 MED ORDER — SODIUM CHLORIDE 0.9 % WEIGHT BASED INFUSION
3.0000 mL/kg/h | INTRAVENOUS | Status: DC
  Administered 2015-08-11: 3 mL/kg/h via INTRAVENOUS

## 2015-08-10 MED ORDER — SODIUM CHLORIDE 0.9 % IV SOLN: 250.0000 mL | INTRAVENOUS | Status: DC | PRN

## 2015-08-10 MED ORDER — PREDNISONE 20 MG PO TABS
20.0000 mg | ORAL_TABLET | Freq: Every day | ORAL | Status: DC
  Administered 2015-08-11 – 2015-08-16 (×6): 20 mg via ORAL
  Filled 2015-08-10 (×6): qty 1

## 2015-08-10 MED ORDER — SODIUM CHLORIDE 0.9 % IJ SOLN: 3.0000 mL | INTRAMUSCULAR | Status: DC | PRN

## 2015-08-10 MED ORDER — METOPROLOL TARTRATE 25 MG PO TABS
25.0000 mg | ORAL_TABLET | Freq: Three times a day (TID) | ORAL | Status: DC
  Administered 2015-08-10 – 2015-08-11 (×5): 25 mg via ORAL
  Filled 2015-08-10 (×5): qty 1

## 2015-08-10 MED ORDER — SODIUM CHLORIDE 0.9 % WEIGHT BASED INFUSION: 1.0000 mL/kg/h | INTRAVENOUS | Status: DC

## 2015-08-10 MED ORDER — SODIUM CHLORIDE 0.9 % IJ SOLN
3.0000 mL | Freq: Two times a day (BID) | INTRAMUSCULAR | Status: DC
  Administered 2015-08-10: 3 mL via INTRAVENOUS

## 2015-08-10 MED ORDER — ASPIRIN 81 MG PO CHEW
81.0000 mg | CHEWABLE_TABLET | ORAL | Status: AC
  Administered 2015-08-11: 81 mg via ORAL
  Filled 2015-08-10: qty 1

## 2015-08-10 NOTE — Progress Notes (Signed)
  Echocardiogram 2D Echocardiogram has been performed.  Arvil ChacoFoster, Christyanna Mckeon 08/10/2015, 10:39 AM

## 2015-08-10 NOTE — Progress Notes (Signed)
Physical Therapy Treatment Patient Details Name: Ryan Cole MRN: [id number] DOB: April 21, 1956 Today's Date: 08/10/2015    History of Present Illness pt is a 59 y.o. 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  Pt to OR for emergent TAA repair. Bleeding complications post op.   Extubated 10/18.      PT Comments    Significantly improved.  Emphasis on high level balance challenge  Follow Up Recommendations  No PT follow up;Supervision - Intermittent;Supervision for mobility/OOB     Equipment Recommendations  None recommended by PT;Other (comment)    Recommendations for Other Services       Precautions / Restrictions Precautions Precautions: Fall;Sternal Restrictions Weight Bearing Restrictions: Yes (sternal precautions)    Mobility  Bed Mobility Overal bed mobility: Modified Independent             General bed mobility comments: follows sternal precautions well.  Transfers Overall transfer level: Modified independent Equipment used: None Transfers: Sit to/from Stand Sit to Stand: Modified independent (Device/Increase time)            Ambulation/Gait Ambulation/Gait assistance: Supervision Ambulation Distance (Feet): 300 Feet Assistive device: None Gait Pattern/deviations: Step-through pattern   Gait velocity interpretation: at or above normal speed for age/gender General Gait Details: steady gait   Stairs Stairs: Yes Stairs assistance: Supervision Stair Management: One rail Right;Step to pattern;Forwards Number of Stairs: 12 General stair comments: safe with rail  Wheelchair Mobility    Modified Rankin (Stroke Patients Only)       Balance Overall balance assessment: Needs assistance Sitting-balance support: No upper extremity supported Sitting balance-Leahy Scale: Good     Standing balance support: No upper extremity supported Standing balance-Leahy Scale: Good               High level balance  activites: Backward walking;Direction changes;Turns;Sudden stops (stepping over objects) High Level Balance Comments: no overt deviation or LOB.    Cognition Arousal/Alertness: Awake/alert Behavior During Therapy: WFL for tasks assessed/performed Overall Cognitive Status: Within Functional Limits for tasks assessed                      Exercises      General Comments General comments (skin integrity, edema, etc.): EHR 100-110's, sats on RA  upper 90's%      Pertinent Vitals/Pain Pain Assessment: No/denies pain Pain Score: 5  Pain Location: chest and stomach Pain Descriptors / Indicators: Sore Pain Intervention(s): Monitored during session    Home Living Family/patient expects to be discharged to:: Private residence Living Arrangements: Spouse/significant other Available Help at Discharge: Family;Available 24 hours/day Type of Home: House       Home Equipment: Toilet riser      Prior Function Level of Independence: Independent          PT Goals (current goals can now be found in the care plan section) Acute Rehab PT Goals Patient Stated Goal: go home Time For Goal Achievement: 08/20/15 Potential to Achieve Goals: Good Progress towards PT goals: Progressing toward goals    Frequency  Min 3X/week    PT Plan Discharge plan needs to be updated    Co-evaluation             End of Session   Activity Tolerance: Patient tolerated treatment well Patient left: in chair;with call bell/phone within reach;with family/visitor present     Time: 1340-1359 PT Time Calculation (min) (ACUTE ONLY): 19 min  Charges:  $Gait Training: 8-22 mins  G Codes:      Robina Hamor, Eliseo GumKenneth V 08/10/2015, 2:16 PM 08/10/2015  [place] BingKen Caelum Federici, PT [phone]  (pager)

## 2015-08-10 NOTE — Progress Notes (Addendum)
Patient Name: Ryan Cole Date of Encounter: 08/10/2015  Active Problems:   Thoracic aortic dissection (HCC)   HTN (hypertension)   Ulcerative colitis (HCC)   Atrial fibrillation (HCC)   Length of Stay: 7  SUBJECTIVE  The patient feels better today, denies chest pain other than postsurgical sternotomy pain unchanged from prior. SOB is slightly better but persistent.   CURRENT MEDS . amiodarone  400 mg Oral BID  . aspirin EC  81 mg Oral Daily  . enoxaparin (LOVENOX) injection  40 mg Subcutaneous Q24H  . insulin aspart  0-24 Units Subcutaneous TID AC & HS  . mesalamine  1.5 g Oral Daily  . metoprolol tartrate  25 mg Oral BID  . pantoprazole  40 mg Oral QAC breakfast  . predniSONE  30 mg Oral Q breakfast  . sodium chloride  3 mL Intravenous Q12H  . tamsulosin  0.4 mg Oral QPC breakfast   OBJECTIVE  Filed Vitals:   08/09/15 2017 08/10/15 0549 08/10/15 0905 08/10/15 1100  BP: 113/61 108/70 109/71 119/87  Pulse: 110 112  111  Temp: 97.9 F (36.6 C) 98 F (36.7 C)    TempSrc: Oral Oral    Resp: 18 18    Height:      Weight:  204 lb 14.4 oz (92.942 kg)    SpO2: 100% 99%      Intake/Output Summary (Last 24 hours) at 08/10/15 1122 Last data filed at 08/10/15 0556  Gross per 24 hour  Intake    912 ml  Output    425 ml  Net    487 ml   Filed Weights   08/08/15 0500 08/09/15 0534 08/10/15 0549  Weight: 207 lb 3.7 oz (94 kg) 203 lb 3.2 oz (92.171 kg) 204 lb 14.4 oz (92.942 kg)   PHYSICAL EXAM  General: Pleasant, NAD. Neuro: Alert and oriented X 3. Moves all extremities spontaneously. Psych: Normal affect. HEENT:  Normal  Neck: Supple without bruits or JVD. Lungs:  Resp regular and unlabored, CTA. Heart: RRR no s3, s4, or murmurs. Abdomen: Soft, non-tender, non-distended, BS + x 4.  Extremities: No clubbing, cyanosis or edema. DP/PT/Radials 2+ and equal bilaterally.  Accessory Clinical Findings  CBC  Recent Labs  08/09/15 0815 08/10/15 0447  WBC 13.4*  12.0*  HGB 8.5* 8.5*  HCT 25.1* 26.4*  MCV 88.7 91.3  PLT 210 247   Basic Metabolic Panel  Recent Labs  08/09/15 0815 08/10/15 0447  NA 142 139  K 4.4 4.1  CL 107 104  CO2 18* 27  GLUCOSE 110* 107*  BUN 24* 25*  CREATININE 0.95 0.86  CALCIUM 9.5 9.7   Liver Function Tests  Recent Labs  08/10/15 0447  AST 23  ALT 28  ALKPHOS 53  BILITOT 0.8  PROT 5.7*  ALBUMIN 3.0*   No results for input(s): LIPASE, AMYLASE in the last 72 hours. Cardiac Enzymes  Recent Labs  08/09/15 0815 08/09/15 1417 08/09/15 1855  TROPONINI 1.69* 1.34* 1.06*    Recent Labs  08/09/15 1417  CHOL 140  HDL 46  LDLCALC 70  TRIG 118  CHOLHDL 3.0   Thyroid Function Tests  Recent Labs  08/09/15 1417  TSH 3.404    Radiology/Studies  Dg Chest 2 View  08/09/2015  CLINICAL DATA:  Post thoracic aortic aneurysm repair 08/02/2015 EXAM: CHEST  2 VIEW COMPARISON:  08/07/2015 FINDINGS: Enlargement of cardiac silhouette post median sternotomy. Epicardial pacing wires noted. Stable mediastinal contours and pulmonary vascularity. Bibasilar effusions  and atelectasis greater on LEFT little changed. No acute infiltrate or pneumothorax. Bones unremarkable. IMPRESSION: Persistent bibasilar at atelectasis and pleural effusions greater on LEFT, unchanged. Enlargement of cardiac silhouette.   TELE: atrial flutter with ventricular rate 110/minute   ASSESSMENT AND PLAN  1. NSTEMI, Troponin 1.69 --> 1.34 --1.06,  New LV dysfunction with regional wall abnormalities in the LAD territory, Crea normal but recent GIB, we will cath him tomorrow, we will have to carefully consider possible bare metal stent if a significant lesion is identified as he had a GIB a month ago.   2. Atrial fibrillation/flutter post op thoracic surgery. Minimally symptomatic. No Ecg criteria for STEMI. CHAD-vasc score of 3-4 with HASBLED score of 3. Anticoagulation was held as fairly recent GI bleeding from UC. Amiodarone increased to  to 400 mg bid. Increased metoprolol to 25 mg bid. Hopefully will convert to NSR. Will check TSH.   2. 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 S/p surgery as noted. 3. HTN controlled. 4. Ulcerative colitis. Consider tapering steroids to maintenance dose.  5. Remote TIA 6. Urinary retention- now on Flomax. 7. ? Hyperlipidemia. No record of lipid panel- will order.   Signed, Lars MassonNELSON, Mariadejesus Cade H MD, [facility] Psych[facility] HospitalFACC 08/10/2015

## 2015-08-10 NOTE — Evaluation (Signed)
Occupational Therapy Evaluation Patient Details Name: Ryan MorinDaryl V Cole MRN: [id number] DOB: 06/07/1956 Today's Date: 08/10/2015    History of Present Illness pt is a 59 y.o. 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  Pt to OR for emergent TAA repair. Bleeding complications post op.   Extubated 10/18.     Clinical Impression   Pt s/p above. Pt independent with ADLs, PTA. Feel pt will benefit from OT to reinforce sternal precautions and increase independence prior to d/c. Do not feel he will need follow up OT upon d/c.     Follow Up Recommendations  No OT follow up;Supervision - Intermittent    Equipment Recommendations  3 in 1 bedside comode    Recommendations for Other Services       Precautions / Restrictions Precautions Precautions: Fall;Sternal Restrictions Weight Bearing Restrictions: Yes (sternal precautions)      Mobility Bed Mobility               General bed mobility comments: not assessed  Transfers Overall transfer level: Needs assistance Equipment used: None Transfers: Sit to/from Stand Sit to Stand: Supervision              Balance    No LOB in session. Balance not formally assessed.                                        ADL Overall ADL's : Needs assistance/impaired     Grooming: Set up;Supervision/safety;Sitting               Lower Body Dressing: Set up;Supervision/safety;Sit to/from stand   Toilet Transfer: Min guard;Ambulation (sit to stand from chair)           Functional mobility during ADLs: Min guard General ADL Comments: Discussed incorporating precautions into functional activities.  Discussed options for shower chair and safety.     Vision     Perception     Praxis      Pertinent Vitals/Pain Pain Assessment: 0-10 Pain Score: 5  Pain Location: chest and stomach Pain Descriptors / Indicators: Sore Pain Intervention(s): Monitored during session     Hand  Dominance     Extremity/Trunk Assessment Upper Extremity Assessment Upper Extremity Assessment: Overall WFL for tasks assessed   Lower Extremity Assessment Lower Extremity Assessment: Defer to PT evaluation       Communication Communication Communication: No difficulties   Cognition Arousal/Alertness: Awake/alert Behavior During Therapy: WFL for tasks assessed/performed Overall Cognitive Status: Within Functional Limits for tasks assessed                     General Comments       Exercises       Shoulder Instructions      Home Living Family/patient expects to be discharged to:: Private residence Living Arrangements: Spouse/significant other Available Help at Discharge: Family;Available 24 hours/day Type of Home: House             Bathroom Shower/Tub: Chief Strategy OfficerTub/shower unit   Bathroom Toilet: Standard     Home Equipment: Toilet riser      Lives With: Spouse    Prior Functioning/Environment Level of Independence: Independent             OT Diagnosis: Acute pain   OT Problem List: Pain;Decreased knowledge of precautions;Decreased knowledge of use of DME or AE;Decreased activity tolerance   OT Treatment/Interventions:  Self-care/ADL training;DME and/or AE instruction;Therapeutic activities;Patient/family education;Balance training    OT Goals(Current goals can be found in the care plan section) Acute Rehab OT Goals Patient Stated Goal: go home OT Goal Formulation: With patient/family Time For Goal Achievement: 08/17/15 Potential to Achieve Goals: Good ADL Goals Pt Will Perform Lower Body Dressing: sit to/from stand;with supervision (including gathering items) Pt Will Perform Tub/Shower Transfer: Tub transfer;ambulating;with supervision;3 in 1 Additional ADL Goal #1: Pt will independently verbalize all sternal precautions and maintain during session.  OT Frequency: Min 2X/week   Barriers to D/C:            Co-evaluation              End  of Session Equipment Utilized During Treatment: Gait belt  Activity Tolerance: Patient tolerated treatment well Patient left: in chair;with call bell/phone within reach;with family/visitor present   Time: 1610-9604 OT Time Calculation (min): 16 min Charges:  OT General Charges $OT Visit: 1 Procedure OT Evaluation $Initial OT Evaluation Tier I: 1 Procedure G-CodesEarlie Raveling OTR/L [phone] 08/10/2015, 1:41 PM

## 2015-08-10 NOTE — Progress Notes (Signed)
CARDIAC REHAB PHASE I   PRE:  Rate/Rhythm: 111 aflutter    BP: sitting 109/79    SaO2: 97 RA  MODE:  Ambulation: 550 ft   POST:  Rate/Rhythm: 12 aflutter    BP: sitting 139/80     SaO2: 98 RA  Pt did not walk yesterday since moving to 2W. Steady without RW, min assist. Reminders given for sternal precautions. HR did not change with walking, no major c/o. Return to recliner, elevated feet due to swelling. Can walk with wife. Encouraged IS.  [phone]   Ryan Cole, Ryan Cole CES, ACSM 08/10/2015 9:27 AM

## 2015-08-10 NOTE — Progress Notes (Signed)
Utilization review completed.  

## 2015-08-10 NOTE — Progress Notes (Signed)
Chest tube site sutures removed, sites were gaping and oozing. steristrips applied.4 Pacing wires removed, pt tol. Well. Ends intact.

## 2015-08-10 NOTE — Progress Notes (Signed)
CSW spoke with pt and wife concerning plan at time of DC.  Pt and wife would like for pt to go home with home health services instead of SNF.  CSW informed RNCM  CSW signing off  Merlyn LotJenna Holoman, Connecticut[place]CSWA Clinical Social Worker [phone]

## 2015-08-10 NOTE — Progress Notes (Addendum)
       [address]       [place] [id number]             [phone]          8 Days Post-Op Procedure(s) (LRB): THORACIC ASCENDING ANEURYSM REPAIR (AAA) (N/A)  Subjective: Feels better this am. Appetite improving. Still tires easily, but getting a little stronger.   Objective: Vital signs in last 24 hours: Patient Vitals for the past 24 hrs:  BP Temp Temp src Pulse Resp SpO2 Weight  08/10/15 0549 108/70 mmHg 98 F (36.7 C) Oral (!) 112 18 99 % 204 lb 14.4 oz (92.942 kg)  08/09/15 2017 113/61 mmHg 97.9 F (36.6 C) Oral (!) 110 18 100 % -  08/09/15 1451 98/60 mmHg 98.4 F (36.9 C) Oral (!) 104 19 100 % -  08/09/15 0828 105/70 mmHg - - - - - -   Current Weight  08/10/15 204 lb 14.4 oz (92.942 kg)  PRE-OPERATIVE WEIGHT: 88 kg   Intake/Output from previous day: 10/23 0701 - 10/24 0700 In: 912 [P.O.:462] Out: 425 [Urine:425]    PHYSICAL EXAM:  Heart: RRR Lungs: Diminished BS in bases Wound: Clean and dry Extremities: + LE edema    Lab Results: CBC: Recent Labs  08/08/15 0530 08/09/15 0815  WBC 13.5* 13.4*  HGB 8.0* 8.5*  HCT 24.1* 25.1*  PLT 155 210   BMET:  Recent Labs  08/08/15 0530 08/09/15 0815  NA 137 142  K 3.7 4.4  CL 100* 107  CO2 25 18*  GLUCOSE 101* 110*  BUN 28* 24*  CREATININE 0.84 0.95  CALCIUM 9.1 9.5    PT/INR: No results for input(s): LABPROT, INR in the last 72 hours.    Assessment/Plan: S/P Procedure(s) (LRB): THORACIC ASCENDING ANEURYSM REPAIR (AAA) (N/A)  CV- AF, now ST/Aflutter. Continue Amio, Lopressor. BPs low normal but stable. Cardiology following and echo ordered.  Expected postop blood loss anemia- H/H generally stable.  Postop urinary retention- Foley replaced on 10/22, Flomax started.  Hopefully can attempt a voiding trial soon.  H/o ulcerative colitis- Continue steroids.   Vol overload- wt up and some edema on exam.  May need further diuresis, not currently on   Lasix.  Deconditioning- Continue CRPI/PT.    LOS: 7 days    COLLINS,GINA H 08/10/2015  Patient seen and examined, agree with above In flutter, rate reasonably well controlled Will decrease prednisone to 20 mg daily  Viviann SpareSteven C. Dorris FetchHendrickson, MD [facility] [phone]

## 2015-08-10 NOTE — Progress Notes (Signed)
SLP Cancellation Note  Patient Details Name: Ryan Cole MRN: [id number] DOB: 10/30/1955   Cancelled treatment:       Reason Eval/Treat Not Completed: Other (comment) Pt working with other providers, will return as able.   Maxcine HamLaura Paiewonsky, M.A. CCC-SLP [phone]  Maxcine Hamaiewonsky, Ryan Cole 08/10/2015, 9:39 AM

## 2015-08-10 NOTE — Care Management Note (Signed)
Case Management Note CM note started by Avie ArenasSarah Brown RNCM  Patient Details  Name: Ryan Cole MRN: [id number] Date of Birth: 10/07/1956  Subjective/Objective:       [facility] - left message for call back              Action/Plan:   Expected Discharge Date:                Expected Discharge Plan:  Home/Self Care  In-House Referral:     Discharge planning Services  CM Consult  Post Acute Care Choice:    Choice offered to:     DME Arranged:    DME Agency:     HH Arranged:    HH Agency:     Status of Service:  In process, will continue to follow  Medicare Important Message Given:    Date Medicare IM Given:    Medicare IM give by:    Date Additional Medicare IM Given:    Additional Medicare Important Message give by:     If discussed at Long Length of Stay Meetings, dates discussed:  08/11/15  Additional Comments:  08/10/15- Donn PieriniKristi Somara Frymire RN, BSN - spoke with pt and wife at bedside- this am- per conversation pt plans to return home with wife and family to assist- wife states that her sisters are planning on coming in to assist them upon discharge as she has to return to work however between herself and the 2 sister-n-laws pt will have 24/7 assistance upon discharge. They are interested in possible HH and DME (RW and 3n1) if needed and are awaiting PT to see pt again today. Spoke with pt and wife again later this afternoon following PT therapy and there are no f/u recommendations now being made by PT based on pt's progress- At this time pt and wife are in agreement that they will not need HH at this time for PT and do not feel like any DME will be needed. CM will continue to follow for d/c need- [facility]- and CM will assess for new medications and [facility] any new [facility] needed and [facility].   Darrold SpanWebster, Burl Tauzin Hall, RN 08/10/2015, 8:58 PM

## 2015-08-11 ENCOUNTER — Encounter (HOSPITAL_COMMUNITY)
Admission: EM | Disposition: A | Discharge status: Home / Self Care | Payer: Self-pay | Source: Home / Self Care | Attending: Thoracic Surgery (Cardiothoracic Vascular Surgery)

## 2015-08-11 DIAGNOSIS — D65 Disseminated intravascular coagulation [defibrination syndrome]: Secondary | ICD-10-CM | POA: Diagnosis not present

## 2015-08-11 DIAGNOSIS — R931 Abnormal findings on diagnostic imaging of heart and coronary circulation: Secondary | ICD-10-CM | POA: Diagnosis not present

## 2015-08-11 DIAGNOSIS — I214 Non-ST elevation (NSTEMI) myocardial infarction: Secondary | ICD-10-CM | POA: Diagnosis not present

## 2015-08-11 DIAGNOSIS — I7101 Dissection of thoracic aorta: Secondary | ICD-10-CM | POA: Diagnosis not present

## 2015-08-11 DIAGNOSIS — R7989 Other specified abnormal findings of blood chemistry: Secondary | ICD-10-CM | POA: Diagnosis not present

## 2015-08-11 DIAGNOSIS — R778 Other specified abnormalities of plasma proteins: Secondary | ICD-10-CM | POA: Insufficient documentation

## 2015-08-11 DIAGNOSIS — I5021 Acute systolic (congestive) heart failure: Secondary | ICD-10-CM | POA: Diagnosis not present

## 2015-08-11 DIAGNOSIS — I480 Paroxysmal atrial fibrillation: Secondary | ICD-10-CM

## 2015-08-11 HISTORY — PX: CARDIAC CATHETERIZATION: SHX172

## 2015-08-11 LAB — GLUCOSE, CAPILLARY
GLUCOSE-CAPILLARY: 98 mg/dL (ref 65–99)
Glucose-Capillary: 95 mg/dL (ref 65–99)
Glucose-Capillary: 94 mg/dL (ref 65–99)
Glucose-Capillary: 91 mg/dL (ref 65–99)

## 2015-08-11 LAB — CBC
HEMATOCRIT: 27.1 % — AB (ref 39.0–52.0)
HEMOGLOBIN: 8.5 g/dL — AB (ref 13.0–17.0)
MCH: 28.9 pg (ref 26.0–34.0)
MCHC: 31.4 g/dL (ref 30.0–36.0)
MCV: 92.2 fL (ref 78.0–100.0)
Platelets: 297 10*3/uL (ref 150–400)
RBC: 2.94 MIL/uL — AB (ref 4.22–5.81)
RDW: 16.1 % — AB (ref 11.5–15.5)
WBC: 12.3 10*3/uL — AB (ref 4.0–10.5)

## 2015-08-11 SURGERY — LEFT HEART CATH AND CORONARY ANGIOGRAPHY

## 2015-08-11 MED ORDER — HEPARIN SODIUM (PORCINE) 1000 UNIT/ML IJ SOLN
INTRAMUSCULAR | Status: AC
  Filled 2015-08-11: qty 1

## 2015-08-11 MED ORDER — HEPARIN (PORCINE) IN NACL 2-0.9 UNIT/ML-% IJ SOLN
INTRAMUSCULAR | Status: AC
  Filled 2015-08-11: qty 500

## 2015-08-11 MED ORDER — MORPHINE SULFATE (PF) 2 MG/ML IV SOLN
2.0000 mg | INTRAVENOUS | Status: DC | PRN
  Administered 2015-08-11: 2 mg via INTRAVENOUS
  Filled 2015-08-11: qty 1

## 2015-08-11 MED ORDER — FENTANYL CITRATE (PF) 100 MCG/2ML IJ SOLN
INTRAMUSCULAR | Status: DC | PRN
  Administered 2015-08-11: 50 ug via INTRAVENOUS

## 2015-08-11 MED ORDER — LIDOCAINE HCL (PF) 1 % IJ SOLN
INTRAMUSCULAR | Status: AC
  Filled 2015-08-11: qty 30

## 2015-08-11 MED ORDER — DIPHENHYDRAMINE HCL 25 MG PO CAPS
25.0000 mg | ORAL_CAPSULE | Freq: Every evening | ORAL | Status: DC | PRN
  Administered 2015-08-12: 25 mg via ORAL
  Filled 2015-08-11: qty 1

## 2015-08-11 MED ORDER — ACETAMINOPHEN 325 MG PO TABS
650.0000 mg | ORAL_TABLET | ORAL | Status: DC | PRN
  Administered 2015-08-13: 650 mg via ORAL
  Filled 2015-08-11 (×2): qty 2

## 2015-08-11 MED ORDER — HEPARIN SODIUM (PORCINE) 1000 UNIT/ML IJ SOLN
INTRAMUSCULAR | Status: DC | PRN
  Administered 2015-08-11: 4500 [IU] via INTRAVENOUS

## 2015-08-11 MED ORDER — SODIUM CHLORIDE 0.9 % WEIGHT BASED INFUSION: 3.0000 mL/kg/h | INTRAVENOUS | Status: DC

## 2015-08-11 MED ORDER — POTASSIUM CHLORIDE CRYS ER 20 MEQ PO TBCR
20.0000 meq | EXTENDED_RELEASE_TABLET | Freq: Every day | ORAL | Status: DC
  Administered 2015-08-11 – 2015-08-16 (×6): 20 meq via ORAL
  Filled 2015-08-11 (×6): qty 1

## 2015-08-11 MED ORDER — ONDANSETRON HCL 4 MG/2ML IJ SOLN: 4.0000 mg | Freq: Four times a day (QID) | INTRAMUSCULAR | Status: DC | PRN

## 2015-08-11 MED ORDER — HEPARIN (PORCINE) IN NACL 2-0.9 UNIT/ML-% IJ SOLN
INTRAMUSCULAR | Status: DC | PRN
  Administered 2015-08-11: 14:00:00

## 2015-08-11 MED ORDER — VERAPAMIL HCL 2.5 MG/ML IV SOLN
INTRAVENOUS | Status: AC
  Filled 2015-08-11: qty 2

## 2015-08-11 MED ORDER — SODIUM CHLORIDE 0.9 % IJ SOLN: 3.0000 mL | INTRAMUSCULAR | Status: DC | PRN

## 2015-08-11 MED ORDER — HEPARIN (PORCINE) IN NACL 2-0.9 UNIT/ML-% IJ SOLN
INTRAMUSCULAR | Status: DC | PRN
  Administered 2015-08-11: 14:00:00 via INTRA_ARTERIAL

## 2015-08-11 MED ORDER — SODIUM CHLORIDE 0.9 % IV SOLN: 250.0000 mL | INTRAVENOUS | Status: DC | PRN

## 2015-08-11 MED ORDER — FENTANYL CITRATE (PF) 100 MCG/2ML IJ SOLN
INTRAMUSCULAR | Status: AC
  Filled 2015-08-11: qty 4

## 2015-08-11 MED ORDER — MIDAZOLAM HCL 2 MG/2ML IJ SOLN
INTRAMUSCULAR | Status: AC
  Filled 2015-08-11: qty 4

## 2015-08-11 MED ORDER — MIDAZOLAM HCL 2 MG/2ML IJ SOLN
INTRAMUSCULAR | Status: DC | PRN
  Administered 2015-08-11: 2 mg via INTRAVENOUS

## 2015-08-11 MED ORDER — SODIUM CHLORIDE 0.9 % IJ SOLN: 3.0000 mL | Freq: Two times a day (BID) | INTRAMUSCULAR | Status: DC

## 2015-08-11 MED ORDER — FUROSEMIDE 40 MG PO TABS
40.0000 mg | ORAL_TABLET | Freq: Every day | ORAL | Status: DC
Start: 2015-08-11 — End: 2015-08-12
  Administered 2015-08-11: 40 mg via ORAL
  Filled 2015-08-11: qty 1

## 2015-08-11 MED ORDER — SODIUM CHLORIDE 0.9 % WEIGHT BASED INFUSION
1.0000 mL/kg/h | INTRAVENOUS | Status: AC
  Administered 2015-08-11: 1 mL/kg/h via INTRAVENOUS

## 2015-08-11 MED ORDER — SODIUM CHLORIDE 0.9 % WEIGHT BASED INFUSION: 1.0000 mL/kg/h | INTRAVENOUS | Status: DC

## 2015-08-11 MED ORDER — NITROGLYCERIN 0.4 MG SL SUBL
SUBLINGUAL_TABLET | SUBLINGUAL | Status: AC
  Administered 2015-08-11: 0.4 mg
  Filled 2015-08-11: qty 1

## 2015-08-11 MED ORDER — LIDOCAINE HCL (PF) 1 % IJ SOLN
INTRAMUSCULAR | Status: DC | PRN
  Administered 2015-08-11: 2 mL

## 2015-08-11 MED ORDER — IOHEXOL 350 MG/ML SOLN
INTRAVENOUS | Status: DC | PRN
  Administered 2015-08-11: 75 mL via INTRAVENOUS

## 2015-08-11 MED ORDER — NITROGLYCERIN 0.4 MG SL SUBL: 0.4000 mg | SUBLINGUAL_TABLET | SUBLINGUAL | Status: DC | PRN

## 2015-08-11 SURGICAL SUPPLY — 12 items
CATH INFINITI 5 FR JL3.5 (CATHETERS) ×3 IMPLANT
CATH INFINITI 5FR ANG PIGTAIL (CATHETERS) ×3 IMPLANT
CATH INFINITI 5FR JL4 (CATHETERS) ×2 IMPLANT
CATH INFINITI JR4 5F (CATHETERS) ×3 IMPLANT
DEVICE RAD COMP TR BAND LRG (VASCULAR PRODUCTS) ×3 IMPLANT
GLIDESHEATH SLEND SS 6F .021 (SHEATH) ×3 IMPLANT
KIT HEART LEFT (KITS) ×3 IMPLANT
PACK CARDIAC CATHETERIZATION (CUSTOM PROCEDURE TRAY) ×3 IMPLANT
SYR MEDRAD MARK V 150ML (SYRINGE) ×3 IMPLANT
TRANSDUCER W/STOPCOCK (MISCELLANEOUS) ×3 IMPLANT
TUBING CIL FLEX 10 FLL-RA (TUBING) ×3 IMPLANT
WIRE SAFE-T 1.5MM-J .035X260CM (WIRE) ×3 IMPLANT

## 2015-08-11 NOTE — Progress Notes (Signed)
Held ambulation today due to cath. Will f/u tomorrow. Ethelda ChickKristan Deni Berti CES, ACSM 2:54 PM  08/11/2015

## 2015-08-11 NOTE — Interval H&P Note (Signed)
Cath Lab Visit (complete for each Cath Lab visit)  Clinical Evaluation Leading to the Procedure:   ACS: Yes.    Non-ACS:    Anginal Classification: CCS IV  Anti-ischemic medical therapy: Minimal Therapy (1 class of medications)  Non-Invasive Test Results: No non-invasive testing performed  Prior CABG: No previous CABG      History and Physical Interval Note:  08/11/2015 1:36 PM  Ryan Cole  has presented today for surgery, with the diagnosis of cp  The various methods of treatment have been discussed with the patient and family. After consideration of risks, benefits and other options for treatment, the patient has consented to  Procedure(Cole): Left Heart Cath and Coronary Angiography (N/A) as a surgical intervention .  The patient'Cole history has been reviewed, patient examined, no change in status, stable for surgery.  I have reviewed the patient'Cole chart and labs.  Questions were answered to the patient'Cole satisfaction.     Ryan Cole.

## 2015-08-11 NOTE — Progress Notes (Signed)
SLP Cancellation Note  Patient Details Name: Ryan MorinDaryl V Cole MRN: [id number] DOB: 01/21/1956   Cancelled treatment:        Pt had cath today. Will continue efforts.   Royce MacadamiaLitaker, Jeymi Hepp Willis 08/11/2015, 3:12 PM  Breck CoonsLisa Willis Lonell FaceLitaker M.Ed ITT IndustriesCCC-SLP Pager [phone]

## 2015-08-11 NOTE — H&P (View-Only) (Signed)
Patient Name: Ryan MorinDaryl V Cole Date of Encounter: 08/11/2015  Active Problems:   Thoracic aortic dissection (HCC)   HTN (hypertension)   Ulcerative colitis (HCC)   Atrial fibrillation (HCC)   Length of Stay: 8  SUBJECTIVE  The patient feels better today, denies chest pain other than postsurgical sternotomy pain unchanged from prior. SOB is slightly better but persistent, especially when walking. Remains in atrial flutter with ventricular rate 112/minute.  CURRENT MEDS . amiodarone  400 mg Oral BID  . aspirin EC  81 mg Oral Daily  . atorvastatin  80 mg Oral q1800  . enoxaparin (LOVENOX) injection  40 mg Subcutaneous Q24H  . furosemide  40 mg Oral Daily  . insulin aspart  0-24 Units Subcutaneous TID AC & HS  . mesalamine  1.5 g Oral Daily  . metoprolol tartrate  25 mg Oral TID  . pantoprazole  40 mg Oral QAC breakfast  . potassium chloride  20 mEq Oral Daily  . predniSONE  20 mg Oral Q breakfast  . sodium chloride  3 mL Intravenous Q12H  . sodium chloride  3 mL Intravenous Q12H  . tamsulosin  0.4 mg Oral QPC breakfast   OBJECTIVE  Filed Vitals:   08/10/15 2110 08/11/15 0518 08/11/15 0848 08/11/15 0859  BP: 117/73 108/75 109/75 119/69  Pulse: 111 110 111 112  Temp: 98.4 F (36.9 C) 98.2 F (36.8 C)    TempSrc: Oral Oral    Resp: 18 18  18   Height:      Weight:  206 lb 12.7 oz (93.8 kg)    SpO2: 100% 100%      Intake/Output Summary (Last 24 hours) at 08/11/15 1056 Last data filed at 08/11/15 0829  Gross per 24 hour  Intake    480 ml  Output    800 ml  Net   -320 ml   Filed Weights   08/09/15 0534 08/10/15 0549 08/11/15 0518  Weight: 203 lb 3.2 oz (92.171 kg) 204 lb 14.4 oz (92.942 kg) 206 lb 12.7 oz (93.8 kg)   PHYSICAL EXAM  General: Pleasant, NAD. Neuro: Alert and oriented X 3. Moves all extremities spontaneously. Psych: Normal affect. HEENT:  Normal  Neck: Supple without bruits or JVD. Lungs:  Resp regular and unlabored, CTA. Heart: RRR no s3, s4, or  murmurs. Abdomen: Soft, non-tender, non-distended, BS + x 4.  Extremities: No clubbing, cyanosis or edema. DP/PT/Radials 2+ and equal bilaterally.  Accessory Clinical Findings  CBC  Recent Labs  08/10/15 0447 08/11/15 0356  WBC 12.0* 12.3*  HGB 8.5* 8.5*  HCT 26.4* 27.1*  MCV 91.3 92.2  PLT 247 297   Basic Metabolic Panel  Recent Labs  08/09/15 0815 08/10/15 0447  NA 142 139  K 4.4 4.1  CL 107 104  CO2 18* 27  GLUCOSE 110* 107*  BUN 24* 25*  CREATININE 0.95 0.86  CALCIUM 9.5 9.7   Liver Function Tests  Recent Labs  08/10/15 0447  AST 23  ALT 28  ALKPHOS 53  BILITOT 0.8  PROT 5.7*  ALBUMIN 3.0*    Recent Labs  08/09/15 0815 08/09/15 1417 08/09/15 1855  TROPONINI 1.69* 1.34* 1.06*    Recent Labs  08/09/15 1417  CHOL 140  HDL 46  LDLCALC 70  TRIG 118  CHOLHDL 3.0   Thyroid Function Tests  Recent Labs  08/09/15 1417  TSH 3.404    Radiology/Studies  Dg Chest 2 View  08/09/2015  CLINICAL DATA:  Post thoracic aortic aneurysm  repair 08/02/2015 EXAM: CHEST  2 VIEW COMPARISON:  08/07/2015 FINDINGS: Enlargement of cardiac silhouette post median sternotomy. Epicardial pacing wires noted. Stable mediastinal contours and pulmonary vascularity. Bibasilar effusions and atelectasis greater on LEFT little changed. No acute infiltrate or pneumothorax. Bones unremarkable. IMPRESSION: Persistent bibasilar at atelectasis and pleural effusions greater on LEFT, unchanged. Enlargement of cardiac silhouette.   TELE: atrial flutter with ventricular rate 110/minute   ASSESSMENT AND PLAN  1. NSTEMI, Troponin 1.69 --> 1.34 --1.06,  New LV dysfunction with regional wall abnormalities in the LAD territory, Crea normal but recent GIB, we will cath today, we will have to carefully consider possible bare metal stent if a significant lesion is identified as he had a GIB a month ago.   2. Atrial fibrillation/flutter post op thoracic surgery. Minimally symptomatic. No  Ecg criteria for STEMI. CHAD-vasc score of 3-4 with HASBLED score of 3. Anticoagulation was held as fairly recent GI bleeding from UC. Amiodarone increased to to 400 mg bid. Increased metoprolol to 25 mg bid. Hopefully will convert to NSR. Will check TSH.   2. 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 S/p surgery as noted. 3. HTN controlled. 4. Ulcerative colitis. Consider tapering steroids to maintenance dose.  5. Remote TIA 6. Urinary retention- now on Flomax. 7. ? Hyperlipidemia. No record of lipid panel- will order.   Signed, Jakala Herford H MD, FACC 08/11/2015   

## 2015-08-11 NOTE — Progress Notes (Addendum)
  Patient reported having an aching, constant chest pain which was along his left shoulder and left side of his neck starting after his catheterization this afternoon. He had been given 10mg  Oxycodone at 1457 and 2 SL NTG. He reported improvement in his pain with the Oxycodone from a 9/10 to 7/10 and no change in his pain with SL NTG administration.  Dr. Delton SeeNelson was notified of the patient's current condition and agreed to obtain an EKG and administer Morphine.  An EKG was obtained which showed atrial flutter with rate in 110's, with no acute changes since the previous tracing. An order was added for Morphine 2mg  PRN Q2H. Will continue to monitor patient's symptoms and response to morphine.  Signed, Ellsworth LennoxBrittany M Zayven Powe, PA-C 08/11/2015, 3:50 PM Pager: [phone]

## 2015-08-11 NOTE — Progress Notes (Signed)
OT Cancellation Note  Patient Details Name: Alvester MorinDaryl V Sievers MRN: [id number] DOB: 08/09/1956   Cancelled Treatment:    Reason Eval/Treat Not Completed: Medical issues which prohibited therapy;Other (comment) (Pt with recent cardiac cath will check back tomorrow to proceed with therapy.)  William Laske OTR/L 08/11/2015, 3:34 PM

## 2015-08-11 NOTE — Progress Notes (Addendum)
Patient Name: Alvester MorinDaryl V Digioia Date of Encounter: 08/11/2015  Active Problems:   Thoracic aortic dissection (HCC)   HTN (hypertension)   Ulcerative colitis (HCC)   Atrial fibrillation (HCC)   Length of Stay: 8  SUBJECTIVE  The patient feels better today, denies chest pain other than postsurgical sternotomy pain unchanged from prior. SOB is slightly better but persistent, especially when walking. Remains in atrial flutter with ventricular rate 112/minute.  CURRENT MEDS . amiodarone  400 mg Oral BID  . aspirin EC  81 mg Oral Daily  . atorvastatin  80 mg Oral q1800  . enoxaparin (LOVENOX) injection  40 mg Subcutaneous Q24H  . furosemide  40 mg Oral Daily  . insulin aspart  0-24 Units Subcutaneous TID AC & HS  . mesalamine  1.5 g Oral Daily  . metoprolol tartrate  25 mg Oral TID  . pantoprazole  40 mg Oral QAC breakfast  . potassium chloride  20 mEq Oral Daily  . predniSONE  20 mg Oral Q breakfast  . sodium chloride  3 mL Intravenous Q12H  . sodium chloride  3 mL Intravenous Q12H  . tamsulosin  0.4 mg Oral QPC breakfast   OBJECTIVE  Filed Vitals:   08/10/15 2110 08/11/15 0518 08/11/15 0848 08/11/15 0859  BP: 117/73 108/75 109/75 119/69  Pulse: 111 110 111 112  Temp: 98.4 F (36.9 C) 98.2 F (36.8 C)    TempSrc: Oral Oral    Resp: 18 18  18   Height:      Weight:  206 lb 12.7 oz (93.8 kg)    SpO2: 100% 100%      Intake/Output Summary (Last 24 hours) at 08/11/15 1056 Last data filed at 08/11/15 0829  Gross per 24 hour  Intake    480 ml  Output    800 ml  Net   -320 ml   Filed Weights   08/09/15 0534 08/10/15 0549 08/11/15 0518  Weight: 203 lb 3.2 oz (92.171 kg) 204 lb 14.4 oz (92.942 kg) 206 lb 12.7 oz (93.8 kg)   PHYSICAL EXAM  General: Pleasant, NAD. Neuro: Alert and oriented X 3. Moves all extremities spontaneously. Psych: Normal affect. HEENT:  Normal  Neck: Supple without bruits or JVD. Lungs:  Resp regular and unlabored, CTA. Heart: RRR no s3, s4, or  murmurs. Abdomen: Soft, non-tender, non-distended, BS + x 4.  Extremities: No clubbing, cyanosis or edema. DP/PT/Radials 2+ and equal bilaterally.  Accessory Clinical Findings  CBC  Recent Labs  08/10/15 0447 08/11/15 0356  WBC 12.0* 12.3*  HGB 8.5* 8.5*  HCT 26.4* 27.1*  MCV 91.3 92.2  PLT 247 297   Basic Metabolic Panel  Recent Labs  08/09/15 0815 08/10/15 0447  NA 142 139  K 4.4 4.1  CL 107 104  CO2 18* 27  GLUCOSE 110* 107*  BUN 24* 25*  CREATININE 0.95 0.86  CALCIUM 9.5 9.7   Liver Function Tests  Recent Labs  08/10/15 0447  AST 23  ALT 28  ALKPHOS 53  BILITOT 0.8  PROT 5.7*  ALBUMIN 3.0*    Recent Labs  08/09/15 0815 08/09/15 1417 08/09/15 1855  TROPONINI 1.69* 1.34* 1.06*    Recent Labs  08/09/15 1417  CHOL 140  HDL 46  LDLCALC 70  TRIG 118  CHOLHDL 3.0   Thyroid Function Tests  Recent Labs  08/09/15 1417  TSH 3.404    Radiology/Studies  Dg Chest 2 View  08/09/2015  CLINICAL DATA:  Post thoracic aortic aneurysm  repair 08/02/2015 EXAM: CHEST  2 VIEW COMPARISON:  08/07/2015 FINDINGS: Enlargement of cardiac silhouette post median sternotomy. Epicardial pacing wires noted. Stable mediastinal contours and pulmonary vascularity. Bibasilar effusions and atelectasis greater on LEFT little changed. No acute infiltrate or pneumothorax. Bones unremarkable. IMPRESSION: Persistent bibasilar at atelectasis and pleural effusions greater on LEFT, unchanged. Enlargement of cardiac silhouette.   TELE: atrial flutter with ventricular rate 110/minute   ASSESSMENT AND PLAN  1. NSTEMI, Troponin 1.69 --> 1.34 --1.06,  New LV dysfunction with regional wall abnormalities in the LAD territory, Crea normal but recent GIB, we will cath today, we will have to carefully consider possible bare metal stent if a significant lesion is identified as he had a GIB a month ago.   2. Atrial fibrillation/flutter post op thoracic surgery. Minimally symptomatic. No  Ecg criteria for STEMI. CHAD-vasc score of 3-4 with HASBLED score of 3. Anticoagulation was held as fairly recent GI bleeding from UC. Amiodarone increased to to 400 mg bid. Increased metoprolol to 25 mg bid. Hopefully will convert to NSR. Will check TSH.   2. 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 S/p surgery as noted. 3. HTN controlled. 4. Ulcerative colitis. Consider tapering steroids to maintenance dose.  5. Remote TIA 6. Urinary retention- now on Flomax. 7. ? Hyperlipidemia. No record of lipid panel- will order.   Signed, Lars Masson MD, [facility] 08/11/2015

## 2015-08-11 NOTE — Progress Notes (Addendum)
[address]       [place] [id number]             [phone]          9 Days Post-Op Procedure(s) (LRB): THORACIC ASCENDING ANEURYSM REPAIR (AAA) (N/A)  Subjective: Up and about in room. No pain, but gets mildly dyspneic with exertion.   Objective: Vital signs in last 24 hours: Patient Vitals for the past 24 hrs:  BP Temp Temp src Pulse Resp SpO2 Weight  08/11/15 0518 108/75 mmHg 98.2 F (36.8 C) Oral (!) 110 18 100 % 206 lb 12.7 oz (93.8 kg)  08/10/15 2110 117/73 mmHg 98.4 F (36.9 C) Oral (!) 111 18 100 % -  08/10/15 1315 (!) 126/91 mmHg - - (!) 111 - - -  08/10/15 1300 116/88 mmHg - - (!) 111 - - -  08/10/15 1238 120/89 mmHg - - (!) 112 - - -  08/10/15 1215 115/77 mmHg - - (!) 111 - - -  08/10/15 1200 125/81 mmHg - - (!) 111 - - -  08/10/15 1145 107/83 mmHg - - (!) 111 - - -  08/10/15 1135 110/81 mmHg - - (!) 111 - - -  08/10/15 1100 119/87 mmHg - - (!) 111 - - -  08/10/15 0905 109/71 mmHg - - - - - -   Current Weight  08/11/15 206 lb 12.7 oz (93.8 kg)  PRE-OPERATIVE WEIGHT: 88 kg   Intake/Output from previous day: 10/24 0701 - 10/25 0700 In: 720 [P.O.:720] Out: 800 [Urine:800]    PHYSICAL EXAM:  Heart: RRR Lungs: Clear Wound: Clean and dry Extremities: +LE edema    Lab Results: CBC: Recent Labs  08/10/15 0447 08/11/15 0356  WBC 12.0* 12.3*  HGB 8.5* 8.5*  HCT 26.4* 27.1*  PLT 247 297   BMET:  Recent Labs  08/09/15 0815 08/10/15 0447  NA 142 139  K 4.4 4.1  CL 107 104  CO2 18* 27  GLUCOSE 110* 107*  BUN 24* 25*  CREATININE 0.95 0.86  CALCIUM 9.5 9.7    PT/INR:  Recent Labs  08/10/15 1908  LABPROT 15.2  INR 1.18   Lab Results  Component Value Date   TSH 3.404 08/09/2015     Echo: Study Conclusions  - Left ventricle: The cavity size was normal. There was moderate concentric hypertrophy. Systolic function was moderately to severely reduced. The estimated ejection fraction was 35%.  There is akinesis of the entire anterior, basal and mid anteroseptal, inferoseptal and apical septal walls. The study was not technically sufficient to allow evaluation of LV diastolic dysfunction due to atrial flutter. - Ventricular septum: Septal motion showed paradox. - Aortic valve: Trileaflet; normal thickness leaflets. There was mild regurgitation. - Aortic root: The aortic root was normal in size. - Ascending aorta: S/P ascending aneurysm repair, normal size. - Mitral valve: Structurally normal valve. - Left atrium: The atrium was mildly dilated. - Right ventricle: Systolic function was mildly reduced. - Right atrium: The atrium was mildly dilated. - Tricuspid valve: There was mild regurgitation. - Pulmonic valve: There was trivial regurgitation. - Pericardium, extracardiac: A small circumferential pericardial effusion was identified with no signs of tamponade.  Impressions:  - Compared to the prior study on 08/04/2015 there are now new wall motion abnormalities in the LAD territory and impaired LVEF now 35%, previoulsy 50-55%.    Assessment/Plan: S/P Procedure(s) (LRB): THORACIC ASCENDING ANEURYSM REPAIR (AAA) (N/A)  CV- AF, now mostly ST  vs flutter.  Rates remain in low 100s. Continue Amio, Lopressor. BPs stable. Echo showed decreased EF with new regional wall motion abnormalities in the LAD territory. For cath today to evaluate coronary anatomy.   Expected postop blood loss anemia- H/H generally stable.  Postop urinary retention- Foley replaced on 10/22, Flomax started. Hopefully can attempt a voiding trial soon.  H/o ulcerative colitis- Steroid dose decreased yesterday, will continue taper.   Vol overload- wt continues to go up and some edema on exam. May need diuresis, not currently on Lasix.  Deconditioning- Continue CRPI/PT.   LOS: 8 days    COLLINS,GINA H 08/11/2015  Patient seen and examined, agree with above For cath today Dc  Foley post cath  [place] C. Dorris Fetch, MD [facility] [phone]

## 2015-08-11 NOTE — Progress Notes (Signed)
Pt. Started having chest pain #8 on pain scale radiating to left shoulder. EKG done and nitro 0.4mg  given at 1455 and 1505, pt stated did get some relief. At 1500 medicated with with oxycodone 10mg  po 1515 pain level #5. MD notified. EKG and Morphine 2mg  given @ 1552. Pt stated good pain relief from morphine. Pt smiling and talking to sister in law. Will continue to monitor.

## 2015-08-12 ENCOUNTER — Encounter (HOSPITAL_COMMUNITY): Payer: Self-pay | Admitting: Interventional Cardiology

## 2015-08-12 DIAGNOSIS — I7101 Dissection of thoracic aorta: Secondary | ICD-10-CM | POA: Diagnosis not present

## 2015-08-12 DIAGNOSIS — R7989 Other specified abnormal findings of blood chemistry: Secondary | ICD-10-CM

## 2015-08-12 DIAGNOSIS — I481 Persistent atrial fibrillation: Secondary | ICD-10-CM | POA: Diagnosis not present

## 2015-08-12 DIAGNOSIS — R931 Abnormal findings on diagnostic imaging of heart and coronary circulation: Secondary | ICD-10-CM

## 2015-08-12 LAB — BASIC METABOLIC PANEL
ANION GAP: 13 (ref 5–15)
BUN: 19 mg/dL (ref 6–20)
CALCIUM: 9.4 mg/dL (ref 8.9–10.3)
CO2: 24 mmol/L (ref 22–32)
CREATININE: 1.05 mg/dL (ref 0.61–1.24)
Chloride: 102 mmol/L (ref 101–111)
GLUCOSE: 102 mg/dL — AB (ref 65–99)
Potassium: 4.3 mmol/L (ref 3.5–5.1)
Sodium: 139 mmol/L (ref 135–145)

## 2015-08-12 LAB — GLUCOSE, CAPILLARY: GLUCOSE-CAPILLARY: 99 mg/dL (ref 65–99)

## 2015-08-12 MED ORDER — FUROSEMIDE 10 MG/ML IJ SOLN
40.0000 mg | Freq: Once | INTRAMUSCULAR | Status: AC
  Administered 2015-08-12: 40 mg via INTRAVENOUS
  Filled 2015-08-12: qty 4

## 2015-08-12 MED ORDER — SODIUM CHLORIDE 0.9 % IJ SOLN: 3.0000 mL | INTRAMUSCULAR | Status: DC | PRN

## 2015-08-12 MED ORDER — METOPROLOL TARTRATE 50 MG PO TABS
50.0000 mg | ORAL_TABLET | Freq: Two times a day (BID) | ORAL | Status: DC
  Administered 2015-08-12 (×2): 50 mg via ORAL
  Filled 2015-08-12 (×4): qty 1

## 2015-08-12 MED ORDER — FUROSEMIDE 40 MG PO TABS: 40.0000 mg | ORAL_TABLET | Freq: Two times a day (BID) | ORAL | Status: DC

## 2015-08-12 MED ORDER — FUROSEMIDE 10 MG/ML IJ SOLN
40.0000 mg | Freq: Two times a day (BID) | INTRAMUSCULAR | Status: DC
  Administered 2015-08-13 – 2015-08-15 (×5): 40 mg via INTRAVENOUS
  Filled 2015-08-12 (×4): qty 4

## 2015-08-12 MED ORDER — APIXABAN 5 MG PO TABS
5.0000 mg | ORAL_TABLET | Freq: Two times a day (BID) | ORAL | Status: DC
  Administered 2015-08-12 – 2015-08-16 (×9): 5 mg via ORAL
  Filled 2015-08-12 (×9): qty 1

## 2015-08-12 MED ORDER — LISINOPRIL 2.5 MG PO TABS
2.5000 mg | ORAL_TABLET | Freq: Every day | ORAL | Status: DC
Start: 2015-08-12 — End: 2015-08-15
  Administered 2015-08-12: 2.5 mg via ORAL
  Filled 2015-08-12: qty 1

## 2015-08-12 MED ORDER — FUROSEMIDE 10 MG/ML IJ SOLN: 40.0000 mg | Freq: Two times a day (BID) | INTRAMUSCULAR | Status: DC

## 2015-08-12 NOTE — Progress Notes (Signed)
SLP Cancellation Note  Patient Details Name: Ryan Cole MRN: [id number] DOB: 06/09/1956   Cancelled treatment:        Attempted to see pt 2 separate times, in bathroom both times. Will attempt tomorrow. Per notes and speaking with PT appears pt's overt cognitive-linguistic deficits have resolved. Continue to re-assess for higher level difficulties.    Royce MacadamiaLitaker, Mario Coronado Willis 08/12/2015, 2:25 PM

## 2015-08-12 NOTE — Progress Notes (Signed)
Patient Name: Ryan MorinDaryl V Lapiana Date of Encounter: 08/12/2015     Active Problems:   Thoracic aortic dissection (HCC)   HTN (hypertension)   Ulcerative colitis (HCC)   Atrial fibrillation (HCC)   Abnormal echocardiogram   Elevated troponin    SUBJECTIVE  Some mild SOB worse when supine. + LE edema. No more chest pain.  CURRENT MEDS . amiodarone  400 mg Oral BID  . apixaban  5 mg Oral BID  . aspirin EC  81 mg Oral Daily  . atorvastatin  80 mg Oral q1800  . furosemide  40 mg Oral BID  . mesalamine  1.5 g Oral Daily  . metoprolol tartrate  50 mg Oral BID  . pantoprazole  40 mg Oral QAC breakfast  . potassium chloride  20 mEq Oral Daily  . predniSONE  20 mg Oral Q breakfast  . tamsulosin  0.4 mg Oral QPC breakfast    OBJECTIVE  Filed Vitals:   08/11/15 1712 08/11/15 2044 08/12/15 0454 08/12/15 1043  BP: 129/78 103/69 123/81 125/84  Pulse: 114 111 107 110  Temp:  98.6 F (37 C) 98 F (36.7 C)   TempSrc:  Oral Oral   Resp:   18   Height:      Weight:   208 lb 15.9 oz (94.8 kg)   SpO2:  100% 100%     Intake/Output Summary (Last 24 hours) at 08/12/15 1145 Last data filed at 08/12/15 0243  Gross per 24 hour  Intake    240 ml  Output   1150 ml  Net   -910 ml   Filed Weights   08/10/15 0549 08/11/15 0518 08/12/15 0454  Weight: 204 lb 14.4 oz (92.942 kg) 206 lb 12.7 oz (93.8 kg) 208 lb 15.9 oz (94.8 kg)    PHYSICAL EXAM  General: Pleasant, NAD. Neuro: Alert and oriented X 3. Moves all extremities spontaneously. Psych: Normal affect. HEENT:  Normal  Neck: Supple without bruits or JVD. Lungs:  Resp regular and unlabored, CTA. Heart: RRR no s3, s4, or murmurs. Abdomen: Soft, non-tender, non-distended, BS + x 4.  Extremities: No clubbing, cyanosis or edema. DP/PT/Radials 2+ and equal bilaterally. 2+ pitting edema bilaterlly  Accessory Clinical Findings  CBC  Recent Labs  08/10/15 0447 08/11/15 0356  WBC 12.0* 12.3*  HGB 8.5* 8.5*  HCT 26.4* 27.1*  MCV  91.3 92.2  PLT 247 297   Basic Metabolic Panel  Recent Labs  08/10/15 0447  NA 139  K 4.1  CL 104  CO2 27  GLUCOSE 107*  BUN 25*  CREATININE 0.86  CALCIUM 9.7   Liver Function Tests  Recent Labs  08/10/15 0447  AST 23  ALT 28  ALKPHOS 53  BILITOT 0.8  PROT 5.7*  ALBUMIN 3.0*   No results for input(s): LIPASE, AMYLASE in the last 72 hours. Cardiac Enzymes  Recent Labs  08/09/15 1417 08/09/15 1855  TROPONINI 1.34* 1.06*   Fasting Lipid Panel  Recent Labs  08/09/15 1417  CHOL 140  HDL 46  LDLCALC 70  TRIG 118  CHOLHDL 3.0   Thyroid Function Tests  Recent Labs  08/09/15 1417  TSH 3.404    TELE  Atrial flutter HR in low 100s  Radiology/Studies  Dg Chest 2 View  08/09/2015  CLINICAL DATA:  Post thoracic aortic aneurysm repair 08/02/2015 EXAM: CHEST  2 VIEW COMPARISON:  08/07/2015 FINDINGS: Enlargement of cardiac silhouette post median sternotomy. Epicardial pacing wires noted. Stable mediastinal contours and pulmonary vascularity. Bibasilar  effusions and atelectasis greater on LEFT little changed. No acute infiltrate or pneumothorax. Bones unremarkable. IMPRESSION: Persistent bibasilar at atelectasis and pleural effusions greater on LEFT, unchanged. Enlargement of cardiac silhouette. Electronically Signed   By: Ulyses Southward M.D.   On: 08/09/2015 09:12   Ct Angio Chest Pe W/cm &/or Wo Cm  08/02/2015  CLINICAL DATA:  59 yr old male with chest pain radiating to left side of jaw. Diaphoretic. Had about a week ago but didn't seek medical attention. No known heart problems. EXAM: CT ANGIOGRAPHY CHEST WITH CONTRAST TECHNIQUE: Multidetector CT imaging of the chest was performed using the standard protocol during bolus administration of intravenous contrast. Multiplanar CT image reconstructions and MIPs were obtained to evaluate the vascular anatomy. CONTRAST:  OMNIPAQUE IOHEXOL 350 MG/ML SOLN COMPARISON:  Current chest radiographs. FINDINGS: Angiographic  study: No evidence of a pulmonary embolus. Aorta is not opacified. It is dilated. Ascending aorta measures 4.7 cm in diameter approximately 4 cm above the aortic valve and 5.2 cm at the aortic root. Aortic branch vessels are normal in caliber. Thoracic inlet:  No mass or adenopathy.  Thyroid unremarkable. Mediastinum and hila: Heart mildly enlarged. No mediastinal or hilar masses or pathologically enlarged lymph nodes. Lungs and pleura: Mild dependent lower lobe subsegmental atelectasis. No evidence of pneumonia or edema. No mass or suspicious nodule. No pleural effusion or pneumothorax. Limited upper abdomen: Probable small cyst from the upper pole left kidney. Otherwise unremarkable. Musculoskeletal: Mild degenerative spurring noted along the thoracic spine. No osteoblastic or osteolytic lesions. Review of the MIP images confirms the above findings. IMPRESSION: 1. No evidence of a pulmonary embolus. 2. 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 3. No acute findings. Lungs are clear other than mild dependent subsegmental atelectasis. Electronically Signed   By: Amie Portland M.D.   On: 08/02/2015 20:42   Dg Chest Port 1 View  08/07/2015  CLINICAL DATA:  Aortic dissection . EXAM: PORTABLE CHEST 1 VIEW COMPARISON:  08/06/2015. FINDINGS: Interim removal right IJ sheath. Prior CABG. Cardiomegaly. Left lower lobe atelectasis and/or infiltrate. Small left pleural effusion unchanged. No pneumothorax. IMPRESSION: 1. Interval removal right IJ sheath. 2. Prior median sternotomy. Prior thoracic aneurysm repair. Mediastinum is stable. 3. Persistent left lower lobe atelectasis and/or infiltrate. Small left pleural effusion. No interim change . Electronically Signed   By: Maisie Fus  Register   On: 08/07/2015 07:39   Dg Chest Port 1 View  08/06/2015  CLINICAL DATA:  Aortic dissection. EXAM: PORTABLE CHEST 1 VIEW COMPARISON:  08/05/2015. FINDINGS: Right IJ sheath in stable  position. Interval removal of mediastinal drainage catheters. New prior median sternotomy and thoracic aneurysm repair. Mediastinum is stable. Stable cardiomegaly. Persistent left lower lobe atelectasis and/or infiltrate. Improving right base atelectasis. Small left pleural effusion. No pneumothorax. IMPRESSION: 1. Interval removal of mediastinal drainage catheters. Right IJ sheath in stable position. No pneumothorax. 2. Prior median sternotomy and thoracic aneurysm repair. Mediastinum is stable. Stable cardiomegaly. 3. Persistent left lower lobe atelectasis and/or infiltrate. Interim clearing of right base atelectasis. Small left pleural effusion . Electronically Signed   By: Maisie Fus  Register   On: 08/06/2015 07:54   Dg Chest Port 1 View  08/05/2015  CLINICAL DATA:  Status post repair of type 1 aortic dissection. EXAM: PORTABLE CHEST 1 VIEW COMPARISON:  08/04/2015 FINDINGS: Endotracheal tube, enteric tube, and Swan-Ganz catheter have been removed. Right jugular sheath remains in place with tip overlying the upper SVC. Mediastinal drains  remain in place. Sternotomy wires are again noted. Cardiac silhouette is mildly enlarged. Lung volumes are slightly diminished compared to the prior study. Bibasilar lung opacities and pleural effusions have not significantly changed. No pneumothorax is identified. IMPRESSION: Interval extubation. Persistent bibasilar atelectasis and small pleural effusions. Electronically Signed   By: Sebastian Ache M.D.   On: 08/05/2015 07:44   Dg Chest Port 1 View  08/04/2015  CLINICAL DATA:  Thoracic ascending aortic aneurysm repair EXAM: PORTABLE CHEST 1 VIEW COMPARISON:  08/03/2015 FINDINGS: Endotracheal tube has been withdrawn and is now in good position. Swan-Ganz catheter in the main pulmonary artery. Pericardial and mediastinal drains in good position. NG tube in place. Negative for pneumothorax Increase in bibasilar atelectasis and small pleural effusions. Negative for edema.  IMPRESSION: Endotracheal tube and support lines in good position Progressive bibasilar atelectasis and small pleural effusions. Electronically Signed   By: Marlan Palau M.D.   On: 08/04/2015 08:10   Dg Chest Port 1 View  08/03/2015  CLINICAL DATA:  59 year old male with a history of type 1 aortic dissection repair EXAM: PORTABLE CHEST 1 VIEW COMPARISON:  Plain film 08/03/2015, CT 08/02/2015 FINDINGS: Early postoperative changes of type 1 aortic dissection repair. Slight right rotation the patient. There is slightly increased diameter of the upper mediastinum on the current plain film compared to the prior CT, which may be secondary to patient positioning. Unchanged position of endotracheal tube, which terminates just above the carina and is directed towards the right mainstem. At least 3 mediastinal drains/pleural drains project over the mediastinum. Gastric tube terminates within the left upper quadrant, with the side port at the gastroesophageal junction. Unchanged position of the right IJ approach sheath, through which a Swan-Ganz catheter terminates in the region of the pulmonary outflow tract. Surgical changes of median sternotomy. Lung volumes are low with ill-defined opacities at the left greater than right base. IMPRESSION: Early postoperative changes of median sternotomy and a type 1 dissection repair. The superior mediastinum is slightly widened compared to the recent plain film comparison, though this may be secondary to slight right rotation. If there is concern for acute abnormality, correlation with either cardiac echo or contrast-enhanced CT may be useful. Endotracheal tube terminates just above the carina directed towards the right mainstem. Withdrawal of 3 cm to 7 cm may be useful for better position. Unchanged position of multiple mediastinal/ pleural drains, right IJ sheath through which a Swan-Ganz catheter is transmitted, and gastric tube. Atelectasis at the left base. These results were  called by telephone at the time of interpretation on 08/03/2015 at 3:42 pm to the nurse caring for the patient, Ms Louie Bun, who verbally acknowledged these results. Electronically Signed   By: Gilmer Mor D.O.   On: 08/03/2015 15:42   Dg Chest Port 1 View  08/03/2015  CLINICAL DATA:  Postop CABG evaluate for suction instrument 4" long x 0.75" wide EXAM: PORTABLE CHEST 1 VIEW COMPARISON:  08/02/2015 FINDINGS: Swan-Ganz central venous catheter with tip in the right main pulmonary artery. Endotracheal tube with tip about 5 mm above the carina. There are two left chest tubes and a mediastinal drain. There is a nasogastric tube extending off the inferior edge of the film with the side hole over the distal esophagus. There is postoperative hazy attenuation at both lung bases likely representing atelectasis. No evidence postoperative edema or pneumothorax. There is no evidence of the described suction device. IMPRESSION: Two left chest tubes, mediastinal drain, NG tube, ET tube, and swan ganz  line. Electronically Signed   By: Esperanza Heir M.D.   On: 08/03/2015 10:07   Dg Chest Port 1 View  08/02/2015  CLINICAL DATA:  Chest pain starting today EXAM: PORTABLE CHEST 1 VIEW COMPARISON:  08/25/2007 FINDINGS: The heart size and mediastinal contours are within normal limits. Both lungs are clear. The visualized skeletal structures are unremarkable. IMPRESSION: No active disease. Electronically Signed   By: Natasha Mead M.D.   On: 08/02/2015 17:36    ASSESSMENT AND PLAN  1. NSTEMI: Troponin 1.69 --> 1.34 --1.06, New LV dysfunction with regional wall abnormalities in the LAD territory. He underwent LHC on 08/11/15 which revealed no significant CAD. Had recurrent chest pain yesterday. This is now resolved.  2. Atrial fibrillation/flutter post op thoracic surgery. Minimally symptomatic. No Ecg criteria for STEMI. CHAD-vasc score of 3-4 with HASBLED score of 3. Anticoagulation was held as fairly recent GI  bleeding from UC. -- He is fairly well rate controlled on amiodarone 400 mg bid and metoprolol to 50 mg bid. Hopefully will convert to NSR. TSH normal  3. 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 S/p surgery as noted.  4. Acute systolic CHF-  impaired EF on 2D ECHO (08/10/15) and new WMA in LAD territory new from previous ECHO 2 days earlier. LHC with no significant CAD as above. -- Continue Lopressor  BID. I will add a low dose of lisinopril 2.5mg  qd. His renal function has been normal but no recent labs. I will get a BMET -- He appears slightly volume overloaded on exam. I will give  IV lasix today. He can continue on Lasix  po BID.   5. HTN better controlled. 6. Ulcerative colitis. Consider tapering steroids to maintenance dose.  7. Remote TIA 8. Urinary retention- now on Flomax. 9. ? Hyperlipidemia. Lipid panel this admission with no significant hyperlipidemia. LDL 70  SignedJanetta Hora PA-C  Pager [phone]  The patient was seen, examined and discussed with Carlean Jews, PA-C and I agree with the above.   The patient underwent a cardiac cath yesterday, it showed no CAD. He remains in atrial flutter with 2:1 block and ventricular rate 106 BPM. It is possible that his low EF was rate elated sec to a-fib/flutter with RVR. We could consider TEE/DCCV if he doesn't cardiovert on his own.  Lars Masson 08/12/2015

## 2015-08-12 NOTE — Progress Notes (Signed)
CARDIAC REHAB PHASE I   PRE:  Rate/Rhythm: 107 aflutter    BP: sitting 104/79    SaO2: 99 RA  MODE:  Ambulation: 890 ft   POST:  Rate/Rhythm: 106 aflutter    BP: sitting 125/78     SaO2: 100 RA  Pt moving well with only standby assist. Sts he feels slightly unsteady due to swollen feet/legs. Quick pace walking. HR did not change. Somewhat SOB with walking, toward end. To recliner. No major c/o. [id number]   Elissa LovettReeve, Deron Poole GattmanKristan CES, ACSM 08/12/2015 1:20 PM

## 2015-08-12 NOTE — Progress Notes (Signed)
Physical Therapy Treatment Patient Details Name: Ryan Cole MRN: [id number] DOB: 1956-03-27 Today's Date: 08/12/2015    History of Present Illness pt is a 59 y.o. 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  Pt to OR for emergent TAA repair. Bleeding complications post op.   Extubated 10/18.      PT Comments    Progressing well.  Fluidity of movement hindered by lower body edema, but pt safe with gait and general mobility when medically ready for d/c.  Follow Up Recommendations  No PT follow up;Supervision - Intermittent;Supervision for mobility/OOB     Equipment Recommendations  None recommended by PT;Other (comment)    Recommendations for Other Services       Precautions / Restrictions Precautions Precautions: Fall;Sternal Restrictions Weight Bearing Restrictions: No    Mobility  Bed Mobility Overal bed mobility: Modified Independent             General bed mobility comments: follows sternal precautions  Transfers Overall transfer level: Modified independent                  Ambulation/Gait Ambulation/Gait assistance: Modified independent (Device/Increase time);Supervision (supervision out in halls) Ambulation Distance (Feet): 800 Feet Assistive device: None Gait Pattern/deviations: Step-through pattern Gait velocity: slower due to swelling, but able to speed up significantly for short periods. Gait velocity interpretation: at or above normal speed for age/gender General Gait Details: steady gait   Stairs Stairs: Yes Stairs assistance: Supervision Stair Management: One rail Right;Forwards;Alternating pattern;Step to pattern Number of Stairs: 6 General stair comments: safe with rail, but tentative due to swelling  Wheelchair Mobility    Modified Rankin (Stroke Patients Only)       Balance Overall balance assessment: No apparent balance deficits (not formally assessed)   Sitting balance-Leahy Scale: Good       Standing balance-Leahy Scale: Good               High level balance activites: Backward walking;Direction changes;Turns;Sudden stops;Head turns High Level Balance Comments: no overt deviation or LOB.    Cognition Arousal/Alertness: Awake/alert Behavior During Therapy: WFL for tasks assessed/performed Overall Cognitive Status: Within Functional Limits for tasks assessed                      Exercises      General Comments General comments (skin integrity, edema, etc.): Unable to get readings on EHR or SpO2, due to pt's finger very cold.      Pertinent Vitals/Pain Pain Assessment: Faces Faces Pain Scale: Hurts a little bit Pain Location: groin, "middle section" Pain Descriptors / Indicators: Tightness Pain Intervention(s): Monitored during session    Home Living                      Prior Function            PT Goals (current goals can now be found in the care plan section) Acute Rehab PT Goals PT Goal Formulation: With patient Potential to Achieve Goals: Good Progress towards PT goals: Progressing toward goals    Frequency  Min 3X/week    PT Plan Discharge plan needs to be updated    Co-evaluation             End of Session   Activity Tolerance: Patient tolerated treatment well Patient left: in chair;with call bell/phone within reach;with family/visitor present     Time: 4132-4401 PT Time Calculation (min) (ACUTE ONLY): 20 min  Charges:  $  Gait Training: 8-22 mins                    G Codes:      Telissa Palmisano, Eliseo GumKenneth V 08/12/2015, 12:09 PM 08/12/2015  Pinal BingKen Ahtziri Jeffries, PT [phone]  (pager)

## 2015-08-12 NOTE — Progress Notes (Signed)
Occupational Therapy Treatment and Discharge.  Patient Details Name: Ryan Cole MRN: [id number] DOB: Jan 01, 1956 Today's Date: 08/12/2015    History of present illness pt is a 59 y.o. 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  Pt to OR for emergent TAA repair. Bleeding complications post op.   Extubated 10/18.     OT comments  Pt is modified independent with ADLs.  He demonstrates good understanding of sternal precautions.  Goals achieved, OT will sign off.  Pt agreeable with plan and goals achieved.   Follow Up Recommendations  No OT follow up    Equipment Recommendations  None recommended by OT    Recommendations for Other Services      Precautions / Restrictions Precautions Precautions: Fall;Sternal       Mobility Bed Mobility                  Transfers Overall transfer level: Modified independent                    Balance Overall balance assessment: Modified Independent                                 ADL Overall ADL's : Modified independent                                       General ADL Comments: Pt able to simulate shower transfer mod I.  Reviewed sternal precautions.  He demonstrates good safety awareness       Vision                     Perception     Praxis      Cognition   Behavior During Therapy: WFL for tasks assessed/performed Overall Cognitive Status: Within Functional Limits for tasks assessed                       Extremity/Trunk Assessment               Exercises     Shoulder Instructions       General Comments      Pertinent Vitals/ Pain       Pain Assessment: No/denies pain  Home Living                                          Prior Functioning/Environment              Frequency       Progress Toward Goals  OT Goals(current goals can now be found in the care plan section)  Progress towards  OT goals: Goals met/education completed, patient discharged from [place] All goals met and education completed, patient discharged from OT services    Co-evaluation                 End of Session     Activity Tolerance Patient tolerated treatment well   Patient Left in bed   Nurse Communication          Time: 9562-1308 OT Time Calculation (min): 9 min  Charges: OT General Charges $OT Visit: 1 Procedure OT Treatments $Self Care/Home Management :  8-22 mins  Ryan Cole 08/12/2015, 4:34 PM

## 2015-08-12 NOTE — Progress Notes (Addendum)
[address]       [place] [address]             [phone]          1 Day Post-Op Procedure(s) (LRB): Left Heart Cath and Coronary Angiography (N/A)  Subjective: No more chest discomfort since last night. Main issue today is difficulty voiding since Foley removed. Pt states "I feel like they're going to have to drain me again."   Objective: Vital signs in last 24 hours: Patient Vitals for the past 24 hrs:  BP Temp Temp src Pulse Resp SpO2 Weight  08/12/15 0454 123/81 mmHg 98 F (36.7 C) Oral (!) 107 18 100 % 208 lb 15.9 oz (94.8 kg)  08/11/15 2044 103/69 mmHg 98.6 F (37 C) Oral (!) 111 - 100 % -  08/11/15 1712 129/78 mmHg - - (!) 114 - - -  08/11/15 1700 - - - - - 100 % -  08/11/15 1600 134/82 mmHg - - (!) 111 - 100 % -  08/11/15 1545 (!) 138/91 mmHg - - - - - -  08/11/15 1537 134/88 mmHg - - (!) 112 - - -  08/11/15 1515 115/79 mmHg - - (!) 113 - - -  08/11/15 1500 (!) 142/84 mmHg - - (!) 112 (!) 24 - -  08/11/15 1455 (!) 141/90 mmHg - - - - - -  08/11/15 1444 122/89 mmHg - - (!) 114 18 100 % -  08/11/15 1427 (!) 137/97 mmHg 97.5 F (36.4 C) Oral (!) 113 17 - -  08/11/15 1408 117/82 mmHg - - (!) 114 18 100 % -  08/11/15 1403 117/83 mmHg - - (!) 108 18 99 % -  08/11/15 1358 121/81 mmHg - - (!) 116 15 99 % -  08/11/15 1353 120/81 mmHg - - (!) 114 11 98 % -  08/11/15 1348 (!) 129/91 mmHg - - (!) 109 15 99 % -  08/11/15 1343 (!) 141/97 mmHg - - (!) 112 12 100 % -  08/11/15 1338 (!) 136/93 mmHg - - (!) 115 13 100 % -  08/11/15 1335 - - - (!) 113 20 (!) 0 % -  08/11/15 1335 - - - (!) 112 - - -  08/11/15 1334 - - - - - 100 % -  08/11/15 1256 133/88 mmHg - - (!) 114 16 - -  08/11/15 0859 119/69 mmHg - - (!) 112 18 - -  08/11/15 0848 109/75 mmHg - - (!) 111 - - -   Current Weight  08/12/15 208 lb 15.9 oz (94.8 kg)  PRE-OPERATIVE WEIGHT: 88 kg   Intake/Output from previous day: 10/25 0701 - 10/26 0700 In: 240 [P.O.:240] Out: 1150  [Urine:1150]    PHYSICAL EXAM:  Heart: RRR, tachy 110s Lungs: Clear Wound: Clean and dry Extremities: +LE edema    Lab Results: CBC: Recent Labs  08/10/15 0447 08/11/15 0356  WBC 12.0* 12.3*  HGB 8.5* 8.5*  HCT 26.4* 27.1*  PLT 247 297   BMET:  Recent Labs  08/09/15 0815 08/10/15 0447  NA 142 139  K 4.4 4.1  CL 107 104  CO2 18* 27  GLUCOSE 110* 107*  BUN 24* 25*  CREATININE 0.95 0.86  CALCIUM 9.5 9.7    PT/INR:  Recent Labs  08/10/15 1908  LABPROT 15.2  INR 1.18      Assessment/Plan: S/P Procedure(s) (LRB): Left Heart Cath and Coronary Angiography (N/A)  CV- Aflutter, rates 100-110s.  Continue Amio, Lopressor. BPs trending up, so will increase Lopressor for better BP/rate control. Since he continues in flutter, will start anticoagulation with Eliquis. Cath negative yesterday.  Expected postop blood loss anemia- H/H generally stable.  Postop urinary retention- Still having some difficulty voiding after Foley d/c'ed. Will watch and I/O cath if needed. Continue Flomax. Hopefully will not have to replace Foley again.  H/o ulcerative colitis- Continue steroid taper.   Vol overload- Continue diuresis, will give additional Lasix today.  Deconditioning- Continue CRPI/PT.  Disp- hopefully home soon if he continues to progress.   LOS: 9 days    COLLINS,GINA H 08/12/2015  Patient seen and examined, agree with above Urinary retention a problem as noted above Atrial flutter persists- probably safest to go ahead and anticoagulate at this point  Viviann SpareSteven C. Dorris FetchHendrickson, MD [facility] [phone]

## 2015-08-13 DIAGNOSIS — I483 Typical atrial flutter: Secondary | ICD-10-CM | POA: Diagnosis not present

## 2015-08-13 DIAGNOSIS — I7101 Dissection of thoracic aorta: Secondary | ICD-10-CM | POA: Diagnosis not present

## 2015-08-13 DIAGNOSIS — I4892 Unspecified atrial flutter: Secondary | ICD-10-CM | POA: Diagnosis not present

## 2015-08-13 DIAGNOSIS — I480 Paroxysmal atrial fibrillation: Secondary | ICD-10-CM | POA: Diagnosis not present

## 2015-08-13 DIAGNOSIS — R7989 Other specified abnormal findings of blood chemistry: Secondary | ICD-10-CM | POA: Diagnosis not present

## 2015-08-13 DIAGNOSIS — D65 Disseminated intravascular coagulation [defibrination syndrome]: Secondary | ICD-10-CM | POA: Diagnosis not present

## 2015-08-13 DIAGNOSIS — I214 Non-ST elevation (NSTEMI) myocardial infarction: Secondary | ICD-10-CM | POA: Diagnosis not present

## 2015-08-13 DIAGNOSIS — I5021 Acute systolic (congestive) heart failure: Secondary | ICD-10-CM | POA: Diagnosis not present

## 2015-08-13 LAB — CBC
HEMATOCRIT: 26.1 % — AB (ref 39.0–52.0)
HEMOGLOBIN: 8.6 g/dL — AB (ref 13.0–17.0)
MCH: 30.4 pg (ref 26.0–34.0)
MCHC: 33 g/dL (ref 30.0–36.0)
MCV: 92.2 fL (ref 78.0–100.0)
Platelets: 331 10*3/uL (ref 150–400)
RBC: 2.83 MIL/uL — AB (ref 4.22–5.81)
RDW: 16.2 % — ABNORMAL HIGH (ref 11.5–15.5)
WBC: 11.5 10*3/uL — AB (ref 4.0–10.5)

## 2015-08-13 LAB — BASIC METABOLIC PANEL
Anion gap: 9 (ref 5–15)
BUN: 19 mg/dL (ref 6–20)
CALCIUM: 9 mg/dL (ref 8.9–10.3)
CO2: 24 mmol/L (ref 22–32)
CREATININE: 1.01 mg/dL (ref 0.61–1.24)
Chloride: 104 mmol/L (ref 101–111)
Glucose, Bld: 110 mg/dL — ABNORMAL HIGH (ref 65–99)
Potassium: 4.1 mmol/L (ref 3.5–5.1)
SODIUM: 137 mmol/L (ref 135–145)

## 2015-08-13 MED ORDER — SODIUM CHLORIDE 0.9 % IJ SOLN: 3.0000 mL | INTRAMUSCULAR | Status: DC | PRN

## 2015-08-13 MED ORDER — SODIUM CHLORIDE 0.9 % IV SOLN: 250.0000 mL | INTRAVENOUS | Status: DC

## 2015-08-13 MED ORDER — SODIUM CHLORIDE 0.9 % IJ SOLN
3.0000 mL | Freq: Two times a day (BID) | INTRAMUSCULAR | Status: DC
  Administered 2015-08-13 – 2015-08-16 (×6): 3 mL via INTRAVENOUS

## 2015-08-13 MED ORDER — SODIUM CHLORIDE 0.9 % IV SOLN
INTRAVENOUS | Status: DC
  Administered 2015-08-14 (×2): via INTRAVENOUS

## 2015-08-13 NOTE — Progress Notes (Signed)
Utilization review completed.  

## 2015-08-13 NOTE — Progress Notes (Signed)
Pt having difficulty urinating, pt reports that they have to strain when attempting to urinate, 2140 pt felt uncomfortable, bladder scan amount was 574 mL, intermittent cath amount was 600 mL, when removing cathether RN noted a little amount of blood at the insertion tip, RN asked patient about previous blood in urine and pt reported that they noticed a little earlier in the day after straining to urinate in the bathroom, however pt reported relief from intermittent cath, [phone] pt had discomfort again, bladder scan amount was 343 mL, intermittent cath was 375 mL, pt reported relief, pt very concerned about inability to urinate naturally after the need to use the intermittent cath twice during the night, RN will continue to monitor, pt now resting, pt call bell within reach and bed in lowest position, RN will pass this information on to the next shift RN.   Mila PalmerJohnson,Ingvald Theisen A, RN  08/13/2015

## 2015-08-13 NOTE — Progress Notes (Signed)
CARDIAC REHAB PHASE I   PRE:  Rate/Rhythm: 108 aflutter    BP: sitting 78/52, standing (and had to sit after 1 min) 79/57    SaO2: 97 RA  MODE:  Ambulation: to bed   POST:  Rate/Rhythm: 110 aflutter    BP: sitting 86/61     SaO2:   Came to ambulate pt. He has been moving around room this am however BP low. Sts he does feel dizzy, probably for about an hr. Pt felt worse with standing, also SOB. Pt assisted to bed, supine in bed. BP up slightly. Pt seemed nervous. Had wanted to walk. Will f/u. PT later today. [phone]   Elissa LovettReeve, Chandan Fly Union GroveKristan CES, ACSM 08/13/2015 9:08 AM

## 2015-08-13 NOTE — Progress Notes (Signed)
Speech Language Pathology Treatment: Cognitive-Linquistic  Patient Details Name: BEACHER EVERY MRN: [id number] DOB: [birth year] Today's Date: 08/13/2015 Time: 5396-7289 SLP Time Calculation (min) (ACUTE ONLY): 10 min  Assessment / Plan / Recommendation Clinical Impression  Pt's cognitive-linguistic deficits from last week have now resolved. Pt reports he does not recall much of the days following surgery. Language in conversation is fluent, appropriate semantics. No difficulty recalling visitors, meals and current medical status and plan for treating A-flutter, low BP today and urinary retention. Demonstrated executive functioning abilities related to going back to work (considering multiple factors that may impede current job). Goals met; no further ST needed.    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 Pt to OR for emergent TAA repair. Bleeding complications post op. Extubated 10/18.    Pertinent Vitals Pain Assessment: No/denies pain  SLP Plan  All goals met;Discharge SLP treatment due to (comment)    Recommendations                Oral Care Recommendations: Oral care BID Follow up Recommendations: None Plan: All goals met;Discharge SLP treatment due to (comment)         Houston Siren 08/13/2015, 10:38 AM   Orbie Pyo Colvin Caroli.Ed [employer] [phone]

## 2015-08-13 NOTE — Progress Notes (Addendum)
Patient Name: Ryan MorinDaryl V Cole Date of Encounter: 08/13/2015  Primary Cardiologist: new   Active Problems:   Thoracic aortic dissection (HCC)   HTN (hypertension)   Ulcerative colitis (HCC)   Atrial fibrillation (HCC)   Abnormal echocardiogram   Elevated troponin    SUBJECTIVE  Mild SOB. No CP.  CURRENT MEDS . amiodarone  400 mg Oral BID  . apixaban  5 mg Oral BID  . aspirin EC  81 mg Oral Daily  . atorvastatin  80 mg Oral q1800  . furosemide  40 mg Intravenous BID  . lisinopril  2.5 mg Oral Daily  . mesalamine  1.5 g Oral Daily  . metoprolol tartrate  50 mg Oral BID  . pantoprazole  40 mg Oral QAC breakfast  . potassium chloride  20 mEq Oral Daily  . predniSONE  20 mg Oral Q breakfast  . tamsulosin  0.4 mg Oral QPC breakfast    OBJECTIVE  Filed Vitals:   08/13/15 0500 08/13/15 0850 08/13/15 0900 08/13/15 1004  BP:  79/55 86/61 89/64   Pulse:      Temp:      TempSrc:      Resp:      Height:      Weight: 204 lb 12.9 oz (92.9 kg)     SpO2:        Intake/Output Summary (Last 24 hours) at 08/13/15 1052 Last data filed at 08/13/15 0730  Gross per 24 hour  Intake    840 ml  Output   1425 ml  Net   -585 ml   Filed Weights   08/11/15 0518 08/12/15 0454 08/13/15 0500  Weight: 206 lb 12.7 oz (93.8 kg) 208 lb 15.9 oz (94.8 kg) 204 lb 12.9 oz (92.9 kg)    PHYSICAL EXAM  General: Pleasant, NAD. Neuro: Alert and oriented X 3. Moves all extremities spontaneously. Psych: Normal affect. HEENT:  Normal  Neck: Supple without bruits or JVD. Lungs:  Resp regular and unlabored. CTA, no rale, markedly decreased breath sound in bilateral bases, worse on the left Heart: tachycardia. no s3, s4, or murmurs. Abdomen: Soft, non-tender, non-distended, BS + x 4.  Extremities: No clubbing, cyanosis. DP/PT/Radials 2+ and equal bilaterally. 1+ bilateral pitting edema   Accessory Clinical Findings  CBC  Recent Labs  08/11/15 0356 08/13/15 0240  WBC 12.3* 11.5*  HGB 8.5*  8.6*  HCT 27.1* 26.1*  MCV 92.2 92.2  PLT 297 331   Basic Metabolic Panel  Recent Labs  08/12/15 1520 08/13/15 0240  NA 139 137  K 4.3 4.1  CL 102 104  CO2 24 24  GLUCOSE 102* 110*  BUN 19 19  CREATININE 1.05 1.01  CALCIUM 9.4 9.0    TELE 2:1 aflutter with HR 110s    ECG  2:1 atrial flutter with RVR  Echocardiogram 08/10/2015  LV EF: 35%  ------------------------------------------------------------------- Indications:   Atrial fibrillation - 427.31.  ------------------------------------------------------------------- History:  PMH: S/P thoracic ascending aneurysm repair. Ulcerative colitis. Stroke. Risk factors: Hypertension. Dyslipidemia.  ------------------------------------------------------------------- Study Conclusions  - Left ventricle: The cavity size was normal. There was moderate concentric hypertrophy. Systolic function was moderately to severely reduced. The estimated ejection fraction was 35%. There is akinesis of the entire anterior, basal and mid anteroseptal, inferoseptal and apical septal walls. The study was not technically sufficient to allow evaluation of LV diastolic dysfunction due to atrial flutter. - Ventricular septum: Septal motion showed paradox. - Aortic valve: Trileaflet; normal thickness leaflets. There was mild regurgitation. - Aortic  root: The aortic root was normal in size. - Ascending aorta: S/P ascending aneurysm repair, normal size. - Mitral valve: Structurally normal valve. - Left atrium: The atrium was mildly dilated. - Right ventricle: Systolic function was mildly reduced. - Right atrium: The atrium was mildly dilated. - Tricuspid valve: There was mild regurgitation. - Pulmonic valve: There was trivial regurgitation. - Pericardium, extracardiac: A small circumferential pericardial effusion was identified with no signs of tamponade.  Impressions:  - Compared to the prior study on 08/04/2015  there are now new wall motion abnormalities in the LAD territory and impaired LVEF now 35%, previoulsy 50-55%.    Radiology/Studies  Dg Chest 2 View  08/09/2015  CLINICAL DATA:  Post thoracic aortic aneurysm repair 08/02/2015 EXAM: CHEST  2 VIEW COMPARISON:  08/07/2015 FINDINGS: Enlargement of cardiac silhouette post median sternotomy. Epicardial pacing wires noted. Stable mediastinal contours and pulmonary vascularity. Bibasilar effusions and atelectasis greater on LEFT little changed. No acute infiltrate or pneumothorax. Bones unremarkable. IMPRESSION: Persistent bibasilar at atelectasis and pleural effusions greater on LEFT, unchanged. Enlargement of cardiac silhouette. Electronically Signed   By: Ulyses Southward M.D.   On: 08/09/2015 09:12   Ct Angio Chest Pe W/cm &/or Wo Cm  08/02/2015  CLINICAL DATA:  59 yr old male with chest pain radiating to left side of jaw. Diaphoretic. Had about a week ago but didn't seek medical attention. No known heart problems. EXAM: CT ANGIOGRAPHY CHEST WITH CONTRAST TECHNIQUE: Multidetector CT imaging of the chest was performed using the standard protocol during bolus administration of intravenous contrast. Multiplanar CT image reconstructions and MIPs were obtained to evaluate the vascular anatomy. CONTRAST:  OMNIPAQUE IOHEXOL 350 MG/ML SOLN COMPARISON:  Current chest radiographs. FINDINGS: Angiographic study: No evidence of a pulmonary embolus. Aorta is not opacified. It is dilated. Ascending aorta measures 4.7 cm in diameter approximately 4 cm above the aortic valve and 5.2 cm at the aortic root. Aortic branch vessels are normal in caliber. Thoracic inlet:  No mass or adenopathy.  Thyroid unremarkable. Mediastinum and hila: Heart mildly enlarged. No mediastinal or hilar masses or pathologically enlarged lymph nodes. Lungs and pleura: Mild dependent lower lobe subsegmental atelectasis. No evidence of pneumonia or edema. No mass or suspicious nodule. No pleural  effusion or pneumothorax. Limited upper abdomen: Probable small cyst from the upper pole left kidney. Otherwise unremarkable. Musculoskeletal: Mild degenerative spurring noted along the thoracic spine. No osteoblastic or osteolytic lesions. Review of the MIP images confirms the above findings. IMPRESSION: 1. No evidence of a pulmonary embolus. 2. 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 3. No acute findings. Lungs are clear other than mild dependent subsegmental atelectasis. Electronically Signed   By: Amie Portland M.D.   On: 08/02/2015 20:42   Dg Chest Port 1 View  08/07/2015  CLINICAL DATA:  Aortic dissection . EXAM: PORTABLE CHEST 1 VIEW COMPARISON:  08/06/2015. FINDINGS: Interim removal right IJ sheath. Prior CABG. Cardiomegaly. Left lower lobe atelectasis and/or infiltrate. Small left pleural effusion unchanged. No pneumothorax. IMPRESSION: 1. Interval removal right IJ sheath. 2. Prior median sternotomy. Prior thoracic aneurysm repair. Mediastinum is stable. 3. Persistent left lower lobe atelectasis and/or infiltrate. Small left pleural effusion. No interim change . Electronically Signed   By: Maisie Fus  Register   On: 08/07/2015 07:39   Dg Chest Port 1 View  08/06/2015  CLINICAL DATA:  Aortic dissection. EXAM: PORTABLE CHEST 1 VIEW COMPARISON:  08/05/2015. FINDINGS: Right IJ sheath in  stable position. Interval removal of mediastinal drainage catheters. New prior median sternotomy and thoracic aneurysm repair. Mediastinum is stable. Stable cardiomegaly. Persistent left lower lobe atelectasis and/or infiltrate. Improving right base atelectasis. Small left pleural effusion. No pneumothorax. IMPRESSION: 1. Interval removal of mediastinal drainage catheters. Right IJ sheath in stable position. No pneumothorax. 2. Prior median sternotomy and thoracic aneurysm repair. Mediastinum is stable. Stable cardiomegaly. 3. Persistent left lower lobe atelectasis and/or  infiltrate. Interim clearing of right base atelectasis. Small left pleural effusion . Electronically Signed   By: Maisie Fus  Register   On: 08/06/2015 07:54   Dg Chest Port 1 View  08/05/2015  CLINICAL DATA:  Status post repair of type 1 aortic dissection. EXAM: PORTABLE CHEST 1 VIEW COMPARISON:  08/04/2015 FINDINGS: Endotracheal tube, enteric tube, and Swan-Ganz catheter have been removed. Right jugular sheath remains in place with tip overlying the upper SVC. Mediastinal drains remain in place. Sternotomy wires are again noted. Cardiac silhouette is mildly enlarged. Lung volumes are slightly diminished compared to the prior study. Bibasilar lung opacities and pleural effusions have not significantly changed. No pneumothorax is identified. IMPRESSION: Interval extubation. Persistent bibasilar atelectasis and small pleural effusions. Electronically Signed   By: Sebastian Ache M.D.   On: 08/05/2015 07:44   Dg Chest Port 1 View  08/04/2015  CLINICAL DATA:  Thoracic ascending aortic aneurysm repair EXAM: PORTABLE CHEST 1 VIEW COMPARISON:  08/03/2015 FINDINGS: Endotracheal tube has been withdrawn and is now in good position. Swan-Ganz catheter in the main pulmonary artery. Pericardial and mediastinal drains in good position. NG tube in place. Negative for pneumothorax Increase in bibasilar atelectasis and small pleural effusions. Negative for edema. IMPRESSION: Endotracheal tube and support lines in good position Progressive bibasilar atelectasis and small pleural effusions. Electronically Signed   By: Marlan Palau M.D.   On: 08/04/2015 08:10   Dg Chest Port 1 View  08/03/2015  CLINICAL DATA:  59 year old male with a history of type 1 aortic dissection repair EXAM: PORTABLE CHEST 1 VIEW COMPARISON:  Plain film 08/03/2015, CT 08/02/2015 FINDINGS: Early postoperative changes of type 1 aortic dissection repair. Slight right rotation the patient. There is slightly increased diameter of the upper mediastinum on the  current plain film compared to the prior CT, which may be secondary to patient positioning. Unchanged position of endotracheal tube, which terminates just above the carina and is directed towards the right mainstem. At least 3 mediastinal drains/pleural drains project over the mediastinum. Gastric tube terminates within the left upper quadrant, with the side port at the gastroesophageal junction. Unchanged position of the right IJ approach sheath, through which a Swan-Ganz catheter terminates in the region of the pulmonary outflow tract. Surgical changes of median sternotomy. Lung volumes are low with ill-defined opacities at the left greater than right base. IMPRESSION: Early postoperative changes of median sternotomy and a type 1 dissection repair. The superior mediastinum is slightly widened compared to the recent plain film comparison, though this may be secondary to slight right rotation. If there is concern for acute abnormality, correlation with either cardiac echo or contrast-enhanced CT may be useful. Endotracheal tube terminates just above the carina directed towards the right mainstem. Withdrawal of 3 cm to 7 cm may be useful for better position. Unchanged position of multiple mediastinal/ pleural drains, right IJ sheath through which a Swan-Ganz catheter is transmitted, and gastric tube. Atelectasis at the left base. These results were called by telephone at the time of interpretation on 08/03/2015 at 3:42 pm to the nurse  caring for the patient, Ms Louie Bun, who verbally acknowledged these results. Electronically Signed   By: Gilmer Mor D.O.   On: 08/03/2015 15:42   Dg Chest Port 1 View  08/03/2015  CLINICAL DATA:  Postop CABG evaluate for suction instrument 4" long x 0.75" wide EXAM: PORTABLE CHEST 1 VIEW COMPARISON:  08/02/2015 FINDINGS: Swan-Ganz central venous catheter with tip in the right main pulmonary artery. Endotracheal tube with tip about 5 mm above the carina. There are two left  chest tubes and a mediastinal drain. There is a nasogastric tube extending off the inferior edge of the film with the side hole over the distal esophagus. There is postoperative hazy attenuation at both lung bases likely representing atelectasis. No evidence postoperative edema or pneumothorax. There is no evidence of the described suction device. IMPRESSION: Two left chest tubes, mediastinal drain, NG tube, ET tube, and swan ganz line. Electronically Signed   By: Esperanza Heir M.D.   On: 08/03/2015 10:07   Dg Chest Port 1 View  08/02/2015  CLINICAL DATA:  Chest pain starting today EXAM: PORTABLE CHEST 1 VIEW COMPARISON:  08/25/2007 FINDINGS: The heart size and mediastinal contours are within normal limits. Both lungs are clear. The visualized skeletal structures are unremarkable. IMPRESSION: No active disease. Electronically Signed   By: Natasha Mead M.D.   On: 08/02/2015 17:36    ASSESSMENT AND PLAN  59 yo male presented with type A aortic dissection associated with severe AI, underwent emergent surgery on 08/02/15 with total aortic arch and root replacement, resuspension of native AV, and reimplantation of coronary arteries. Went into afib on 10/20, IV amiodarone started. Now in aflutter.   1. Post op paroxysmal atrial fibrillation/atrial flutter  - echo 08/10/2015 EF 35%, akinesis of entire anterior, basal, and mid anteroseptal, inferoseptal and apical septal wall, paradoxical ventricular motion, mild AR, s/p ascending aneurysm repair, small pericardial effusion.  - CHAD-vasc score of 3-4, started on eliquis  - hypotensive with SBP 70-80s this morning, unable to given BB, will discuss with Dr. Delton See, potentially add on for TEE/DCCV tomorrow.   2. 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 Acute systolic HF  - still fluid overloaded, continue IV lasix. No rale on exam, however has markedly decreased breath sound bilateral, 1/3 way up on L side, only minimal on right side,  consistent with pleural effusion seen on earlier CXR  4. Elevated trop: trop 1.69 -->1.34 --> 1.06  - given new wall motion abnormalities, cath 08/11/2015 no significant CAD  5. Recent GI bleeding from UC  6. HTN 7. Remote TIA  Signed, Amedeo Plenty Pager: [phone]   The patient was seen, examined and discussed with Azalee Course, PA-C and I agree with the above.   The patient remains if atrial flutter with CHF and decreased LVEF, fatigued and SOB, hypotensive, unable to ger BB this am sec to hypotension. We will schedule him for a TEE/DCCV for tomorrow.  Lars Masson 08/13/2015

## 2015-08-13 NOTE — Progress Notes (Addendum)
       [address]       [place] [id number]             [phone]          2 Days Post-Op Procedure(s) (LRB): Left Heart Cath and Coronary Angiography (N/A)  Subjective: Pt reports voiding with some difficulty earlier yesterday after Foley removed, but required I/O cath last night and earlier this am.  No other complaints today.   Objective: Vital signs in last 24 hours: Patient Vitals for the past 24 hrs:  BP Temp Temp src Pulse Resp SpO2 Weight  08/13/15 0500 - - - - - - 204 lb 12.9 oz (92.9 kg)  08/12/15 2030 116/74 mmHg 98.2 F (36.8 C) Oral (!) 111 18 100 % -  08/12/15 1242 104/79 mmHg - - - - - -  08/12/15 1043 125/84 mmHg - - (!) 110 - - -   Current Weight  08/13/15 204 lb 12.9 oz (92.9 kg)  PRE-OPERATIVE WEIGHT: 88 kg   Intake/Output from previous day: 10/26 0701 - 10/27 0700 In: 720 [P.O.:720] Out: 1425 [Urine:1425]    PHYSICAL EXAM:  Heart: RRR Lungs: Clear Wound: Clean and dry Extremities: + LE edema    Lab Results: CBC: Recent Labs  08/11/15 0356 08/13/15 0240  WBC 12.3* 11.5*  HGB 8.5* 8.6*  HCT 27.1* 26.1*  PLT 297 331   BMET:  Recent Labs  08/12/15 1520 08/13/15 0240  NA 139 137  K 4.3 4.1  CL 102 104  CO2 24 24  GLUCOSE 102* 110*  BUN 19 19  CREATININE 1.05 1.01  CALCIUM 9.4 9.0    PT/INR:  Recent Labs  08/10/15 1908  LABPROT 15.2  INR 1.18      Assessment/Plan: S/P Procedure(s) (LRB): Left Heart Cath and Coronary Angiography (N/A)  CV- Remains in Aflutter, rates 100-110s. No change with increased beta blocker dose. Continue Amio, Lopressor. BPs stable. Eliquis started.  Cardiology discussing possible TEE cardioversion.  Expected postop blood loss anemia- H/H generally stable.  Postop urinary retention- Still having difficulty voiding and requiring I/O cath.  Will replace Foley. Continue Flomax. I have spoken with Dr. Vernie Ammonsttelin of urology, and he recommends continuing the Foley and having the  patient follow up in their office as an outpatient for voiding trial.  H/o ulcerative colitis- Continue steroid taper.   Vol overload- Continue diuresis, weight down some but remains edematous. Will give Lasix bid today again.  Deconditioning- Continue CRPI/PT.  Disp- hopefully home soon if he continues to progress and CV/GU issues improved.   LOS: 10 days    Naveyah Iacovelli H 08/13/2015

## 2015-08-13 NOTE — Discharge Instructions (Addendum)
Information on my medicine - ELIQUIS (apixaban)  This medication education was reviewed with me or my healthcare representative as part of my discharge preparation.  The pharmacist that spoke with me during my hospital stay was:  Arman FilterClark, Ruth Prescott, [facility]  Why was Eliquis prescribed for you? Eliquis was prescribed for you to reduce the risk of a blood clot forming that can cause a stroke if you have a medical condition called atrial fibrillation (a type of irregular heartbeat).  What do You need to know about Eliquis ? Take your Eliquis TWICE DAILY - one tablet in the morning and one tablet in the evening with or without food. If you have difficulty swallowing the tablet whole please discuss with your pharmacist how to take the medication safely.  Take Eliquis exactly as prescribed by your doctor and DO NOT stop taking Eliquis without talking to the doctor who prescribed the medication.  Stopping may increase your risk of developing a stroke.  Refill your prescription before you run out.  After discharge, you should have regular check-up appointments with your healthcare provider that is prescribing your Eliquis.  In the future your dose may need to be changed if your kidney function or weight changes by a significant amount or as you get older.  What do you do if you miss a dose? If you miss a dose, take it as soon as you remember on the same day and resume taking twice daily.  Do not take more than one dose of ELIQUIS at the same time to make up a missed dose.  Important Safety Information A possible side effect of Eliquis is bleeding. You should call your healthcare provider right away if you experience any of the following: ? Bleeding from an injury or your nose that does not stop. ? Unusual colored urine (red or dark brown) or unusual colored stools (red or black). ? Unusual bruising for unknown reasons. ? A serious fall or if you hit your head (even if there is no  bleeding).  Some medicines may interact with Eliquis and might increase your risk of bleeding or clotting while on Eliquis. To help avoid this, consult your healthcare provider or pharmacist prior to using any new prescription or non-prescription medications, including herbals, vitamins, non-steroidal anti-inflammatory drugs (NSAIDs) and supplements.  This website has more information on Eliquis (apixaban): http://www.eliquis.com/eliquis/home  Activity: 1.May walk up steps                2.No lifting more than ten pounds for four weeks.                 3.No driving for four weeks.                4.Stop any activity that causes chest pain, shortness of breath, dizziness,  sweating or excessive weakness.                5.Avoid straining.                6.Continue with your breathing exercises daily.  Diet: Low fat, Low salt diet  Wound Care: May shower.  Clean wounds with mild soap and water daily. Contact the office at [phone] if any problems arise.

## 2015-08-13 NOTE — Progress Notes (Signed)
Notified by cardiac rehab that SPB in 70's before walk.  Pt reports feeling "dizzy."  Assisted back to bed, PA notified.  No new orders.  BP lying is 86/61.  Call bell in hand.  Therapy held at this time, will hold BP meds this morning and monitor closely.

## 2015-08-13 NOTE — Discharge Summary (Addendum)
[address]       [place] [address]             [phone]              Discharge Summary  Name: Ryan Cole DOB: Feb 15, 1956 59 y.o. MRN: [id number]   Admission Date: 08/02/2015 Discharge Date: 08/16/2015    Admitting Diagnosis: Chest pain Type 1 thoracic aortic dissection Severe aortic insuffiency Hypertension    Discharge Diagnosis:  Type 1 thoracic aortic dissection Severe aortic insufficiency Hypertension Postoperative thrombocytopenia Expected postoperative blood loss anemia Postoperative atrial fibrillation/atrial flutter Acute systolic heart failure Postoperative urinary retention  Past Medical History  Diagnosis Date  . 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 HYPERLIPIDEMIA 06/08/2007  . HYPOGONADISM, MALE 10/10/2007  . INSOMNIA-SLEEP DISORDER-UNSPEC 10/10/2007  . LOW BACK PAIN 06/08/2007  . WEAKNESS, LEFT SIDE OF BODY 10/10/2007  . Hypertension     Procedures: EMERGENCY THORACIC ASCENDING ANEURYSM REPAIR - 08/02/2015  Deep Hypothermic circulatory arrest  Total aortic arch and aortic root replacement  Resuspension of the native aortic valve  Reimplantation of left and right coronary arteries  Left Heart Cath and Coronary Angiography - 08/11/2015  DCCV by Dr. Jens Som on 08/14/2015   HPI:  The patient is a 59 y.o. male with a past medical history significant for hypertension, hyperlipidemia, CVA in 2008, and ulcerative colitis with a recent flare. He presented to the ER at [facility] on the date of admission complaining of chest pain. He had an episode about 10 days ago of chest tightness radiating to his neck and abdomen. On the date of admission, he had sudden onset of substernal chest tightness which radiated to his neck, head, arms and stomach. He says this was about 9/10. His pain had eased off a little after arrival to the ER. His EKG showed some borderline lateral T wave changes.  Troponin was negative. D-dimer was elevated. A CT performed to rule out PE showed a 4.6 cm ascending aneurysm, possibly as large as 5.2 cm in the root. A transthoracic echocardiogram was performed which revealed a type I aortic dissection with severe aortic insufficiency, no pericardial effusion and preserved LV function.  TCTS was consulted and Dr. Dorris Fetch saw the patient.  It was felt that he would require emergent operative repair. All risks, benefits and alternatives of surgery were explained in detail, and the patient agreed to proceed.    Hospital Course:  The patient was admitted to [facility] on 08/02/2015. The patient was taken to the operating room and underwent the above procedure.    Postoperatively, the patient was coagulopathic with high chest tube output and was treated with blood products and additional protamine. This did improve and chest tube drainage decreased.  He was kept intubated and on pressors over the course of postop day 1, but remained hemodynamically stable and was able to be extubated later that day. He developed transient atrial fibrillation and was started on po Amiodarone. Blood pressures remained low normal but stable and pressors were weaned and discontinued as tolerated. He had some initial psychomotor slowing, but this did improve over time. The patient developed urinary retention after removal of his Foley catheter, and after several unsuccessful attempts to void requiring in and out catheterization, his Foley was replaced and he was started on Flomax. He was transferred from the ICU to stepdown on postop day 6.  He then developed atrial flutter with heart rates in  the 110s.  Cardiology was consulted and his Amiodarone dose was increased. He was also started on Metoprolol. EKG was concerning for acute STEMI, and troponin was mildly elevated. A repeat echo was performed which showed new LV dysfunction with regional wall motion abnormalities.  It was felt that he  should undergo cardiac catheterization to delineate his coronary anatomy, and this was performed on 10/25. This showed EF 35-45% with mild to moderate LV dysfunction and no significant CAD.    The patient has continued to have issues with voiding.  His Foley was removed again for a voiding trial on 10/25, and he continued to have urinary retention requiring in and out catheterization.  His Foley was replaced once again on 08/13/2015. Urology was consulted and Dr. Vernie Ammons recommended continuing his Foley with outpatient follow up for removal.   Overall, the patient is progressing well. He is ambulating in the halls without difficulty.  Incisions are healing well. He is tolerating a regular diet and is having normal bowel function.  He has been restarted on steroids for ulcerative colitis and these are being slowly tapered. He remains volume overloaded, but is responding well to aggressive diuresis with Lasix. The patient remains in atrial flutter with rates in the 100-110s, and Eliquis has been started for anticoagulation.  He underwent DCCV today and converted to sinus rhythm. He has remained in sinus rhythm. Medications were adjusted accordingly and he is now on Lopressor 25 mg bid, Eliquis 5 mg bid, and Amiodarone 200 mg bid. Lisinopril was stopped because of labile blood pressure. He is felt surgically stable for discharge today.  Recent vital signs:  Filed Vitals:   08/16/15 1015  BP: 92/58  Pulse: 67  Temp: 98.3 (36.8)  Resp: 16    Recent laboratory studies:  CBC: No results for input(s): WBC, HGB, HCT, PLT in the last 72 hours. BMET:  No results for input(s): NA, K, CL, CO2, GLUCOSE, BUN, CREATININE, CALCIUM in the last 72 hours.  PT/INR: No results for input(s): LABPROT, INR in the last 72 hours.   Discharge Medications:     Medication List    STOP taking these medications        aspirin 325 MG tablet  Replaced by:  aspirin 81 MG EC tablet     lisinopril 10 MG tablet    Commonly known as:  PRINIVIL,ZESTRIL     Magnesium 250 MG Tabs     OVER THE COUNTER MEDICATION     Potassium 99 MG Tabs      TAKE these medications        acetaminophen 500 MG tablet  Commonly known as:  TYLENOL  Take 2 tablets (1,000 mg total) by mouth every 6 (six) hours as needed for mild pain.     amiodarone 200 MG tablet  Commonly known as:  PACERONE  Take 1 tablet (200 mg total) by mouth 2 (two) times daily. For one week;then take Amiodarone 200 mg daily by mouth thereafter     apixaban 5 MG Tabs tablet  Commonly known as:  ELIQUIS  Take 1 tablet (5 mg total) by mouth 2 (two) times daily.     aspirin 81 MG EC tablet  Take 1 tablet (81 mg total) by mouth daily.     atorvastatin 80 MG tablet  Commonly known as:  LIPITOR  Take 1 tablet (80 mg total) by mouth daily at 6 PM.     ferrous sulfate 325 (65 FE) MG EC tablet  Take 325 mg  by mouth daily with breakfast.     Melatonin 3 MG Tabs  Take 6-9 mg by mouth at bedtime as needed (FOR SLEEP).     mesalamine 0.375 G 24 hr capsule  Commonly known as:  APRISO  Take 1,500 mg by mouth daily.     metoprolol tartrate 25 MG tablet  Commonly known as:  LOPRESSOR  Take 1 tablet (25 mg total) by mouth 2 (two) times daily.     multivitamin with minerals Tabs tablet  Take 1 tablet by mouth daily.     oxyCODONE 5 MG immediate release tablet  Commonly known as:  Oxy IR/ROXICODONE  Take 1-2 tablets (5-10 mg total) by mouth every 4 (four) hours as needed for severe pain.     predniSONE 5 MG tablet  Commonly known as:  DELTASONE  Take 2 tablets (10 mg total) by mouth daily with breakfast. For 5 days; then take Prednisone 5 mg by mouth daily 4 days;then stop     tamsulosin 0.4 MG Caps capsule  Commonly known as:  FLOMAX  Take 1 capsule (0.4 mg total) by mouth daily after breakfast.       The patient has been discharged on:   1.Beta Blocker:  Yes [  x ]                              No   [   ]                               If No, reason:  2.Ace Inhibitor/ARB: Yes [  ]                                     No  [   x ]                                     If No, reason: Labile BP  3.Statin:   Yes [ x  ]                  No  [   ]                  If No, reason:  4.Ecasa:  Yes  x[   ]                  No   [   ]                  If No, reason:  Discharge Instructions:  The patient is to refrain from driving, heavy lifting or strenuous activity.  May shower daily and clean incisions with soap and water.  May resume regular diet.   Follow Up:  Discharge Instructions    Amb Referral to Cardiac Rehabilitation    Complete by:  As directed   Diagnosis:  Myocardial Infarction           Follow-up Information    Follow up with Peter SwazilandJordan, MD.   Specialty:  Cardiology   Why:  Call for follow up for 2 weeks   Contact information:   [address] [place] [address] [phone]  Call Garnett Farm, MD.   Specialty:  Urology   Why:  For an appointment in 1 week when you get home for appointment to get the catheter removed.   Contact information:   [address] [place] [address] [phone]       Follow up with Loreli Slot, MD On 09/15/2015.   Specialty:  Cardiothoracic Surgery   Why:  PA/LAT CXR to be taken (at [facility] which is in the same building as Dr. Sunday Corn office) on 09/15/2015 at 10:45 am;Appointment time is at 11:30 am   Contact information:   [address] [place] [address] [phone]        Ardelle Balls PA-C 08/16/2015, 10:56 AM

## 2015-08-14 ENCOUNTER — Encounter (HOSPITAL_COMMUNITY)
Admission: EM | Disposition: A | Discharge status: Home / Self Care | Payer: Self-pay | Source: Home / Self Care | Attending: Thoracic Surgery (Cardiothoracic Vascular Surgery)

## 2015-08-14 ENCOUNTER — Encounter (HOSPITAL_COMMUNITY): Payer: Self-pay

## 2015-08-14 ENCOUNTER — Inpatient Hospital Stay (HOSPITAL_COMMUNITY): Payer: Non-veteran care | Admitting: Anesthesiology

## 2015-08-14 ENCOUNTER — Inpatient Hospital Stay (HOSPITAL_COMMUNITY): Payer: Non-veteran care

## 2015-08-14 DIAGNOSIS — I7101 Dissection of thoracic aorta: Secondary | ICD-10-CM | POA: Diagnosis not present

## 2015-08-14 DIAGNOSIS — D65 Disseminated intravascular coagulation [defibrination syndrome]: Secondary | ICD-10-CM | POA: Diagnosis not present

## 2015-08-14 DIAGNOSIS — I480 Paroxysmal atrial fibrillation: Secondary | ICD-10-CM | POA: Diagnosis not present

## 2015-08-14 DIAGNOSIS — I4892 Unspecified atrial flutter: Secondary | ICD-10-CM | POA: Diagnosis not present

## 2015-08-14 DIAGNOSIS — I5021 Acute systolic (congestive) heart failure: Secondary | ICD-10-CM | POA: Diagnosis not present

## 2015-08-14 DIAGNOSIS — R7989 Other specified abnormal findings of blood chemistry: Secondary | ICD-10-CM | POA: Diagnosis not present

## 2015-08-14 DIAGNOSIS — I214 Non-ST elevation (NSTEMI) myocardial infarction: Secondary | ICD-10-CM | POA: Diagnosis not present

## 2015-08-14 DIAGNOSIS — I313 Pericardial effusion (noninflammatory): Secondary | ICD-10-CM | POA: Diagnosis not present

## 2015-08-14 HISTORY — PX: CARDIOVERSION: SHX1299

## 2015-08-14 HISTORY — PX: TEE WITHOUT CARDIOVERSION: SHX5443

## 2015-08-14 SURGERY — CARDIOVERSION
Anesthesia: Monitor Anesthesia Care

## 2015-08-14 MED ORDER — PROPOFOL 10 MG/ML IV BOLUS
INTRAVENOUS | Status: DC | PRN
  Administered 2015-08-14: 40 mg via INTRAVENOUS

## 2015-08-14 MED ORDER — PHENYLEPHRINE HCL 10 MG/ML IJ SOLN
INTRAMUSCULAR | Status: DC | PRN
  Administered 2015-08-14: 120 ug via INTRAVENOUS
  Administered 2015-08-14 (×2): 80 ug via INTRAVENOUS
  Administered 2015-08-14: 200 ug via INTRAVENOUS
  Administered 2015-08-14: 120 ug via INTRAVENOUS

## 2015-08-14 MED ORDER — PROPOFOL 500 MG/50ML IV EMUL
INTRAVENOUS | Status: DC | PRN
  Administered 2015-08-14: 100 ug/kg/min via INTRAVENOUS

## 2015-08-14 MED ORDER — BUTAMBEN-TETRACAINE-BENZOCAINE 2-2-14 % EX AERO
INHALATION_SPRAY | CUTANEOUS | Status: DC | PRN
  Administered 2015-08-14: 2 via TOPICAL

## 2015-08-14 NOTE — Anesthesia Postprocedure Evaluation (Signed)
  Anesthesia Post-op Note  Patient: Ryan MorinDaryl V Cole  Procedure(s) Performed: Procedure(s) (LRB): CARDIOVERSION (N/A) TRANSESOPHAGEAL ECHOCARDIOGRAM (TEE) (N/A)  Patient Location: PACU  Anesthesia Type: MAC  Level of Consciousness: awake and alert   Airway and Oxygen Therapy: Patient Spontanous Breathing  Post-op Pain: mild  Post-op Assessment: Post-op Vital signs reviewed, Patient's Cardiovascular Status Stable, Respiratory Function Stable, Patent Airway and No signs of Nausea or vomiting  Last Vitals:  Filed Vitals:   08/14/15 1335  BP: 108/60  Pulse: 77  Temp:   Resp: 15    Post-op Vital Signs: stable   Complications: No apparent anesthesia complications

## 2015-08-14 NOTE — Progress Notes (Signed)
Patient Name: Ryan MorinDaryl V Legner Date of Encounter: 08/14/2015  Primary Cardiologist: new   Active Problems:   Thoracic aortic dissection (HCC)   HTN (hypertension)   Ulcerative colitis (HCC)   Atrial fibrillation (HCC)   Abnormal echocardiogram   Elevated troponin    SUBJECTIVE  The patient underwent TEE/DCCV today and feels significantly better already.  CURRENT MEDS . amiodarone  400 mg Oral BID  . apixaban  5 mg Oral BID  . aspirin EC  81 mg Oral Daily  . atorvastatin  80 mg Oral q1800  . furosemide  40 mg Intravenous BID  . lisinopril  2.5 mg Oral Daily  . mesalamine  1.5 g Oral Daily  . metoprolol tartrate  50 mg Oral BID  . pantoprazole  40 mg Oral QAC breakfast  . potassium chloride  20 mEq Oral Daily  . predniSONE  20 mg Oral Q breakfast  . sodium chloride  3 mL Intravenous Q12H  . tamsulosin  0.4 mg Oral QPC breakfast    OBJECTIVE  Filed Vitals:   08/14/15 1320 08/14/15 1325 08/14/15 1330 08/14/15 1335  BP:  106/60  108/60  Pulse: 80 77 79 77  Temp:      TempSrc:      Resp: 17 12 13 15   Height:      Weight:      SpO2: 99% 100% 100% 98%    Intake/Output Summary (Last 24 hours) at 08/14/15 1512 Last data filed at 08/14/15 1400  Gross per 24 hour  Intake    760 ml  Output   3600 ml  Net  -2840 ml   Filed Weights   08/13/15 0500 08/14/15 0422 08/14/15 1109  Weight: 204 lb 12.9 oz (92.9 kg) 201 lb 9.6 oz (91.445 kg) 201 lb (91.173 kg)    PHYSICAL EXAM  General: Pleasant, NAD. Neuro: Alert and oriented X 3. Moves all extremities spontaneously. Psych: Normal affect. HEENT:  Normal  Neck: Supple without bruits or JVD. Lungs:  Resp regular and unlabored. CTA, no rale, markedly decreased breath sound in bilateral bases, worse on the left Heart: tachycardia. no s3, s4, or murmurs. Abdomen: Soft, non-tender, non-distended, BS + x 4.  Extremities: No clubbing, cyanosis. DP/PT/Radials 2+ and equal bilaterally. 1+ bilateral pitting edema   Accessory  Clinical Findings  CBC  Recent Labs  08/13/15 0240  WBC 11.5*  HGB 8.6*  HCT 26.1*  MCV 92.2  PLT 331   Basic Metabolic Panel  Recent Labs  08/12/15 1520 08/13/15 0240  NA 139 137  K 4.3 4.1  CL 102 104  CO2 24 24  GLUCOSE 102* 110*  BUN 19 19  CREATININE 1.05 1.01  CALCIUM 9.4 9.0    TELE 2:1 aflutter with HR 110s    ECG  2:1 atrial flutter with RVR  Echocardiogram 08/10/2015  LV EF: 35%  ------------------------------------------------------------------- Indications:   Atrial fibrillation - 427.31.  ------------------------------------------------------------------- History:  PMH: S/P thoracic ascending aneurysm repair. Ulcerative colitis. Stroke. Risk factors: Hypertension. Dyslipidemia.  ------------------------------------------------------------------- Study Conclusions  - Left ventricle: The cavity size was normal. There was moderate concentric hypertrophy. Systolic function was moderately to severely reduced. The estimated ejection fraction was 35%. There is akinesis of the entire anterior, basal and mid anteroseptal, inferoseptal and apical septal walls. The study was not technically sufficient to allow evaluation of LV diastolic dysfunction due to atrial flutter. - Ventricular septum: Septal motion showed paradox. - Aortic valve: Trileaflet; normal thickness leaflets. There was mild regurgitation. - Aortic  root: The aortic root was normal in size. - Ascending aorta: S/P ascending aneurysm repair, normal size. - Mitral valve: Structurally normal valve. - Left atrium: The atrium was mildly dilated. - Right ventricle: Systolic function was mildly reduced. - Right atrium: The atrium was mildly dilated. - Tricuspid valve: There was mild regurgitation. - Pulmonic valve: There was trivial regurgitation. - Pericardium, extracardiac: A small circumferential pericardial effusion was identified with no signs of  tamponade.  Impressions:  - Compared to the prior study on 08/04/2015 there are now new wall motion abnormalities in the LAD territory and impaired LVEF now 35%, previoulsy 50-55%.    Radiology/Studies  Dg Chest 2 View  08/09/2015  CLINICAL DATA:  Post thoracic aortic aneurysm repair 08/02/2015 EXAM: CHEST  2 VIEW COMPARISON:  08/07/2015 FINDINGS: Enlargement of cardiac silhouette post median sternotomy. Epicardial pacing wires noted. Stable mediastinal contours and pulmonary vascularity. Bibasilar effusions and atelectasis greater on LEFT little changed. No acute infiltrate or pneumothorax. Bones unremarkable. IMPRESSION: Persistent bibasilar at atelectasis and pleural effusions greater on LEFT, unchanged. Enlargement of cardiac silhouette. Electronically Signed   By: Ulyses Southward M.D.   On: 08/09/2015 09:12     ASSESSMENT AND PLAN  59 yo male presented with type A aortic dissection associated with severe AI, underwent emergent surgery on 08/02/15 with total aortic arch and root replacement, resuspension of native AV, and reimplantation of coronary arteries. Went into afib on 10/20, IV amiodarone started. Now in aflutter.   1. Post op paroxysmal atrial fibrillation/atrial flutter  - echo 08/10/2015 EF 35%, akinesis of entire anterior, basal, and mid anteroseptal, inferoseptal and apical septal wall, paradoxical ventricular motion, mild AR, s/p ascending aneurysm repair, small pericardial effusion.  - CHAD-vasc score of 4, started on eliquis  - s/p a successful DCCV today   2. 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 Acute systolic HF - most probably sec to a-flutter with RVR, repeat echo in 6 weeks.  - still fluid overloaded, continue IV lasix. No rale on exam, however has markedly decreased breath sound bilateral, 1/3 way up on L side, only minimal on right side, consistent with pleural effusion seen on earlier CXR  - expecting improvement now that he is in SR  4.  Elevated trop: trop 1.69 -->1.34 --> 1.06  - given new wall motion abnormalities, cath 08/11/2015 no significant CAD  5. Recent GI bleeding from UC  6. HTN 7. Remote TIA   Lars Masson 08/14/2015

## 2015-08-14 NOTE — H&P (View-Only) (Signed)
       [address]       [place] [id number]             [phone]          3 Days Post-Op Procedure(s) (LRB): Left Heart Cath and Coronary Angiography (N/A)  Subjective: Still feels weak this am.     Objective: Vital signs in last 24 hours: Patient Vitals for the past 24 hrs:  BP Temp Temp src Pulse Resp SpO2 Weight  08/14/15 0422 - - - - - - 201 lb 9.6 oz (91.445 kg)  08/14/15 0415 117/79 mmHg 98.3 F (36.8 C) Oral (!) 109 18 100 % -  08/13/15 1952 (!) 90/57 mmHg 100 F (37.8 C) Oral (!) 110 18 100 % -  08/13/15 1323 (!) 89/57 mmHg 98.3 F (36.8 C) Oral (!) 109 18 100 % -  08/13/15 1004 (!) 89/64 mmHg - - - - - -  08/13/15 0900 (!) 86/61 mmHg - - - - - -  08/13/15 0850 (!) 79/55 mmHg - - - - - -   Current Weight  08/14/15 201 lb 9.6 oz (91.445 kg)  PRE-OPERATIVE WEIGHT: 88 kg   Intake/Output from previous day: 10/27 0701 - 10/28 0700 In: 1080 [P.O.:1080] Out: 1800 [Urine:1800]    PHYSICAL EXAM:  Heart: RRR Lungs: Clear Wound: Clean and dry Extremities: +LE edema    Lab Results: CBC: Recent Labs  08/13/15 0240  WBC 11.5*  HGB 8.6*  HCT 26.1*  PLT 331   BMET:  Recent Labs  08/12/15 1520 08/13/15 0240  NA 139 137  K 4.3 4.1  CL 102 104  CO2 24 24  GLUCOSE 102* 110*  BUN 19 19  CREATININE 1.05 1.01  CALCIUM 9.4 9.0    PT/INR: No results for input(s): LABPROT, INR in the last 72 hours.    Assessment/Plan: S/P Procedure(s) (LRB): Left Heart Cath and Coronary Angiography (N/A)  CV- Persistent Aflutter, rates unchanged 100-110s. Remains hypotensive. Continue Amio, Lopressor, Eliquis. For likely TEE cardioversion today.  Expected postop blood loss anemia- H/H generally stable.  Postop urinary retention- Continue Flomax. Keep Foley in and urology will d/c as outpatient.  H/o ulcerative colitis- Continue steroid taper.   Vol overload- Continue diuresis.  Deconditioning- Continue CRPI/PT.  Disp- hopefully home  soon once rhythm issues resolved.   LOS: 11 days    COLLINS,GINA H 08/14/2015  Patient seen and examined, agree with above He is up in chair. Bp a little better but far below what he is used to seeing. Will benefit form DCCV  Viviann SpareSteven C. Dorris FetchHendrickson, MD [facility] [phone]

## 2015-08-14 NOTE — Transfer of Care (Signed)
Immediate Anesthesia Transfer of Care Note  Patient: Ryan MorinDaryl V Cole  Procedure(s) Performed: Procedure(s): CARDIOVERSION (N/A) TRANSESOPHAGEAL ECHOCARDIOGRAM (TEE) (N/A)  Patient Location: Endoscopy Unit  Anesthesia Type:MAC  Level of Consciousness: awake, alert  and oriented  Airway & Oxygen Therapy: Patient Spontanous Breathing and Patient connected to nasal cannula oxygen  Post-op Assessment: Report given to RN, Post -op Vital signs reviewed and stable and Patient moving all extremities  Post vital signs: Reviewed and stable  Last Vitals:  Filed Vitals:   08/14/15 1245  BP: 70/48  Pulse: 79  Temp: 36.4 C  Resp: 14    Complications: No apparent anesthesia complications BP decreased 80/50- pt denies pain. A&O and moving all extremities. Given phenylephrine bolus and NS infusing- Dr. Gentry RochJudd notified.

## 2015-08-14 NOTE — Progress Notes (Signed)
Pt arrived back on unit. Call bell and phone within reach. Will continue to monitor.

## 2015-08-14 NOTE — CV Procedure (Signed)
See full TEE report in [employer]; patient sedated by anesthesia with diprovan 150 mg IV total; no LAA thrombus; patient subsequently had DCCV with 120 J to sinus rhythm; no immediate complications; continue apixaban. Olga MillersBrian Abree Romick

## 2015-08-14 NOTE — Progress Notes (Addendum)
       [address]       [place] [address]             [phone]          3 Days Post-Op Procedure(s) (LRB): Left Heart Cath and Coronary Angiography (N/A)  Subjective: Still feels weak this am.     Objective: Vital signs in last 24 hours: Patient Vitals for the past 24 hrs:  BP Temp Temp src Pulse Resp SpO2 Weight  08/14/15 0422 - - - - - - 201 lb 9.6 oz (91.445 kg)  08/14/15 0415 117/79 mmHg 98.3 F (36.8 C) Oral (!) 109 18 100 % -  08/13/15 1952 (!) 90/57 mmHg 100 F (37.8 C) Oral (!) 110 18 100 % -  08/13/15 1323 (!) 89/57 mmHg 98.3 F (36.8 C) Oral (!) 109 18 100 % -  08/13/15 1004 (!) 89/64 mmHg - - - - - -  08/13/15 0900 (!) 86/61 mmHg - - - - - -  08/13/15 0850 (!) 79/55 mmHg - - - - - -   Current Weight  08/14/15 201 lb 9.6 oz (91.445 kg)  PRE-OPERATIVE WEIGHT: 88 kg   Intake/Output from previous day: 10/27 0701 - 10/28 0700 In: 1080 [P.O.:1080] Out: 1800 [Urine:1800]    PHYSICAL EXAM:  Heart: RRR Lungs: Clear Wound: Clean and dry Extremities: +LE edema    Lab Results: CBC: Recent Labs  08/13/15 0240  WBC 11.5*  HGB 8.6*  HCT 26.1*  PLT 331   BMET:  Recent Labs  08/12/15 1520 08/13/15 0240  NA 139 137  K 4.3 4.1  CL 102 104  CO2 24 24  GLUCOSE 102* 110*  BUN 19 19  CREATININE 1.05 1.01  CALCIUM 9.4 9.0    PT/INR: No results for input(s): LABPROT, INR in the last 72 hours.    Assessment/Plan: S/P Procedure(s) (LRB): Left Heart Cath and Coronary Angiography (N/A)  CV- Persistent Aflutter, rates unchanged 100-110s. Remains hypotensive. Continue Amio, Lopressor, Eliquis. For likely TEE cardioversion today.  Expected postop blood loss anemia- H/H generally stable.  Postop urinary retention- Continue Flomax. Keep Foley in and urology will d/c as outpatient.  H/o ulcerative colitis- Continue steroid taper.   Vol overload- Continue diuresis.  Deconditioning- Continue CRPI/PT.  Disp- hopefully home  soon once rhythm issues resolved.   LOS: 11 days    COLLINS,GINA H 08/14/2015  Patient seen and examined, agree with above He is up in chair. Bp a little better but far below what he is used to seeing. Will benefit form DCCV  Emmali Karow C. Lawrance Wiedemann, MD [facility] [phone]  

## 2015-08-14 NOTE — Interval H&P Note (Signed)
History and Physical Interval Note:  08/14/2015 12:03 PM  Ryan Cole  has presented today for surgery, with the diagnosis of a fib  The various methods of treatment have been discussed with the patient and family. After consideration of risks, benefits and other options for treatment, the patient has consented to  Procedure(s): CARDIOVERSION (N/A) TRANSESOPHAGEAL ECHOCARDIOGRAM (TEE) (N/A) as a surgical intervention .  The patient's history has been reviewed, patient examined, no change in status, stable for surgery.  I have reviewed the patient's chart and labs.  Questions were answered to the patient's satisfaction.     Olga MillersBrian Bradon Fester

## 2015-08-14 NOTE — Progress Notes (Signed)
  Echocardiogram Echocardiogram Transesophageal has been performed.  Ryan Cole, Ryan Cole 08/14/2015, 12:53 PM

## 2015-08-14 NOTE — Care Management Note (Signed)
Case Management Note CM note started by Avie ArenasSarah Brown RNCM  Patient Details  Name: Ryan Cole MRN: [id number] Date of Birth: 06/02/1956  Subjective/Objective:       [facility] - left message for call back              Action/Plan:   Expected Discharge Date:                Expected Discharge Plan:  Home/Self Care  In-House Referral:     Discharge planning Services  CM Consult, Medication Assistance  Post Acute Care Choice:    Choice offered to:     DME Arranged:    DME Agency:     HH Arranged:    HH Agency:     Status of Service:  In process, will continue to follow  Medicare Important Message Given:    Date Medicare IM Given:    Medicare IM give by:    Date Additional Medicare IM Given:    Additional Medicare Important Message give by:     If discussed at Long Length of Stay Meetings, dates discussed:  08/11/15, 08/13/15  Additional Comments:  10/28-28/16- Donn PieriniKristi Jenin Birdsall RN, BSN [phone] Referral for Eliquis- pt has been given 30 day free card for Eliquis- pt [facility]- and he will need approval from his VA PCP to continue on Eliquis and be provided from [facility]- have spoken with Selinda MichaelsLinda Hunt at the [facility] regarding pt's discharge needs- [phone]- per conversation with Bonita QuinLinda pt will need his medications approved by the [facility]- in the meantime- pt [facility]- [facility] and take his d/c prescriptions with him to be filled by the pharmacy there with a 7 day bridge until his PCP at the [facility] approves the medications for a 30 day  fill. Pt has been instructed on this and understands that he will need to go [facility]. CM has faxed a current med list to Gra[place]Linda at the [facility]- for review and in order to send on the pt's PCP for a preliminary review of possible d/c meds. Pt may also need a HH-RN for foley cath care/education this was also expressed to Thr[place]Linda at  the [facility] - but would also need to be approved by PCP- if pt is discharged over weekend will fax final d/c summary with med list to [place] on Monday 08/17/15.  Pt will need paper scripts to [facility] with [facility] to [facility].   08/10/15- Donn PieriniKristi Trana Ressler RN, BSN - spoke with pt and wife at bedside- this am- per conversation pt plans to return home with wife and family to assist- wife states that her sisters are planning on coming in to assist them upon discharge as she has to return to work however between herself and the 2 sister-n-laws pt will have 24/7 assistance upon discharge. They are interested in possible HH and DME (RW and 3n1) if needed and are awaiting PT to see pt again today. Spoke with pt and wife again later this afternoon following PT therapy and there are no f/u recommendations now being made by PT based on pt's progress- At this time pt and wife are in agreement that they will not need HH at this time for PT and do not feel like any DME will be needed. CM will continue to follow for d/c need- pt  does get [facility] at the [facility]- and CM will assess for new medications and make contact with the [facility] regarding any new medications needed and fax needed info to the [place] prior to discharge.   Darrold Span, RN 08/14/2015, 2:26 PM Case Management Note  Patient Details  Name: Ryan Cole MRN: [id number] Date of Birth: 1955/11/27  Subjective/Objective:                    Action/Plan:   Expected Discharge Date:  08/10/15               Expected Discharge Plan:  Home/Self Care  In-House Referral:     Discharge planning Services  CM Consult, Medication Assistance  Post Acute Care Choice:    Choice offered to:     DME Arranged:    DME Agency:     HH Arranged:    HH Agency:     Status of Service:  In process, will continue to follow  Medicare Important Message Given:    Date Medicare IM Given:    Medicare IM give by:    Date Additional Medicare IM Given:    Additional  Medicare Important Message give by:     If discussed at Long Length of Stay Meetings, dates discussed:    Additional Comments:  Darrold Span, RN 08/14/2015, 2:26 PM

## 2015-08-14 NOTE — Anesthesia Preprocedure Evaluation (Addendum)
Anesthesia Evaluation  Patient identified by MRN, date of birth, ID band Patient awake    Reviewed: Allergy & Precautions, NPO status , Patient's Chart, lab work & pertinent test results  History of Anesthesia Complications Negative for: history of anesthetic complications  Airway Mallampati: II  TM Distance: >3 FB Neck ROM: Full    Dental no notable dental hx. (+) Dental Advisory Given   Pulmonary neg pulmonary ROS,    Pulmonary exam normal breath sounds clear to auscultation       Cardiovascular hypertension, Pt. on medications + Peripheral Vascular Disease and +CHF  Normal cardiovascular exam Rhythm:Irregular Rate:Normal  Compared to the prior study on 08/04/2015 there are now new wall motion abnormalities in the LAD territory and impaired LVEF now 35%, previoulsy 50-55%.    Neuro/Psych PSYCHIATRIC DISORDERS negative neurological ROS     GI/Hepatic Neg liver ROS, PUD,   Endo/Other  negative endocrine ROS  Renal/GU negative Renal ROS  negative genitourinary   Musculoskeletal negative musculoskeletal ROS (+)   Abdominal   Peds negative pediatric ROS (+)  Hematology negative hematology ROS (+)   Anesthesia Other Findings   Reproductive/Obstetrics negative OB ROS                           Anesthesia Physical Anesthesia Plan  ASA: III  Anesthesia Plan: MAC   Post-op Pain Management:    Induction: Intravenous  Airway Management Planned: Nasal Cannula  Additional Equipment:   Intra-op Plan:   Post-operative Plan:   Informed Consent: I have reviewed the patients History and Physical, chart, labs and discussed the procedure including the risks, benefits and alternatives for the proposed anesthesia with the patient or authorized representative who has indicated his/her understanding and acceptance.   Dental advisory given  Plan Discussed with: CRNA  Anesthesia Plan  Comments:         Anesthesia Quick Evaluation

## 2015-08-15 DIAGNOSIS — I10 Essential (primary) hypertension: Secondary | ICD-10-CM | POA: Diagnosis not present

## 2015-08-15 DIAGNOSIS — I480 Paroxysmal atrial fibrillation: Secondary | ICD-10-CM | POA: Diagnosis not present

## 2015-08-15 MED ORDER — FUROSEMIDE 10 MG/ML IJ SOLN
40.0000 mg | Freq: Once | INTRAMUSCULAR | Status: AC
  Administered 2015-08-15: 40 mg via INTRAVENOUS
  Filled 2015-08-15: qty 4

## 2015-08-15 MED ORDER — METOPROLOL TARTRATE 25 MG PO TABS
25.0000 mg | ORAL_TABLET | Freq: Two times a day (BID) | ORAL | Status: DC
  Administered 2015-08-15 (×2): 25 mg via ORAL
  Filled 2015-08-15 (×3): qty 1

## 2015-08-15 NOTE — Progress Notes (Signed)
SUBJECTIVE:  No complaints except fatigue  OBJECTIVE:   Vitals:   Filed Vitals:   08/14/15 1335 08/14/15 2053 08/15/15 0437 08/15/15 0441  BP: 108/60 101/52 109/61   Pulse: 77 97 87   Temp:  98.6 F (37 C) 98.7 F (37.1 C)   TempSrc:  Oral Oral   Resp: 15 16 18    Height:      Weight:    196 lb 13.9 oz (89.3 kg)  SpO2: 98% 95% 99%    I&O's:   Intake/Output Summary (Last 24 hours) at 08/15/15 1006 Last data filed at 08/14/15 1915  Gross per 24 hour  Intake    620 ml  Output   3300 ml  Net  -2680 ml   TELEMETRY: Reviewed telemetry pt in NSR:     PHYSICAL EXAM General: Well developed, well nourished, in no acute distress Head: Eyes PERRLA, No xanthomas.   Normal cephalic and atramatic  Lungs:   Clear bilaterally to auscultation and percussion. Heart:   HRRR S1 S2 Pulses are 2+ & equal. Abdomen: Bowel sounds are positive, abdomen soft and non-tender without masses Extremities:   No clubbing, cyanosis or edema.  DP +1 Neuro: Alert and oriented X 3. Psych:  Good affect, responds appropriately   LABS: Basic Metabolic Panel:  Recent Labs  45/40/9810/26/16 1520 08/13/15 0240  NA 139 137  K 4.3 4.1  CL 102 104  CO2 24 24  GLUCOSE 102* 110*  BUN 19 19  CREATININE 1.05 1.01  CALCIUM 9.4 9.0   Liver Function Tests: No results for input(s): AST, ALT, ALKPHOS, BILITOT, PROT, ALBUMIN in the last 72 hours. No results for input(s): LIPASE, AMYLASE in the last 72 hours. CBC:  Recent Labs  08/13/15 0240  WBC 11.5*  HGB 8.6*  HCT 26.1*  MCV 92.2  PLT 331   Cardiac Enzymes: No results for input(s): CKTOTAL, CKMB, CKMBINDEX, TROPONINI in the last 72 hours. BNP: Invalid input(s): POCBNP D-Dimer: No results for input(s): DDIMER in the last 72 hours. Hemoglobin A1C: No results for input(s): HGBA1C in the last 72 hours. Fasting Lipid Panel: No results for input(s): CHOL, HDL, LDLCALC, TRIG, CHOLHDL, LDLDIRECT in the last 72 hours. Thyroid Function Tests: No results  for input(s): TSH, T4TOTAL, T3FREE, THYROIDAB in the last 72 hours.  Invalid input(s): FREET3 Anemia Panel: No results for input(s): VITAMINB12, FOLATE, FERRITIN, TIBC, IRON, RETICCTPCT in the last 72 hours. Coag Panel:   Lab Results  Component Value Date   INR 1.18 08/10/2015   INR 0.91 08/03/2015   INR 1.78* 08/03/2015    RADIOLOGY: Dg Chest 2 View  08/09/2015  CLINICAL DATA:  Post thoracic aortic aneurysm repair 08/02/2015 EXAM: CHEST  2 VIEW COMPARISON:  08/07/2015 FINDINGS: Enlargement of cardiac silhouette post median sternotomy. Epicardial pacing wires noted. Stable mediastinal contours and pulmonary vascularity. Bibasilar effusions and atelectasis greater on LEFT little changed. No acute infiltrate or pneumothorax. Bones unremarkable. IMPRESSION: Persistent bibasilar at atelectasis and pleural effusions greater on LEFT, unchanged. Enlargement of cardiac silhouette. Electronically Signed   By: Ulyses SouthwardMark  Boles M.D.   On: 08/09/2015 09:12   Ct Angio Chest Pe W/cm &/or Wo Cm  08/02/2015  CLINICAL DATA:  59 yr old male with chest pain radiating to left side of jaw. Diaphoretic. Had about a week ago but didn't seek medical attention. No known heart problems. EXAM: CT ANGIOGRAPHY CHEST WITH CONTRAST TECHNIQUE: Multidetector CT imaging of the chest was performed using the standard protocol during bolus administration of intravenous  contrast. Multiplanar CT image reconstructions and MIPs were obtained to evaluate the vascular anatomy. CONTRAST:  OMNIPAQUE IOHEXOL 350 MG/ML SOLN COMPARISON:  Current chest radiographs. FINDINGS: Angiographic study: No evidence of a pulmonary embolus. Aorta is not opacified. It is dilated. Ascending aorta measures 4.7 cm in diameter approximately 4 cm above the aortic valve and 5.2 cm at the aortic root. Aortic branch vessels are normal in caliber. Thoracic inlet:  No mass or adenopathy.  Thyroid unremarkable. Mediastinum and hila: Heart mildly enlarged. No  mediastinal or hilar masses or pathologically enlarged lymph nodes. Lungs and pleura: Mild dependent lower lobe subsegmental atelectasis. No evidence of pneumonia or edema. No mass or suspicious nodule. No pleural effusion or pneumothorax. Limited upper abdomen: Probable small cyst from the upper pole left kidney. Otherwise unremarkable. Musculoskeletal: Mild degenerative spurring noted along the thoracic spine. No osteoblastic or osteolytic lesions. Review of the MIP images confirms the above findings. IMPRESSION: 1. No evidence of a pulmonary embolus. 2. 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 3. No acute findings. Lungs are clear other than mild dependent subsegmental atelectasis. Electronically Signed   By: Amie Portland M.D.   On: 08/02/2015 20:42   Dg Chest Port 1 View  08/07/2015  CLINICAL DATA:  Aortic dissection . EXAM: PORTABLE CHEST 1 VIEW COMPARISON:  08/06/2015. FINDINGS: Interim removal right IJ sheath. Prior CABG. Cardiomegaly. Left lower lobe atelectasis and/or infiltrate. Small left pleural effusion unchanged. No pneumothorax. IMPRESSION: 1. Interval removal right IJ sheath. 2. Prior median sternotomy. Prior thoracic aneurysm repair. Mediastinum is stable. 3. Persistent left lower lobe atelectasis and/or infiltrate. Small left pleural effusion. No interim change . Electronically Signed   By: Maisie Fus  Register   On: 08/07/2015 07:39   Dg Chest Port 1 View  08/06/2015  CLINICAL DATA:  Aortic dissection. EXAM: PORTABLE CHEST 1 VIEW COMPARISON:  08/05/2015. FINDINGS: Right IJ sheath in stable position. Interval removal of mediastinal drainage catheters. New prior median sternotomy and thoracic aneurysm repair. Mediastinum is stable. Stable cardiomegaly. Persistent left lower lobe atelectasis and/or infiltrate. Improving right base atelectasis. Small left pleural effusion. No pneumothorax. IMPRESSION: 1. Interval removal of mediastinal drainage  catheters. Right IJ sheath in stable position. No pneumothorax. 2. Prior median sternotomy and thoracic aneurysm repair. Mediastinum is stable. Stable cardiomegaly. 3. Persistent left lower lobe atelectasis and/or infiltrate. Interim clearing of right base atelectasis. Small left pleural effusion . Electronically Signed   By: Maisie Fus  Register   On: 08/06/2015 07:54   Dg Chest Port 1 View  08/05/2015  CLINICAL DATA:  Status post repair of type 1 aortic dissection. EXAM: PORTABLE CHEST 1 VIEW COMPARISON:  08/04/2015 FINDINGS: Endotracheal tube, enteric tube, and Swan-Ganz catheter have been removed. Right jugular sheath remains in place with tip overlying the upper SVC. Mediastinal drains remain in place. Sternotomy wires are again noted. Cardiac silhouette is mildly enlarged. Lung volumes are slightly diminished compared to the prior study. Bibasilar lung opacities and pleural effusions have not significantly changed. No pneumothorax is identified. IMPRESSION: Interval extubation. Persistent bibasilar atelectasis and small pleural effusions. Electronically Signed   By: Sebastian Ache M.D.   On: 08/05/2015 07:44   Dg Chest Port 1 View  08/04/2015  CLINICAL DATA:  Thoracic ascending aortic aneurysm repair EXAM: PORTABLE CHEST 1 VIEW COMPARISON:  08/03/2015 FINDINGS: Endotracheal tube has been withdrawn and is now in good position. Swan-Ganz catheter in the main pulmonary artery. Pericardial and mediastinal drains in good position.  NG tube in place. Negative for pneumothorax Increase in bibasilar atelectasis and small pleural effusions. Negative for edema. IMPRESSION: Endotracheal tube and support lines in good position Progressive bibasilar atelectasis and small pleural effusions. Electronically Signed   By: Marlan Palau M.D.   On: 08/04/2015 08:10   Dg Chest Port 1 View  08/03/2015  CLINICAL DATA:  59 year old male with a history of type 1 aortic dissection repair EXAM: PORTABLE CHEST 1 VIEW COMPARISON:   Plain film 08/03/2015, CT 08/02/2015 FINDINGS: Early postoperative changes of type 1 aortic dissection repair. Slight right rotation the patient. There is slightly increased diameter of the upper mediastinum on the current plain film compared to the prior CT, which may be secondary to patient positioning. Unchanged position of endotracheal tube, which terminates just above the carina and is directed towards the right mainstem. At least 3 mediastinal drains/pleural drains project over the mediastinum. Gastric tube terminates within the left upper quadrant, with the side port at the gastroesophageal junction. Unchanged position of the right IJ approach sheath, through which a Swan-Ganz catheter terminates in the region of the pulmonary outflow tract. Surgical changes of median sternotomy. Lung volumes are low with ill-defined opacities at the left greater than right base. IMPRESSION: Early postoperative changes of median sternotomy and a type 1 dissection repair. The superior mediastinum is slightly widened compared to the recent plain film comparison, though this may be secondary to slight right rotation. If there is concern for acute abnormality, correlation with either cardiac echo or contrast-enhanced CT may be useful. Endotracheal tube terminates just above the carina directed towards the right mainstem. Withdrawal of 3 cm to 7 cm may be useful for better position. Unchanged position of multiple mediastinal/ pleural drains, right IJ sheath through which a Swan-Ganz catheter is transmitted, and gastric tube. Atelectasis at the left base. These results were called by telephone at the time of interpretation on 08/03/2015 at 3:42 pm to the nurse caring for the patient, Ms Louie Bun, who verbally acknowledged these results. Electronically Signed   By: Gilmer Mor D.O.   On: 08/03/2015 15:42   Dg Chest Port 1 View  08/03/2015  CLINICAL DATA:  Postop CABG evaluate for suction instrument 4" long x 0.75" wide  EXAM: PORTABLE CHEST 1 VIEW COMPARISON:  08/02/2015 FINDINGS: Swan-Ganz central venous catheter with tip in the right main pulmonary artery. Endotracheal tube with tip about 5 mm above the carina. There are two left chest tubes and a mediastinal drain. There is a nasogastric tube extending off the inferior edge of the film with the side hole over the distal esophagus. There is postoperative hazy attenuation at both lung bases likely representing atelectasis. No evidence postoperative edema or pneumothorax. There is no evidence of the described suction device. IMPRESSION: Two left chest tubes, mediastinal drain, NG tube, ET tube, and swan ganz line. Electronically Signed   By: Esperanza Heir M.D.   On: 08/03/2015 10:07   Dg Chest Port 1 View  08/02/2015  CLINICAL DATA:  Chest pain starting today EXAM: PORTABLE CHEST 1 VIEW COMPARISON:  08/25/2007 FINDINGS: The heart size and mediastinal contours are within normal limits. Both lungs are clear. The visualized skeletal structures are unremarkable. IMPRESSION: No active disease. Electronically Signed   By: Natasha Mead M.D.   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 Went into afib on 10/20, IV amiodarone started. Now in aflutter.   1. Post op paroxysmal atrial fibrillation/atrial flutter - echo 08/10/2015 EF 35%, akinesis of entire anterior, basal, and mid anteroseptal, inferoseptal and apical septal wall, paradoxical ventricular motion, mild AR, s/p ascending aneurysm repair, small pericardial effusion. - CHAD-vasc score of 4, started on eliquis - s/p a successful DCCV   - will need to remain on Eliquis   - continue Amio/BB  - keep on Amio  BID for 1 weeks then Amio  daily for 2  weeks then  daily  2. 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 Acute systolic HF - most probably sec to a-flutter with RVR, repeat echo in 6 weeks. - No rale on exam.  Change Lasix to PO. - expecting improvement now that he is in SR  - Agree with holding ACE I in setting of soft BP  4. Elevated trop: trop 1.69 -->1.34 --> 1.06 - given new wall motion abnormalities, cath 08/11/2015 no significant CAD  5. Recent GI bleeding from UC  6. HTN - controlled  7. Remote TIA    Quintella Reichert, MD  08/15/2015  10:06 AM

## 2015-08-15 NOTE — Progress Notes (Signed)
CARDIAC REHAB PHASE I   PRE:  Rate/Rhythm: 81 NSR  BP:  Supine:   Sitting: 107/67  Standing:    SaO2: 97% RA  MODE:  Ambulation: 550 ft   POST:  Rate/Rhythm: 90  BP:  Supine:   Sitting: 110/68  Standing:    SaO2: 97% RA  1235-1330 Pt tolerated ambulation well with assist x1, gait slow, fairly steady. Education completed with pt including sternal precautions, IS use, restrictions, risk factors, heart healthy diet, and activity progression. Pt receptive and verbalizes understanding of instructions given. Discussed Phase 2 cardiac rehab, and pt is interested in participation in the program. Permission given to send contact info to CR at [facility].  Artist Paislinty M Sama Arauz, MS, ACSM CCEP

## 2015-08-15 NOTE — Progress Notes (Addendum)
      [address]       [place] [id number]             [phone]        1 Day Post-Op Procedure(s) (LRB): CARDIOVERSION (N/A) TRANSESOPHAGEAL ECHOCARDIOGRAM (TEE) (N/A)  Subjective: Patient still feels a little weak  Objective: Vital signs in last 24 hours: Temp:  [97.6 F (36.4 C)-98.7 F (37.1 C)] 98.7 F (37.1 C) (10/29 0437) Pulse Rate:  [77-113] 87 (10/29 0437) Cardiac Rhythm:  [-] Normal sinus rhythm (10/29 0437) Resp:  [12-18] 18 (10/29 0437) BP: (70-119)/(48-74) 109/61 mmHg (10/29 0437) SpO2:  [95 %-100 %] 99 % (10/29 0437) Weight:  [196 lb 13.9 oz (89.3 kg)-201 lb (91.173 kg)] 196 lb 13.9 oz (89.3 kg) (10/29 0441)  Pre op weight 88 kg Current Weight  08/15/15 196 lb 13.9 oz (89.3 kg)      Intake/Output from previous day: 10/28 0701 - 10/29 0700 In: 620 [P.O.:220; I.V.:400] Out: 3300 [Urine:3300]   Physical Exam:  Cardiovascular: RRR Pulmonary: Clear to auscultation bilaterally; no rales, wheezes, or rhonchi. Abdomen: Soft, non tender, bowel sounds present. Extremities: Trace bilateral lower extremity edema. Wounds: Clean and dry.  No erythema or signs of infection.  Lab Results: CBC: Recent Labs  08/13/15 0240  WBC 11.5*  HGB 8.6*  HCT 26.1*  PLT 331   BMET:  Recent Labs  08/12/15 1520 08/13/15 0240  NA 139 137  K 4.3 4.1  CL 102 104  CO2 24 24  GLUCOSE 102* 110*  BUN 19 19  CREATININE 1.05 1.01  CALCIUM 9.4 9.0    PT/INR:  Lab Results  Component Value Date   INR 1.18 08/10/2015   INR 0.91 08/03/2015   INR 1.78* 08/03/2015   ABG:  INR: Will add last result for INR, ABG once components are confirmed Will add last 4 CBG results once components are confirmed  Assessment/Plan:  1. CV - Previous a flutter with HR in the 110's. S/p DCCV. Remaining in SR in the 80's. On Amiodarone 400 mg bid, Lopressor 50 mg bid, Eliquis 5 mg bid, and Lisinopril 2.5 mg daily. SBP in the low 100's. Will stop Lisinopril for  now and decrease Lopressor as well. Patient asking if Eliquis will still be needed now that in SR and told him yes likely, but cardiology also following. 2.  Pulmonary - On room air. Encourage incentive spirometer 3. Volume Overload - On Lasix 40 mg IV bid. Will give only once today and change to oral in am. 4.  Acute blood loss anemia - Last H and H 8.6 and 26.1 5. GI-history of ulcerative colitis. Continue with steroid taper 6. GU-post op urinary retention. Continue Flomax. Foley to remain and he will need to follow up with urology as outpatient. 7. Deconditioned 8. Possibly, home in 1-2 days  ZIMMERMAN,DONIELLE MPA-C 08/15/2015,7:43 AM  I have seen and examined the patient and agree with the assessment and plan as outlined.  Possible d/c home tomorrow if rhythm stable.  Purcell Nailslarence H Makell Cyr, MD 08/15/2015 12:50 PM

## 2015-08-16 DIAGNOSIS — I7101 Dissection of thoracic aorta: Secondary | ICD-10-CM | POA: Diagnosis not present

## 2015-08-16 DIAGNOSIS — I214 Non-ST elevation (NSTEMI) myocardial infarction: Secondary | ICD-10-CM | POA: Diagnosis not present

## 2015-08-16 DIAGNOSIS — I5021 Acute systolic (congestive) heart failure: Secondary | ICD-10-CM | POA: Diagnosis not present

## 2015-08-16 DIAGNOSIS — D65 Disseminated intravascular coagulation [defibrination syndrome]: Secondary | ICD-10-CM | POA: Diagnosis not present

## 2015-08-16 MED ORDER — ATORVASTATIN CALCIUM 80 MG PO TABS: 80.0000 mg | ORAL_TABLET | Freq: Every day | ORAL | Status: AC

## 2015-08-16 MED ORDER — METOPROLOL TARTRATE 25 MG PO TABS: 25.0000 mg | ORAL_TABLET | Freq: Two times a day (BID) | ORAL | Status: DC

## 2015-08-16 MED ORDER — OXYCODONE HCL 5 MG PO TABS: 5.0000 mg | ORAL_TABLET | ORAL | Status: DC | PRN

## 2015-08-16 MED ORDER — APIXABAN 5 MG PO TABS: 5.0000 mg | ORAL_TABLET | Freq: Two times a day (BID) | ORAL | Status: DC

## 2015-08-16 MED ORDER — PREDNISONE 5 MG PO TABS: 10.0000 mg | ORAL_TABLET | Freq: Every day | ORAL | Status: DC

## 2015-08-16 MED ORDER — ASPIRIN 81 MG PO TBEC: 81.0000 mg | DELAYED_RELEASE_TABLET | Freq: Every day | ORAL | Status: AC

## 2015-08-16 MED ORDER — FUROSEMIDE 40 MG PO TABS
40.0000 mg | ORAL_TABLET | Freq: Once | ORAL | Status: AC
  Administered 2015-08-16: 40 mg via ORAL
  Filled 2015-08-16: qty 1

## 2015-08-16 MED ORDER — AMIODARONE HCL 200 MG PO TABS: 200.0000 mg | ORAL_TABLET | Freq: Two times a day (BID) | ORAL | Status: DC

## 2015-08-16 MED ORDER — ACETAMINOPHEN 500 MG PO TABS
1000.0000 mg | ORAL_TABLET | Freq: Four times a day (QID) | ORAL | Status: AC | PRN
Start: 2015-08-16 — End: ?

## 2015-08-16 MED ORDER — TAMSULOSIN HCL 0.4 MG PO CAPS: 0.4000 mg | ORAL_CAPSULE | Freq: Every day | ORAL | Status: DC

## 2015-08-16 NOTE — Progress Notes (Signed)
Patient discharged home with wife and sister-in-law. IV taken out, intact. Patient instructed on medications and discharge instructions and was able to teach back what he learned. Faustino CongressLisa Chelesea Weiand, RN

## 2015-08-16 NOTE — Progress Notes (Signed)
      [address]       [place] [id number]             [phone]        2 Days Post-Op Procedure(s) (LRB): CARDIOVERSION (N/A) TRANSESOPHAGEAL ECHOCARDIOGRAM (TEE) (N/A)  Subjective: Patient feeling better and would like to go home.  Objective: Vital signs in last 24 hours: Temp:  [98.1 F (36.7 C)-98.3 F (36.8 C)] 98.3 F (36.8 C) (10/30 0535) Pulse Rate:  [78-86] 78 (10/30 0535) Cardiac Rhythm:  [-] Normal sinus rhythm (10/30 0700) Resp:  [16-18] 16 (10/30 0535) BP: (107-112)/(67-68) 107/68 mmHg (10/30 0535) SpO2:  [99 %-100 %] 99 % (10/30 0535) Weight:  [196 lb 9.6 oz (89.177 kg)] 196 lb 9.6 oz (89.177 kg) (10/30 0535)  Pre op weight 88 kg Current Weight  08/16/15 196 lb 9.6 oz (89.177 kg)      Intake/Output from previous day: 10/29 0701 - 10/30 0700 In: 720 [P.O.:720] Out: 2850 [Urine:2850]   Physical Exam:  Cardiovascular: RRR Pulmonary: Clear to auscultation bilaterally; no rales, wheezes, or rhonchi. Abdomen: Soft, non tender, bowel sounds present. Extremities: Trace bilateral lower extremity edema. Wounds: Clean and dry.  No erythema or signs of infection.  Lab Results: CBC:No results for input(s): WBC, HGB, HCT, PLT in the last 72 hours. BMET: No results for input(s): NA, K, CL, CO2, GLUCOSE, BUN, CREATININE, CALCIUM in the last 72 hours.  PT/INR:  Lab Results  Component Value Date   INR 1.18 08/10/2015   INR 0.91 08/03/2015   INR 1.78* 08/03/2015   ABG:  INR: Will add last result for INR, ABG once components are confirmed Will add last 4 CBG results once components are confirmed  Assessment/Plan:  1. CV - Previous a flutter with HR in the 110's. S/p DCCV. Remaining in SR in the 80's. On Amiodarone 400 mg bid, Lopressor 25 mg bid, and Eliquis 5 mg bid.  2.  Pulmonary - On room air. Encourage incentive spirometer 3. Volume Overload - Will give Lasix 40 mg today 4.  Acute blood loss anemia - Last H and H 8.6 and  26.1 5. GI-history of ulcerative colitis. Continue with steroid taper 6. GU-post op urinary retention. Continue Flomax. Foley to remain and he will need to follow up with urology as outpatient. 7. Patient on iron and vitamin c pre op so will resume at discharge 8. Will discharge after lunch today  Kennen Stammer MPA-C 08/16/2015,8:21 AM

## 2015-08-17 ENCOUNTER — Encounter (HOSPITAL_COMMUNITY): Payer: Self-pay | Admitting: Cardiology

## 2015-08-18 NOTE — Op Note (Signed)
Ryan Cole, Ryan Cole NO.:  [id number]  MEDICAL RECORD NO.:  [id number]  LOCATION:  [id number]                        FACILITY:  [facility]  PHYSICIAN:  Salvatore Decent. Dorris Fetch, M.D.DATE OF BIRTH:  Jun 23, 1956  DATE OF PROCEDURE:  08/03/2015 DATE OF DISCHARGE:  08/16/2015                              OPERATIVE REPORT   PREOPERATIVE DIAGNOSIS:  Type 1 aortic dissection.  POSTOPERATIVE DIAGNOSIS:  Type 1 aortic dissection.  PROCEDURE:   Repair of type 1 aortic dissection with replacement of the  aortic root, ascending aorta, and arch Resuspension of the aortic valve Deep hypothermic circulatory arrest with antegrade cerebral perfusion Right axillary artery cannulation.  SURGEON:  Salvatore Decent. Dorris Fetch, M.D.  ASSISTANT:  Coral Ceo, P.A.  ANESTHESIA:  General.  FINDINGS:  Acute type 1 dissection extending from the coronaries through the aortic arch.  There was an inflammatory thickening of the aorta adjacent to the pulmonary artery in the mid ascending.  The dissection extended into the innominate and left common carotid arteries. Left subclavian artery spared.  Severe aortic insufficiency. Coagulopathy. Post bypass transesophageal echocardiography showed preserved left ventricular function with mild-to-moderate aortic insufficiency.  CLINICAL NOTE:  Ryan Cole is a 59 year old man, who presented with acute onset of chest pain.  He had an episode 10 days prior that resolved spontaneously.  This episode was more severe and persistent.  He came to the emergency room.  A CT was done to rule out pulmonary embolus.  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  A transthoracic echocardiogram was done in the emergency room. It revealed a type 1 dissection.  The patient was advised to undergo surgical repair.  The indications, risks, benefits, and alternatives were discussed in detail with the patient. He understood and accepted the risks and agreed to proceed.  OPERATIVE NOTE:   Ryan Cole was brought emergently to the operating room on August 03, 2015.  Anesthesia placed an arterial blood pressure monitoring line and a Swan-Ganz catheter.  He was anesthetized and intubated.  Intravenous antibiotics were administered.  Transesophageal echocardiography was performed.  It showed a type 1 dissection with severe prolapse of the aortic valve and aortic insufficiency.  The chest, abdomen, and legs were prepped and draped in the usual sterile fashion.    An incision was made in the right deltopectoral groove.  The right axillary artery was dissected out.  The patient was given 5000 units of heparin.  Proximal and distal control was obtained on the right axillary artery, and it was clamped and an arteriotomy was made.  An arterial cannula with a Gore-Tex graft attached was sewn end-to-side with a running 5-0 Prolene suture.  The distal clamp was removed first for de-airing and then the proximal clamp was removed.  The cannula was connected to the bypass circuit.  A median sternotomy was performed.  A sternal retractor was placed.  The pericardium was carefully opened. There was an obvious aortic dissection, and there was a bloody pericardial effusion.  There was no frank rupture.  The remainder of the full heparin dose was given.  A dual-stage venous cannula was placed via a pursestring suture in the right atrial appendage.  Cardiopulmonary bypass was initiated and cooling was begun immediately with a target of 18 degrees Celsius.  A left ventricular vent was placed via a pursestring suture in the right superior pulmonary vein.  A retrograde cardioplegia cannula was placed via a pursestring suture in the right atrium into the coronary sinus.  A foam pad was placed on the pericardium to insulate the heart.  A temperature probe was placed in the myocardial septum. The coronary arteries were palpated. There was no obvious calcific plaque in the coronary arteries.  The aorta  was crossclamped.  The left ventricle was emptied with the vent.  Cardiac arrest then was achieved with cold retrograde blood cardioplegia and topical iced saline.  Cardioplegia was administered until there was a complete diastolic arrest and myocardial septal cooling to less than 10 degrees Celsius.  Additional retrograde cardioplegia was administered at 20-minute intervals during the procedure.  The aorta was transected.  There was a type 1 dissection that went down into the aortic root.  The coronary ostia were intact.  The root was aneurysmal.  On the medial aspect of the aorta adjacent to the pulmonary artery, there was a highly thickened area consistent with inflammatory changes.  The aortic valve leaflets were inspected, all were intact. The aortic root was resected leaving buttons for the coronary arteries. The aortic root sized for a 32-mm Hemashield graft.  A small piece of the Hemashield graft then was sewn to the aortic root.  2-0 Ethibond horizontal mattress sutures with pledgets were utilized.  The graft was lowered into place.  The aortic commissures were tacked to the side of the graft at 120-degree intervals.  A running 4-0 Prolene suture was used to sew the annulus to the graft circumferentially.  The aortic valve appeared competent.  By this point, the patient had cooled to 18 degrees Celsius.  Propofol and steroids were administered.  The BIS reading was 0.  The patient was placed in steep Trendelenburg position.  Deep hypothermic circulatory arrest was commenced.  The patient was exsanguinated.  The aortic crossclamp was removed.  The arch was inspected.  The dissection extended into both the innominate and left common carotid arteries, the subclavian artery was spared.  A clamp was placed on the innominate artery and antegrade cerebral perfusion was initiated.  BioGlue was applied in the aortic wall distally, and the distal anastomosis was performed with a running  4-0 Prolene suture.  A Teflon felt buttress was used on the aortic side.  The anastomosis was performed to the distal arch just before the takeoff of the left subclavian artery.  A 12 x 8 x 8 mm graft then was selected and the 8-mm limbs were anastomosed end-to- end to the left common carotid and innominate arteries with running 4-0 Prolene sutures.  The graft then was trimmed leaving a Carrel patch, this was sewn end-to-side into the graft with a running 4-0 Prolene suture. At the completion of this anastomosis, flow was re-established and a crossclamp was placed on the graft.  The total circulatory arrest time was 107 minutes.  The antegrade cerebral perfusion time was 90 minutes. It should be noted that the graft was de-aired as perfusion was resumed. Flows were once again maintained per protocol and rewarming was initiated.  Holes were created in the Hemashield graft proximally for anastomosis of the coronary buttons which were done with running 6-0 Prolene sutures.  Finally, the 2 ends of the grafts were cut to length and a graft-to-graft end-to-end anastomosis  was performed with a running 4-0 Prolene suture.  McGoon needle was placed into the graft. The patient was placed into and maintained in Trendelenburg position.  A warm dose of retrograde cardioplegia was administered.  De-airing was performed of the left ventricle and graft, and the aortic crossclamp was removed.  Total crossclamp time was 256 minutes.  While rewarming was completed, all anastomoses were inspected for hemostasis.  Multiple additional sutures were needed.  The retrograde cardioplegia cannula was removed from the right atrium.  Epicardial pacing wires were placed on the right ventricle and right atrium.  When the patient had rewarmed to a core temperature of 37 degrees Celsius, he was weaned from cardiopulmonary bypass on the first attempt.  The total bypass time was 318 minutes.  The left ventricular vent was  removed.  No residual air was noted.  Postbypass transesophageal echocardiography revealed mild, 1 to 2+, aortic insufficiency with preserved left ventricular function.  A test dose of protamine was administered and was well tolerated.  The atrial cannula was removed.  The axillary cannula was clamped, divided, and then oversewn with a 5-0 Prolene suture.  The chest was copiously irrigated with warm saline.  Hemostasis was achieved.  There was coagulopathy which was treated with fresh frozen plasma, packed red blood cells, and platelet transfusions.  The pericardium was reapproximated over the ascending aorta and base of the heart with interrupted 3-0 silk sutures.  Two mediastinal chest tubes were placed through separate subcostal incisions.  The sternum was closed with interrupted heavy gauge stainless steel wires.  The pectoralis fascia, subcutaneous tissue, and skin were closed in standard fashion.  The right upper chest incision was closed with a running 2-0 Vicryl subcutaneous suture and a 3-0 Vicryl subcuticular suture.  All sponge, needle, and instrument counts were correct at the end of the procedure.  The patient remained hemodynamically stable throughout the postbypass.  There was ongoing coagulopathic bleeding.  The patient was taken from the operating room to the Surgical Intensive Care Unit intubated and in critical condition.     Salvatore DecentSteven C. Dorris FetchHendrickson, M.D.     SCH/MEDQ  D:  08/17/2015  T:  08/18/2015  Job:  [id number]

## 2015-08-20 ENCOUNTER — Encounter (HOSPITAL_COMMUNITY): Payer: Self-pay | Admitting: Thoracic Surgery (Cardiothoracic Vascular Surgery)

## 2015-08-28 ENCOUNTER — Encounter (HOSPITAL_COMMUNITY): Payer: Self-pay | Admitting: Family Medicine

## 2015-08-28 ENCOUNTER — Inpatient Hospital Stay (HOSPITAL_COMMUNITY)
Admission: EM | Admit: 2015-08-28 | Discharge: 2015-09-01 | DRG: 872 | Disposition: A | Discharge status: Home / Self Care | Payer: Non-veteran care | Attending: Internal Medicine | Admitting: Internal Medicine

## 2015-08-28 ENCOUNTER — Observation Stay (HOSPITAL_COMMUNITY): Payer: Non-veteran care

## 2015-08-28 DIAGNOSIS — R531 Weakness: Secondary | ICD-10-CM | POA: Diagnosis present

## 2015-08-28 DIAGNOSIS — N179 Acute kidney failure, unspecified: Secondary | ICD-10-CM | POA: Diagnosis present

## 2015-08-28 DIAGNOSIS — B961 Klebsiella pneumoniae [K. pneumoniae] as the cause of diseases classified elsewhere: Secondary | ICD-10-CM | POA: Diagnosis present

## 2015-08-28 DIAGNOSIS — I959 Hypotension, unspecified: Secondary | ICD-10-CM | POA: Diagnosis present

## 2015-08-28 DIAGNOSIS — J9811 Atelectasis: Secondary | ICD-10-CM | POA: Diagnosis present

## 2015-08-28 DIAGNOSIS — D72829 Elevated white blood cell count, unspecified: Secondary | ICD-10-CM | POA: Diagnosis not present

## 2015-08-28 DIAGNOSIS — M545 Low back pain: Secondary | ICD-10-CM | POA: Diagnosis present

## 2015-08-28 DIAGNOSIS — T83511A Infection and inflammatory reaction due to indwelling urethral catheter, initial encounter: Secondary | ICD-10-CM

## 2015-08-28 DIAGNOSIS — R339 Retention of urine, unspecified: Secondary | ICD-10-CM | POA: Diagnosis not present

## 2015-08-28 DIAGNOSIS — I10 Essential (primary) hypertension: Secondary | ICD-10-CM | POA: Diagnosis present

## 2015-08-28 DIAGNOSIS — I4892 Unspecified atrial flutter: Secondary | ICD-10-CM | POA: Diagnosis present

## 2015-08-28 DIAGNOSIS — I5022 Chronic systolic (congestive) heart failure: Secondary | ICD-10-CM | POA: Diagnosis present

## 2015-08-28 DIAGNOSIS — R000 Tachycardia, unspecified: Secondary | ICD-10-CM | POA: Diagnosis present

## 2015-08-28 DIAGNOSIS — I4891 Unspecified atrial fibrillation: Secondary | ICD-10-CM | POA: Diagnosis present

## 2015-08-28 DIAGNOSIS — A414 Sepsis due to anaerobes: Secondary | ICD-10-CM

## 2015-08-28 DIAGNOSIS — I110 Hypertensive heart disease with heart failure: Secondary | ICD-10-CM | POA: Diagnosis present

## 2015-08-28 DIAGNOSIS — R7881 Bacteremia: Secondary | ICD-10-CM

## 2015-08-28 DIAGNOSIS — E785 Hyperlipidemia, unspecified: Secondary | ICD-10-CM | POA: Diagnosis present

## 2015-08-28 DIAGNOSIS — A419 Sepsis, unspecified organism: Principal | ICD-10-CM | POA: Diagnosis present

## 2015-08-28 DIAGNOSIS — N390 Urinary tract infection, site not specified: Secondary | ICD-10-CM | POA: Diagnosis present

## 2015-08-28 DIAGNOSIS — K5190 Ulcerative colitis, unspecified, without complications: Secondary | ICD-10-CM | POA: Diagnosis present

## 2015-08-28 DIAGNOSIS — R509 Fever, unspecified: Secondary | ICD-10-CM

## 2015-08-28 DIAGNOSIS — E876 Hypokalemia: Secondary | ICD-10-CM | POA: Diagnosis not present

## 2015-08-28 DIAGNOSIS — G4700 Insomnia, unspecified: Secondary | ICD-10-CM | POA: Diagnosis present

## 2015-08-28 DIAGNOSIS — Z8673 Personal history of transient ischemic attack (TIA), and cerebral infarction without residual deficits: Secondary | ICD-10-CM

## 2015-08-28 DIAGNOSIS — J90 Pleural effusion, not elsewhere classified: Secondary | ICD-10-CM | POA: Insufficient documentation

## 2015-08-28 HISTORY — DX: Chronic systolic (congestive) heart failure: I50.22

## 2015-08-28 LAB — CBC WITH DIFFERENTIAL/PLATELET
BASOS ABS: 0 10*3/uL (ref 0.0–0.1)
BASOS PCT: 0 %
EOS ABS: 0 10*3/uL (ref 0.0–0.7)
Eosinophils Relative: 0 %
HEMATOCRIT: 31.1 % — AB (ref 39.0–52.0)
HEMOGLOBIN: 9.8 g/dL — AB (ref 13.0–17.0)
Lymphocytes Relative: 4 %
Lymphs Abs: 0.6 10*3/uL — ABNORMAL LOW (ref 0.7–4.0)
MCH: 27.9 pg (ref 26.0–34.0)
MCHC: 31.5 g/dL (ref 30.0–36.0)
MCV: 88.6 fL (ref 78.0–100.0)
MONOS PCT: 7 %
Monocytes Absolute: 1 10*3/uL (ref 0.1–1.0)
NEUTROS ABS: 12.7 10*3/uL — AB (ref 1.7–7.7)
NEUTROS PCT: 89 %
Platelets: 190 10*3/uL (ref 150–400)
RBC: 3.51 MIL/uL — AB (ref 4.22–5.81)
RDW: 15.9 % — ABNORMAL HIGH (ref 11.5–15.5)
WBC: 14.2 10*3/uL — AB (ref 4.0–10.5)

## 2015-08-28 LAB — URINALYSIS, ROUTINE W REFLEX MICROSCOPIC
BILIRUBIN URINE: NEGATIVE
Glucose, UA: NEGATIVE mg/dL
KETONES UR: NEGATIVE mg/dL
NITRITE: NEGATIVE
PROTEIN: 30 mg/dL — AB
Specific Gravity, Urine: 1.012 (ref 1.005–1.030)
UROBILINOGEN UA: 1 mg/dL (ref 0.0–1.0)
pH: 7.5 (ref 5.0–8.0)

## 2015-08-28 LAB — COMPREHENSIVE METABOLIC PANEL
ALK PHOS: 100 U/L (ref 38–126)
ALT: 16 U/L — ABNORMAL LOW (ref 17–63)
ANION GAP: 10 (ref 5–15)
AST: 20 U/L (ref 15–41)
Albumin: 3.5 g/dL (ref 3.5–5.0)
BUN: 17 mg/dL (ref 6–20)
CALCIUM: 9.3 mg/dL (ref 8.9–10.3)
CO2: 24 mmol/L (ref 22–32)
Chloride: 105 mmol/L (ref 101–111)
Creatinine, Ser: 1.24 mg/dL (ref 0.61–1.24)
Glucose, Bld: 86 mg/dL (ref 65–99)
Potassium: 4.1 mmol/L (ref 3.5–5.1)
SODIUM: 139 mmol/L (ref 135–145)
Total Bilirubin: 0.7 mg/dL (ref 0.3–1.2)
Total Protein: 6.1 g/dL — ABNORMAL LOW (ref 6.5–8.1)

## 2015-08-28 LAB — PROTIME-INR
INR: 1.45 (ref 0.00–1.49)
PROTHROMBIN TIME: 17.8 s — AB (ref 11.6–15.2)

## 2015-08-28 LAB — URINE MICROSCOPIC-ADD ON

## 2015-08-28 LAB — PROCALCITONIN: PROCALCITONIN: 6.9 ng/mL

## 2015-08-28 LAB — I-STAT CG4 LACTIC ACID, ED
LACTIC ACID, VENOUS: 1.36 mmol/L (ref 0.5–2.0)
LACTIC ACID, VENOUS: 1.85 mmol/L (ref 0.5–2.0)

## 2015-08-28 LAB — APTT: aPTT: 37 s (ref 24–37)

## 2015-08-28 MED ORDER — DEXTROSE 5 % IV SOLN
1.0000 g | INTRAVENOUS | Status: DC
  Filled 2015-08-28: qty 10

## 2015-08-28 MED ORDER — SODIUM CHLORIDE 0.9 % IV BOLUS (SEPSIS)
1000.0000 mL | INTRAVENOUS | Status: AC
  Administered 2015-08-28 (×3): 1000 mL via INTRAVENOUS

## 2015-08-28 MED ORDER — DEXTROSE 5 % IV SOLN: 1.0000 g | INTRAVENOUS | Status: DC

## 2015-08-28 MED ORDER — FERROUS SULFATE 325 (65 FE) MG PO TABS
325.0000 mg | ORAL_TABLET | Freq: Every day | ORAL | Status: DC
  Administered 2015-08-29 – 2015-09-01 (×4): 325 mg via ORAL
  Filled 2015-08-28 (×4): qty 1

## 2015-08-28 MED ORDER — ACETAMINOPHEN 650 MG RE SUPP: 650.0000 mg | Freq: Four times a day (QID) | RECTAL | Status: DC | PRN

## 2015-08-28 MED ORDER — IBUPROFEN 400 MG PO TABS
400.0000 mg | ORAL_TABLET | Freq: Four times a day (QID) | ORAL | Status: DC | PRN
  Administered 2015-08-28: 400 mg via ORAL
  Filled 2015-08-28: qty 1

## 2015-08-28 MED ORDER — APIXABAN 5 MG PO TABS
5.0000 mg | ORAL_TABLET | Freq: Two times a day (BID) | ORAL | Status: DC
  Administered 2015-08-28 – 2015-08-29 (×2): 5 mg via ORAL
  Filled 2015-08-28 (×2): qty 1

## 2015-08-28 MED ORDER — METOPROLOL TARTRATE 25 MG PO TABS: 25.0000 mg | ORAL_TABLET | Freq: Two times a day (BID) | ORAL | Status: DC

## 2015-08-28 MED ORDER — ACETAMINOPHEN 325 MG PO TABS
650.0000 mg | ORAL_TABLET | Freq: Four times a day (QID) | ORAL | Status: DC | PRN
  Administered 2015-08-28 – 2015-08-29 (×2): 650 mg via ORAL
  Filled 2015-08-28 (×2): qty 2

## 2015-08-28 MED ORDER — SODIUM CHLORIDE 0.9 % IV SOLN: INTRAVENOUS | Status: DC

## 2015-08-28 MED ORDER — SODIUM CHLORIDE 0.9 % IV BOLUS (SEPSIS)
1000.0000 mL | Freq: Once | INTRAVENOUS | Status: AC
  Administered 2015-08-28: 1000 mL via INTRAVENOUS

## 2015-08-28 MED ORDER — ASPIRIN EC 81 MG PO TBEC
81.0000 mg | DELAYED_RELEASE_TABLET | Freq: Every day | ORAL | Status: DC
  Administered 2015-08-28 – 2015-09-01 (×5): 81 mg via ORAL
  Filled 2015-08-28 (×5): qty 1

## 2015-08-28 MED ORDER — MESALAMINE 1.2 G PO TBEC
2.4000 g | DELAYED_RELEASE_TABLET | Freq: Every day | ORAL | Status: DC
  Administered 2015-08-28 – 2015-09-01 (×5): 2.4 g via ORAL
  Filled 2015-08-28 (×7): qty 2

## 2015-08-28 MED ORDER — FERROUS SULFATE 325 (65 FE) MG PO TBEC: 325.0000 mg | DELAYED_RELEASE_TABLET | Freq: Every day | ORAL | Status: DC

## 2015-08-28 MED ORDER — ADULT MULTIVITAMIN W/MINERALS CH
1.0000 | ORAL_TABLET | Freq: Every day | ORAL | Status: DC
  Administered 2015-08-28 – 2015-09-01 (×5): 1 via ORAL
  Filled 2015-08-28 (×5): qty 1

## 2015-08-28 MED ORDER — CEFTRIAXONE SODIUM 1 G IJ SOLR
1.0000 g | Freq: Once | INTRAMUSCULAR | Status: AC
  Administered 2015-08-28: 1 g via INTRAVENOUS
  Filled 2015-08-28: qty 10

## 2015-08-28 MED ORDER — AMIODARONE HCL 200 MG PO TABS
200.0000 mg | ORAL_TABLET | Freq: Every day | ORAL | Status: DC
  Administered 2015-08-28 – 2015-09-01 (×5): 200 mg via ORAL
  Filled 2015-08-28 (×5): qty 1

## 2015-08-28 MED ORDER — KETOROLAC TROMETHAMINE 30 MG/ML IJ SOLN: 30.0000 mg | Freq: Four times a day (QID) | INTRAMUSCULAR | Status: DC | PRN

## 2015-08-28 MED ORDER — TAMSULOSIN HCL 0.4 MG PO CAPS
0.4000 mg | ORAL_CAPSULE | Freq: Every day | ORAL | Status: DC
  Administered 2015-08-29 – 2015-09-01 (×4): 0.4 mg via ORAL
  Filled 2015-08-28 (×4): qty 1

## 2015-08-28 MED ORDER — ATORVASTATIN CALCIUM 80 MG PO TABS
80.0000 mg | ORAL_TABLET | Freq: Every day | ORAL | Status: DC
  Administered 2015-08-28 – 2015-09-01 (×4): 80 mg via ORAL
  Filled 2015-08-28 (×5): qty 1

## 2015-08-28 MED ORDER — ACETAMINOPHEN 325 MG PO TABS
325.0000 mg | ORAL_TABLET | Freq: Once | ORAL | Status: AC
  Administered 2015-08-28: 650 mg via ORAL
  Filled 2015-08-28: qty 1

## 2015-08-28 NOTE — ED Notes (Signed)
t here from home via EMS with c/o urinary retention x 24 hours. Pt had AAA repair here and had indwelling catheter until the 9th. Pt reports trouble urinating since then.

## 2015-08-28 NOTE — ED Provider Notes (Signed)
CSN: [id number]     Arrival date & time 08/28/15  1102 History   First MD Initiated Contact with Patient 08/28/15 1102     Chief Complaint  Patient presents with  . Urinary Retention     (Consider location/radiation/quality/duration/timing/severity/associated sxs/prior Treatment) The history is provided by the patient and medical records. No language interpreter was used.   Ryan Cole is a 59 y.o. male  with a PMH of thoracic dissection repair on 08/03/2015 presents to the Emergency Department complaining of urinary retention x 2 days. He went to the [place] for removal of his foley on 11/09 and has had worsening pain and retention over the past 2 days. Pt. States he has constant urgency. Associated pain 9/10 suprapubic, RLQ. No alleviating factors. Aggravated with straining. Patient states he has no history of these symptoms prior to recent surgery.    Past Medical History  Diagnosis Date  . 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 HYPERLIPIDEMIA 06/08/2007  . HYPOGONADISM, MALE 10/10/2007  . INSOMNIA-SLEEP DISORDER-UNSPEC 10/10/2007  . LOW BACK PAIN 06/08/2007  . WEAKNESS, LEFT SIDE OF BODY 10/10/2007  . Hypertension    Past Surgical History  Procedure Laterality Date  . Achilles    . Abdominal surgery  2014    removal of benign colon mass   . Cardiac catheterization N/A 08/11/2015    Procedure: Left Heart Cath and Coronary Angiography;  Surgeon: Corky Crafts, MD;  Location: [facility] INVASIVE CV LAB;  Service: Cardiovascular;  Laterality: N/A;  . Cardioversion N/A 08/14/2015    Procedure: CARDIOVERSION;  Surgeon: Lewayne Bunting, MD;  Location: [facility] ENDOSCOPY;  Service: Cardiovascular;  Laterality: N/A;  . Tee without cardioversion N/A 08/14/2015    Procedure: TRANSESOPHAGEAL ECHOCARDIOGRAM (TEE);  Surgeon: Lewayne Bunting, MD;  Location: [facility] ENDOSCOPY;  Service: Cardiovascular;  Laterality: N/A;  . Thoracic aortic aneurysm repair N/A 08/02/2015     Procedure: THORACIC ASCENDING ANEURYSM REPAIR (AAA);  Surgeon: Loreli Slot, MD;  Location: [facility] OR;  Service: Open Heart Surgery;  Laterality: N/A;   Family History  Problem Relation Age of Onset  . Heart failure Father 43   Social History  Substance Use Topics  . Smoking status: Never Smoker   . Smokeless tobacco: None  . Alcohol Use: No    Review of Systems  Constitutional: Negative.   HENT: Negative for congestion, rhinorrhea and sore throat.   Eyes: Negative for visual disturbance.  Respiratory: Negative for cough, shortness of breath and wheezing.   Cardiovascular: Negative.   Gastrointestinal: Negative for nausea, vomiting, abdominal pain, diarrhea and constipation.  Endocrine: Negative for polydipsia and polyuria.  Genitourinary: Positive for urgency and difficulty urinating. Negative for hematuria and flank pain.  Musculoskeletal: Negative for myalgias, back pain, arthralgias and neck pain.  Skin: Negative for rash.  Neurological: Negative for dizziness, weakness and headaches.      Allergies  Review of patient's allergies indicates no known allergies.  Home Medications   Prior to Admission medications   Medication Sig Start Date End Date Taking? Authorizing Provider  acetaminophen (TYLENOL) 500 MG tablet Take 2 tablets (1,000 mg total) by mouth every 6 (six) hours as needed for mild pain. 08/16/15  Yes Donielle Margaretann Loveless, PA-C  amiodarone (PACERONE) 200 MG tablet Take 1 tablet (200 mg total) by mouth 2 (two) times daily. For one week;then take Amiodarone 200 mg daily by mouth thereafter Patient taking differently: Take 200 mg by mouth daily.  08/16/15  Yes Donielle  Margaretann LovelessM Zimmerman, PA-C  apixaban (ELIQUIS) 5 MG TABS tablet Take 1 tablet (5 mg total) by mouth 2 (two) times daily. 08/16/15  Yes Donielle Margaretann LovelessM Zimmerman, PA-C  aspirin EC 81 MG EC tablet Take 1 tablet (81 mg total) by mouth daily. 08/16/15  Yes Donielle Margaretann LovelessM Zimmerman, PA-C  atorvastatin (LIPITOR) 80 MG  tablet Take 1 tablet (80 mg total) by mouth daily at 6 PM. 08/16/15  Yes Donielle Margaretann LovelessM Zimmerman, PA-C  ferrous sulfate 325 (65 FE) MG EC tablet Take 325 mg by mouth daily with breakfast.   Yes Historical Provider, MD  mesalamine (APRISO) 0.375 G 24 hr capsule Take 1,500 mg by mouth daily.   Yes Historical Provider, MD  metoprolol tartrate (LOPRESSOR) 25 MG tablet Take 1 tablet (25 mg total) by mouth 2 (two) times daily. Patient taking differently: Take 25 mg by mouth 2 (two) times daily as needed. For high readings of blood pressure 08/16/15  Yes Donielle Margaretann LovelessM Zimmerman, PA-C  Multiple Vitamin (MULTIVITAMIN WITH MINERALS) TABS tablet Take 1 tablet by mouth daily.   Yes Historical Provider, MD  oxyCODONE (OXY IR/ROXICODONE) 5 MG immediate release tablet Take 1-2 tablets (5-10 mg total) by mouth every 4 (four) hours as needed for severe pain. 08/16/15  Yes Donielle Margaretann LovelessM Zimmerman, PA-C  tamsulosin (FLOMAX) 0.4 MG CAPS capsule Take 1 capsule (0.4 mg total) by mouth daily after breakfast. 08/16/15  Yes Donielle Margaretann LovelessM Zimmerman, PA-C  Melatonin 3 MG TABS Take 6-9 mg by mouth at bedtime as needed (FOR SLEEP).    Historical Provider, MD  predniSONE (DELTASONE) 5 MG tablet Take 2 tablets (10 mg total) by mouth daily with breakfast. For 5 days; then take Prednisone 5 mg by mouth daily 4 days;then stop Patient not taking: Reported on 08/28/2015 08/16/15   Donielle M Zimmerman, PA-C   BP 101/70 mmHg  Pulse 115  Temp(Src) 101.5 F (38.6 C) (Oral)  Resp 22  Ht 6\' 2"  (1.88 m)  Wt 195 lb (88.451 kg)  BMI 25.03 kg/m2  SpO2 95% Physical Exam  Constitutional: He is oriented to person, place, and time. He appears well-developed and well-nourished.  Alert and in no acute distress  HENT:  Head: Normocephalic and atraumatic.  Cardiovascular: Normal rate, regular rhythm, normal heart sounds and intact distal pulses.  Exam reveals no gallop and no friction rub.   No murmur heard. tachy but regular IV/VI SEM    Pulmonary/Chest: Effort normal and breath sounds normal. No respiratory distress. He has no wheezes. He has no rales. He exhibits no tenderness.  Abdominal: He exhibits no mass.  Abdomen soft, non-distended Tenderness to palpation RLQ and suprapubic Bowel sounds positive in all four quadrants   Musculoskeletal: He exhibits no edema.  Neurological: He is alert and oriented to person, place, and time. No cranial nerve deficit.  Skin: Skin is warm and dry. No rash noted.  Psychiatric: He has a normal mood and affect. His behavior is normal. Judgment and thought content normal.  Nursing note and vitals reviewed.   ED Course  Procedures (including critical care time) Labs Review Labs Reviewed  URINALYSIS, ROUTINE W REFLEX MICROSCOPIC (NOT AT [facility]) - Abnormal; Notable for the following:    APPearance CLOUDY (*)    Hgb urine dipstick MODERATE (*)    Protein, ur 30 (*)    Leukocytes, UA MODERATE (*)    All other components within normal limits  URINE MICROSCOPIC-ADD ON - Abnormal; Notable for the following:    Bacteria, UA MANY (*)  All other components within normal limits  COMPREHENSIVE METABOLIC PANEL - Abnormal; Notable for the following:    Total Protein 6.1 (*)    ALT 16 (*)    All other components within normal limits  CBC WITH DIFFERENTIAL/PLATELET - Abnormal; Notable for the following:    WBC 14.2 (*)    RBC 3.51 (*)    Hemoglobin 9.8 (*)    HCT 31.1 (*)    RDW 15.9 (*)    Neutro Abs 12.7 (*)    Lymphs Abs 0.6 (*)    All other components within normal limits  CULTURE, BLOOD (ROUTINE X 2)  CULTURE, BLOOD (ROUTINE X 2)  URINE CULTURE  I-STAT CG4 LACTIC ACID, ED    Imaging Review No results found. I have personally reviewed and evaluated these images and lab results as part of my medical decision-making.   EKG Interpretation None      MDM   Final diagnoses:  Urinary retention  Urinary tract infection associated with catheterization of urinary tract, initial  encounter   Ryan Cole presents with urinary retention x 2 days s/p foley removal.   UA shows moderate leuks with many bacteria; pt has been tacky and hypotensive in ED, will initiate sepsis workup and start Rocephin   12:53 PM - Patient re-evaluated and feels much improved; vitals still concerning  Labs show lactic of 1.36, CMP reassuring; white count 14.2 with ab neutro 12.7, H&H 9.8/31.1  Will admit patient for further evaluation and treatment.     [facility] Bijan Ridgley, PA-C 08/28/15 1456  Leta Baptist, MD 08/30/15 [phone]

## 2015-08-28 NOTE — H&P (Addendum)
[facility] History and Physical  Ryan MorinDaryl V Westwood NWG:[id number] DOB: 01/30/1956 DOA: 08/28/2015  Referring physician: Kennon Holterpilcher PCP: Oliver BarreJames John, MD   Chief Complaint: urinary retention/  HPI: Ryan MorinDaryl V Cole is a  59 y.o. male with a past medical history that includes hyperlipidemia, hypertension, A. fib status post cardioversion, aortic dissection status post surgery, presents to the emergency department with the chief complaint of urinary retention. Initial evaluation in the emergency department reveals a leukocytosis temperature greater than 101 tachycardia and urinary tract infection CONCERNING FOR SEPSIS.  Patient reports he was discharged from the hospital at the end of October and at that time he 1 home with a Foley. [facility] removed on November 9. He waited 6 hours but he did urinate. Since that time he's had difficulty urinating and he reports no urine from the last 24 hours. Associated symptoms include lower abdominal pain mild nausea no vomiting. He came to the emergency department a Foley catheter was inserted and he immediately drained 1 L of gold clear urine. He denies any fever chills diaphoresis chest pain palpitation shortness of breath syncope or near-syncope. He denies any diarrhea constipation melena bright red blood per rectum.  Workup in the emergency department reveals a lactic acid within the limits of normal. Complete metabolic panel unremarkable. Complete blood count with WBCs of 14.2, hemoglobin 9.8 urinalysis consistent with UTI.  The emergency department he has a normal temperature 101.5 Leigh hypotensive tachycardic at 1:15 he is not hypoxic.  He is provided with 2 L of IV fluid and Rocephin. At the time of my exam he is alert and oriented and not toxic appearing   Review of Systems:  10 point review of systems complete and all systems are negative except as indicated in the history of  present illness  Past Medical History  Diagnosis Date  . 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 HYPERLIPIDEMIA 06/08/2007  . HYPOGONADISM, MALE 10/10/2007  . INSOMNIA-SLEEP DISORDER-UNSPEC 10/10/2007  . LOW BACK PAIN 06/08/2007  . WEAKNESS, LEFT SIDE OF BODY 10/10/2007  . Hypertension    Past Surgical History  Procedure Laterality Date  . Achilles    . Abdominal surgery  2014    removal of benign colon mass   . Cardiac catheterization N/A 08/11/2015    Procedure: Left Heart Cath and Coronary Angiography;  Surgeon: Corky CraftsJayadeep S Varanasi, MD;  [facility];  Service: Cardiovascular;  Laterality: N/A;  . Cardioversion N/A 08/14/2015    Procedure: CARDIOVERSION;  Surgeon: Lewayne BuntingBrian S Crenshaw, MD;  [facility];  Service: Cardiovascular;  Laterality: N/A;  . Tee without cardioversion N/A 08/14/2015    Procedure: TRANSESOPHAGEAL ECHOCARDIOGRAM (TEE);  Surgeon: Lewayne BuntingBrian S Crenshaw, MD;  [facility];  Service: Cardiovascular;  Laterality: N/A;  . Thoracic aortic aneurysm repair N/A 08/02/2015    Procedure: THORACIC ASCENDING ANEURYSM REPAIR (AAA);  Surgeon: Loreli SlotSteven C Hendrickson, MD;  [facility];  Service: Open Heart Surgery;  Laterality: N/A;   Social History:  reports that he has never smoked. He does not have any smokeless tobacco history on file. He reports that he does not drink alcohol or use illicit drugs.  No Known Allergies  Family History  Problem Relation Age of Onset  . Heart failure Father [id number]     Prior to Admission medications   Medication Sig Start Date End Date Taking? Authorizing Provider  acetaminophen (TYLENOL) 500 MG  tablet Take 2 tablets (1,000 mg total) by mouth every 6 (six) hours as needed for mild pain. 08/16/15  Yes Donielle Margaretann Loveless, PA-C  amiodarone (PACERONE) 200 MG tablet Take 1 tablet (200 mg total) by mouth 2 (two) times daily. For one week;then take Amiodarone 200 mg daily by mouth  thereafter Patient taking differently: Take 200 mg by mouth daily.  08/16/15  Yes Donielle Margaretann Loveless, PA-C  apixaban (ELIQUIS) 5 MG TABS tablet Take 1 tablet (5 mg total) by mouth 2 (two) times daily. 08/16/15  Yes Donielle Margaretann Loveless, PA-C  aspirin EC 81 MG EC tablet Take 1 tablet (81 mg total) by mouth daily. 08/16/15  Yes Donielle Margaretann Loveless, PA-C  atorvastatin (LIPITOR) 80 MG tablet Take 1 tablet (80 mg total) by mouth daily at 6 PM. 08/16/15  Yes Donielle Margaretann Loveless, PA-C  ferrous sulfate 325 (65 FE) MG EC tablet Take 325 mg by mouth daily with breakfast.   Yes Historical Provider, MD  mesalamine (APRISO) 0.375 G 24 hr capsule Take 1,500 mg by mouth daily.   Yes Historical Provider, MD  metoprolol tartrate (LOPRESSOR) 25 MG tablet Take 1 tablet (25 mg total) by mouth 2 (two) times daily. Patient taking differently: Take 25 mg by mouth 2 (two) times daily as needed. For high readings of blood pressure 08/16/15  Yes Donielle Margaretann Loveless, PA-C  Multiple Vitamin (MULTIVITAMIN WITH MINERALS) TABS tablet Take 1 tablet by mouth daily.   Yes Historical Provider, MD  oxyCODONE (OXY IR/ROXICODONE) 5 MG immediate release tablet Take 1-2 tablets (5-10 mg total) by mouth every 4 (four) hours as needed for severe pain. 08/16/15  Yes Donielle Margaretann Loveless, PA-C  tamsulosin (FLOMAX) 0.4 MG CAPS capsule Take 1 capsule (0.4 mg total) by mouth daily after breakfast. 08/16/15  Yes Donielle Margaretann Loveless, PA-C  Melatonin 3 MG TABS Take 6-9 mg by mouth at bedtime as needed (FOR SLEEP).    Historical Provider, MD   Physical Exam: Filed Vitals:   08/28/15 1315 08/28/15 1338 08/28/15 1345 08/28/15 1500  BP: 103/65 97/61 101/70 107/68  Pulse: 120 115 115 113  Temp:      TempSrc:      Resp: Height:  (1.88 m)     Weight: 88.451 kg (195 lb)     SpO2: 95% 96% 95% 96%    Wt Readings from Last 3 Encounters:  08/28/15 88.451 kg (195 lb)  08/16/15 89.177 kg (196 lb 9.6 oz)  05/30/11 106.142 kg  (234 lb)    General:  Appears calm and comfortable Eyes: PERRL, normal lids, irises & conjunctiva ENT: grossly normal hearing, lips & tongue, his membranes of his mouth are pink slightly dry Neck: no LAD, masses or thyromegaly Cardiovascular: Tachycardia, no m/r/g. No LE edema. Telemetry: SR, no arrhythmias  Respiratory: CTA bilaterally, no w/r/r. Normal respiratory effort. Abdomen: soft, ntnd positive bowel sounds  Skin: no rash or induration seen on limited exam Musculoskeletal: grossly normal tone BUE/BLE Psychiatric: grossly normal mood and affect, speech fluent and appropriate Neurologic: grossly non-focal.          Labs on Admission:  Basic Metabolic Panel:  Recent Labs Lab 08/28/15 1304  NA 139  K 4.1  CL 105  CO2 24  GLUCOSE 86  BUN 17  CREATININE 1.24  CALCIUM 9.3   Liver Function Tests:  Recent Labs Lab 08/28/15 1304  AST 20  ALT 16*  ALKPHOS 100  BILITOT 0.7  PROT 6.1*  ALBUMIN 3.5   No results for input(s): LIPASE, AMYLASE in the last 168 hours. No results for input(s): AMMONIA in the last 168 hours. CBC:  Recent Labs Lab 08/28/15 1304  WBC 14.2*  NEUTROABS 12.7*  HGB 9.8*  HCT 31.1*  MCV 88.6  PLT 190   Cardiac Enzymes: No results for input(s): CKTOTAL, CKMB, CKMBINDEX, TROPONINI in the last 168 hours.  BNP (last 3 results) No results for input(s): BNP in the last 8760 hours.  ProBNP (last 3 results) No results for input(s): PROBNP in the last 8760 hours.  CBG: No results for input(s): GLUCAP in the last 168 hours.  Radiological Exams on Admission: No results found.  EKG: Independently reviewed sinus tach  Assessment/Plan Principal Problem:   Sepsis (HCC) Active Problems:   HTN (hypertension)   Atrial fibrillation (HCC)   Urinary tract infection   Leukocytosis   Sinus tachycardia (HCC)   UTI (lower urinary tract infection)  #1. Sepsis. Source UTI urinalysis somewhat unimpressive. Admit to telemetry. Will continue  fluid resuscitation and cycle lactic acids. Initial lactic acid 1.4. Will await urine culture. Continue Rocephin. Obtain a chest x-ray as well.  #2. Urinary retention: History of same since his surgery 4 weeks ago. Likely related to narcotic and UTI. Foley was removed on November 11 he initially was able to void. Unable to void upon presentation. Foley inserted 1 L of clear golden urine drained. Will continue Rocephin. Continue his Flomax. Likely be able to discharge in 24-36 hours with close follow-up his urologist.  2. UTI. Urinalysis is somewhat unimpressive. I will obtain a chest x-ray for completeness. Continue Rocephin. Await urine culture. See #1  #3. History of A. fib. Postop. Currently in sinus tach likely related #1. Improving with fluids. EKG at baseline. Will continue his amiodarone his request    Code Status: full DVT Prophylaxis: Family Communication: none present Disposition Plan: home likely 24-36 hrs  Time spent: 60 minutes  [facility]    I have examined the patient, reviewed the chart and discussed the plan with Toya Smothers, NP.  Sepsis secondary to UTI as a result of urinary retention. Stop narcotics. Cont foley and give voiding trial in 24-48 hrs. Cont Rocephin for now.  S/p TEE cardioversion on 10/28. Cont Apixaban.   Has systolic CHF with an EF of 35% therefore will D/c IVF. Has in increasing left pleural effusion. Currently not hypoxic but will need to consider starting an ACE and Lasix for systolic CHF based upon his BP readings.   Calvert Cantor, MD Pager: [email or web address]

## 2015-08-28 NOTE — ED Notes (Signed)
Patient in restroom since arrival trying to urinate.

## 2015-08-29 ENCOUNTER — Encounter (HOSPITAL_COMMUNITY): Payer: Self-pay | Admitting: Internal Medicine

## 2015-08-29 DIAGNOSIS — R7881 Bacteremia: Secondary | ICD-10-CM

## 2015-08-29 DIAGNOSIS — R339 Retention of urine, unspecified: Secondary | ICD-10-CM | POA: Diagnosis present

## 2015-08-29 DIAGNOSIS — I5022 Chronic systolic (congestive) heart failure: Secondary | ICD-10-CM | POA: Diagnosis present

## 2015-08-29 DIAGNOSIS — M545 Low back pain: Secondary | ICD-10-CM | POA: Diagnosis present

## 2015-08-29 DIAGNOSIS — G4700 Insomnia, unspecified: Secondary | ICD-10-CM | POA: Diagnosis present

## 2015-08-29 DIAGNOSIS — N179 Acute kidney failure, unspecified: Secondary | ICD-10-CM | POA: Diagnosis present

## 2015-08-29 DIAGNOSIS — N390 Urinary tract infection, site not specified: Secondary | ICD-10-CM | POA: Diagnosis present

## 2015-08-29 DIAGNOSIS — A419 Sepsis, unspecified organism: Principal | ICD-10-CM

## 2015-08-29 DIAGNOSIS — J948 Other specified pleural conditions: Secondary | ICD-10-CM | POA: Diagnosis not present

## 2015-08-29 DIAGNOSIS — I959 Hypotension, unspecified: Secondary | ICD-10-CM | POA: Diagnosis present

## 2015-08-29 DIAGNOSIS — I110 Hypertensive heart disease with heart failure: Secondary | ICD-10-CM | POA: Diagnosis present

## 2015-08-29 DIAGNOSIS — E876 Hypokalemia: Secondary | ICD-10-CM | POA: Diagnosis not present

## 2015-08-29 DIAGNOSIS — J9811 Atelectasis: Secondary | ICD-10-CM | POA: Diagnosis present

## 2015-08-29 DIAGNOSIS — K5190 Ulcerative colitis, unspecified, without complications: Secondary | ICD-10-CM | POA: Diagnosis present

## 2015-08-29 DIAGNOSIS — J90 Pleural effusion, not elsewhere classified: Secondary | ICD-10-CM | POA: Diagnosis present

## 2015-08-29 DIAGNOSIS — A414 Sepsis due to anaerobes: Secondary | ICD-10-CM | POA: Diagnosis not present

## 2015-08-29 DIAGNOSIS — Z8673 Personal history of transient ischemic attack (TIA), and cerebral infarction without residual deficits: Secondary | ICD-10-CM | POA: Diagnosis not present

## 2015-08-29 DIAGNOSIS — I4891 Unspecified atrial fibrillation: Secondary | ICD-10-CM | POA: Diagnosis not present

## 2015-08-29 DIAGNOSIS — B961 Klebsiella pneumoniae [K. pneumoniae] as the cause of diseases classified elsewhere: Secondary | ICD-10-CM | POA: Diagnosis present

## 2015-08-29 DIAGNOSIS — R000 Tachycardia, unspecified: Secondary | ICD-10-CM | POA: Diagnosis present

## 2015-08-29 DIAGNOSIS — I4892 Unspecified atrial flutter: Secondary | ICD-10-CM | POA: Diagnosis present

## 2015-08-29 DIAGNOSIS — R531 Weakness: Secondary | ICD-10-CM | POA: Diagnosis present

## 2015-08-29 DIAGNOSIS — E785 Hyperlipidemia, unspecified: Secondary | ICD-10-CM | POA: Diagnosis present

## 2015-08-29 HISTORY — DX: Chronic systolic (congestive) heart failure: I50.22

## 2015-08-29 LAB — BASIC METABOLIC PANEL
ANION GAP: 8 (ref 5–15)
BUN: 17 mg/dL (ref 6–20)
CALCIUM: 8.3 mg/dL — AB (ref 8.9–10.3)
CO2: 24 mmol/L (ref 22–32)
CREATININE: 1.15 mg/dL (ref 0.61–1.24)
Chloride: 107 mmol/L (ref 101–111)
Glucose, Bld: 101 mg/dL — ABNORMAL HIGH (ref 65–99)
Potassium: 4.1 mmol/L (ref 3.5–5.1)
SODIUM: 139 mmol/L (ref 135–145)

## 2015-08-29 LAB — CBC
HEMATOCRIT: 27.4 % — AB (ref 39.0–52.0)
Hemoglobin: 8.4 g/dL — ABNORMAL LOW (ref 13.0–17.0)
MCH: 27.4 pg (ref 26.0–34.0)
MCHC: 30.7 g/dL (ref 30.0–36.0)
MCV: 89.3 fL (ref 78.0–100.0)
Platelets: 154 10*3/uL (ref 150–400)
RBC: 3.07 MIL/uL — ABNORMAL LOW (ref 4.22–5.81)
RDW: 16.2 % — AB (ref 11.5–15.5)
WBC: 16 10*3/uL — AB (ref 4.0–10.5)

## 2015-08-29 LAB — HEPARIN LEVEL (UNFRACTIONATED)

## 2015-08-29 LAB — APTT: APTT: 49 s — AB (ref 24–37)

## 2015-08-29 MED ORDER — PIPERACILLIN-TAZOBACTAM 3.375 G IVPB
3.3750 g | Freq: Three times a day (TID) | INTRAVENOUS | Status: DC
  Administered 2015-08-29 – 2015-08-31 (×8): 3.375 g via INTRAVENOUS
  Filled 2015-08-29 (×10): qty 50

## 2015-08-29 MED ORDER — HEPARIN (PORCINE) IN NACL 100-0.45 UNIT/ML-% IJ SOLN
1700.0000 [IU]/h | INTRAMUSCULAR | Status: DC
  Administered 2015-08-29: 1200 [IU]/h via INTRAVENOUS
  Administered 2015-08-30: 1350 [IU]/h via INTRAVENOUS
  Administered 2015-08-31: 1700 [IU]/h via INTRAVENOUS
  Filled 2015-08-29 (×3): qty 250

## 2015-08-29 MED ORDER — FUROSEMIDE 40 MG PO TABS
40.0000 mg | ORAL_TABLET | Freq: Every day | ORAL | Status: DC
  Administered 2015-08-29 – 2015-09-01 (×4): 40 mg via ORAL
  Filled 2015-08-29 (×4): qty 1

## 2015-08-29 MED ORDER — HYDROCODONE-ACETAMINOPHEN 5-325 MG PO TABS
1.0000 | ORAL_TABLET | ORAL | Status: DC | PRN
  Administered 2015-08-30: 1 via ORAL
  Filled 2015-08-29: qty 1

## 2015-08-29 MED ORDER — ONDANSETRON HCL 4 MG/2ML IJ SOLN
4.0000 mg | Freq: Four times a day (QID) | INTRAMUSCULAR | Status: DC | PRN
  Administered 2015-08-29 – 2015-08-30 (×2): 4 mg via INTRAVENOUS
  Filled 2015-08-29 (×3): qty 2

## 2015-08-29 NOTE — Progress Notes (Signed)
Dr. Arbutus Leasat notified of oral temp 102.3; tylenol given as [facility]

## 2015-08-29 NOTE — Progress Notes (Signed)
PROGRESS NOTE  Ryan Cole ZOX:[id number] DOB: [birth year] DOA: 08/28/2015 PCP: Oliver Barre, MD  Brief history 59 year old male with a history of hyperlipidemia, hypertension, atrial flutter s/p DCCV, aortic dissection repair (08/03/15), and ulcerative colitis presents with urinary retention. The patient was recently discharged from the hospital on 08/16/2015 after aortic dissection repair by Dr. Dorris Fetch. The patient was discharged with a Foley catheter in place secondary to urinary retention. He had his Foley catheter removed at the [facility] in [place], [place].  Initially, the patient was able to urinate, but after he got home, he had difficulty urinating. The patient also complained of subjective chills. Upon presentation to emergency department, a foley catheter was placed draining 1 L of urine. The patient was also noted to have tachycardia, fever, leukocytosis. The patient was admitted for possible sepsis from a urinary source. He was initially started on ceftriaxone.  Assessment/Plan: Sepsis -Secondary to bacteremia with urinary source -Discontinue ceftriaxone -Start Zosyn -Follow up urine and blood cultures Bacteremia--GNR -Secondary to urinary source -Continue Zosyn pending culture data Pyuria -Suspect CAUTI as the patient was discharged home with a Foley catheter -Continue Zosyn pending culture data Urinary retention -Foley catheter placed in the emergency department -Continue Flomax Postoperative atrial flutter -presently in sinus s/p DCCV on 08/14/15 -Continue amiodarone -Hold metoprolol as the patient's blood pressure remains  Soft -CHADS-VASc = 4 -continue apixaban S/p Aortic dissection repair -reconsulted TCTS Chronic systolic CHF -08/16/2015 discharge weight 196 pounds -Check daily weights -Start oral furosemide as he appears to be slightly on the hypervolemic side -08/11/2015 heart catheterization--no significant CAD, EF 35-45 percent  -08/14/2015  TEE-EF 45-50 percent  Left Pleural Effusion -no hypoxemia or increased WOB -hold off thoracocentesis for now -ask TCTS for opinion as suspect this may be post-op in nature Hyperlipidemia  -Continue statin    Family Communication:   Pt at beside Disposition Plan:   Home when medically stable        Procedures/Studies: Dg Chest 2 View  08/09/2015  CLINICAL DATA:  Post thoracic aortic aneurysm repair 08/02/2015 EXAM: CHEST  2 VIEW COMPARISON:  08/07/2015 FINDINGS: Enlargement of cardiac silhouette post median sternotomy. Epicardial pacing wires noted. Stable mediastinal contours and pulmonary vascularity. Bibasilar effusions and atelectasis greater on LEFT little changed. No acute infiltrate or pneumothorax. Bones unremarkable. IMPRESSION: Persistent bibasilar at atelectasis and pleural effusions greater on LEFT, unchanged. Enlargement of cardiac silhouette. Electronically Signed   By: Ulyses Southward M.D.   On: 08/09/2015 09:12   Ct Angio Chest Pe W/cm &/or Wo Cm  08/02/2015  CLINICAL DATA:  59 yr old male with chest pain radiating to left side of jaw. Diaphoretic. Had about a week ago but didn't seek medical attention. No known heart problems. EXAM: CT ANGIOGRAPHY CHEST WITH CONTRAST TECHNIQUE: Multidetector CT imaging of the chest was performed using the standard protocol during bolus administration of intravenous contrast. Multiplanar CT image reconstructions and MIPs were obtained to evaluate the vascular anatomy. CONTRAST:  OMNIPAQUE IOHEXOL 350 MG/ML SOLN COMPARISON:  Current chest radiographs. FINDINGS: Angiographic study: No evidence of a pulmonary embolus. Aorta is not opacified. It is dilated. Ascending aorta measures 4.7 cm in diameter approximately 4 cm above the aortic valve and 5.2 cm at the aortic root. Aortic branch vessels are normal in caliber. Thoracic inlet:  No mass or adenopathy.  Thyroid unremarkable. Mediastinum and hila: Heart mildly enlarged. No mediastinal or hilar  masses or pathologically enlarged lymph nodes. Lungs  and pleura: Mild dependent lower lobe subsegmental atelectasis. No evidence of pneumonia or edema. No mass or suspicious nodule. No pleural effusion or pneumothorax. Limited upper abdomen: Probable small cyst from the upper pole left kidney. Otherwise unremarkable. Musculoskeletal: Mild degenerative spurring noted along the thoracic spine. No osteoblastic or osteolytic lesions. Review of the MIP images confirms the above findings. IMPRESSION: 1. No evidence of a pulmonary embolus. 2. 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 3. No acute findings. Lungs are clear other than mild dependent subsegmental atelectasis. Electronically Signed   By: Amie Portland M.D.   On: 08/02/2015 20:42   Dg Chest Port 1 View  08/28/2015  CLINICAL DATA:  Fever.  Tachycardia. EXAM: PORTABLE CHEST 1 VIEW COMPARISON:  08/09/2015 and multiple previous FINDINGS: Previous median sternotomy and CABG. Chronic cardiomegaly and unfolded aorta. Enlarging effusion on the left with worsened volume loss in the left lower lobe. Small effusion on the right appears similar to the previous study. IMPRESSION: Enlarging effusion on the left with worsened volume loss in the left lower lobe. There could certainly be associated pneumonia. Small effusion on the right. Electronically Signed   By: Paulina Fusi M.D.   On: 08/28/2015 17:53   Dg Chest Port 1 View  08/07/2015  CLINICAL DATA:  Aortic dissection . EXAM: PORTABLE CHEST 1 VIEW COMPARISON:  08/06/2015. FINDINGS: Interim removal right IJ sheath. Prior CABG. Cardiomegaly. Left lower lobe atelectasis and/or infiltrate. Small left pleural effusion unchanged. No pneumothorax. IMPRESSION: 1. Interval removal right IJ sheath. 2. Prior median sternotomy. Prior thoracic aneurysm repair. Mediastinum is stable. 3. Persistent left lower lobe atelectasis and/or infiltrate. Small left pleural effusion. No  interim change . Electronically Signed   By: Maisie Fus  Register   On: 08/07/2015 07:39   Dg Chest Port 1 View  08/06/2015  CLINICAL DATA:  Aortic dissection. EXAM: PORTABLE CHEST 1 VIEW COMPARISON:  08/05/2015. FINDINGS: Right IJ sheath in stable position. Interval removal of mediastinal drainage catheters. New prior median sternotomy and thoracic aneurysm repair. Mediastinum is stable. Stable cardiomegaly. Persistent left lower lobe atelectasis and/or infiltrate. Improving right base atelectasis. Small left pleural effusion. No pneumothorax. IMPRESSION: 1. Interval removal of mediastinal drainage catheters. Right IJ sheath in stable position. No pneumothorax. 2. Prior median sternotomy and thoracic aneurysm repair. Mediastinum is stable. Stable cardiomegaly. 3. Persistent left lower lobe atelectasis and/or infiltrate. Interim clearing of right base atelectasis. Small left pleural effusion . Electronically Signed   By: Maisie Fus  Register   On: 08/06/2015 07:54   Dg Chest Port 1 View  08/05/2015  CLINICAL DATA:  Status post repair of type 1 aortic dissection. EXAM: PORTABLE CHEST 1 VIEW COMPARISON:  08/04/2015 FINDINGS: Endotracheal tube, enteric tube, and Swan-Ganz catheter have been removed. Right jugular sheath remains in place with tip overlying the upper SVC. Mediastinal drains remain in place. Sternotomy wires are again noted. Cardiac silhouette is mildly enlarged. Lung volumes are slightly diminished compared to the prior study. Bibasilar lung opacities and pleural effusions have not significantly changed. No pneumothorax is identified. IMPRESSION: Interval extubation. Persistent bibasilar atelectasis and small pleural effusions. Electronically Signed   By: Sebastian Ache M.D.   On: 08/05/2015 07:44   Dg Chest Port 1 View  08/04/2015  CLINICAL DATA:  Thoracic ascending aortic aneurysm repair EXAM: PORTABLE CHEST 1 VIEW COMPARISON:  08/03/2015 FINDINGS: Endotracheal tube has been withdrawn and is now in  good position. Swan-Ganz catheter in the main pulmonary artery. Pericardial and  mediastinal drains in good position. NG tube in place. Negative for pneumothorax Increase in bibasilar atelectasis and small pleural effusions. Negative for edema. IMPRESSION: Endotracheal tube and support lines in good position Progressive bibasilar atelectasis and small pleural effusions. Electronically Signed   By: Marlan Palau M.D.   On: 08/04/2015 08:10   Dg Chest Port 1 View  08/03/2015  CLINICAL DATA:  59 year old male with a history of type 1 aortic dissection repair EXAM: PORTABLE CHEST 1 VIEW COMPARISON:  Plain film 08/03/2015, CT 08/02/2015 FINDINGS: Early postoperative changes of type 1 aortic dissection repair. Slight right rotation the patient. There is slightly increased diameter of the upper mediastinum on the current plain film compared to the prior CT, which may be secondary to patient positioning. Unchanged position of endotracheal tube, which terminates just above the carina and is directed towards the right mainstem. At least 3 mediastinal drains/pleural drains project over the mediastinum. Gastric tube terminates within the left upper quadrant, with the side port at the gastroesophageal junction. Unchanged position of the right IJ approach sheath, through which a Swan-Ganz catheter terminates in the region of the pulmonary outflow tract. Surgical changes of median sternotomy. Lung volumes are low with ill-defined opacities at the left greater than right base. IMPRESSION: Early postoperative changes of median sternotomy and a type 1 dissection repair. The superior mediastinum is slightly widened compared to the recent plain film comparison, though this may be secondary to slight right rotation. If there is concern for acute abnormality, correlation with either cardiac echo or contrast-enhanced CT may be useful. Endotracheal tube terminates just above the carina directed towards the right mainstem. Withdrawal of  3 cm to 7 cm may be useful for better position. Unchanged position of multiple mediastinal/ pleural drains, right IJ sheath through which a Swan-Ganz catheter is transmitted, and gastric tube. Atelectasis at the left base. These results were called by telephone at the time of interpretation on 08/03/2015 at 3:42 pm to the nurse caring for the patient, Ms Louie Bun, who verbally acknowledged these results. Electronically Signed   By: Gilmer Mor D.O.   On: 08/03/2015 15:42   Dg Chest Port 1 View  08/03/2015  CLINICAL DATA:  Postop CABG evaluate for suction instrument 4" long x 0.75" wide EXAM: PORTABLE CHEST 1 VIEW COMPARISON:  08/02/2015 FINDINGS: Swan-Ganz central venous catheter with tip in the right main pulmonary artery. Endotracheal tube with tip about 5 mm above the carina. There are two left chest tubes and a mediastinal drain. There is a nasogastric tube extending off the inferior edge of the film with the side hole over the distal esophagus. There is postoperative hazy attenuation at both lung bases likely representing atelectasis. No evidence postoperative edema or pneumothorax. There is no evidence of the described suction device. IMPRESSION: Two left chest tubes, mediastinal drain, NG tube, ET tube, and swan ganz line. Electronically Signed   By: Esperanza Heir M.D.   On: 08/03/2015 10:07   Dg Chest Port 1 View  08/02/2015  CLINICAL DATA:  Chest pain starting today EXAM: PORTABLE CHEST 1 VIEW COMPARISON:  08/25/2007 FINDINGS: The heart size and mediastinal contours are within normal limits. Both lungs are clear. The visualized skeletal structures are unremarkable. IMPRESSION: No active disease. Electronically Signed   By: Natasha Mead M.D.   On: 08/02/2015 17:36         Subjective: Patient denies fevers, chills, headache, chest pain, dyspnea, nausea, vomiting, diarrhea, abdominal pain, dysuria, hematuria. Denies any hematochezia or melena. Denies  any dizziness or syncope. No rashes  or neck pain.    Objective: Filed Vitals:   08/29/15 0004 08/29/15 0206 08/29/15 0609 08/29/15 0913  BP: 108/60 134/67 97/61 102/60  Pulse: 95 107 104 99  Temp: 98.7 F (37.1 C) 98.4 F (36.9 C) 98.7 F (37.1 C) 99 F (37.2 C)  TempSrc: Oral Oral Oral Oral  Resp: 16 16 17 18   Height:      Weight:      SpO2: 100% 100% 96% 100%    Intake/Output Summary (Last 24 hours) at 08/29/15 1001 Last data filed at 08/29/15 0914  Gross per 24 hour  Intake 3983.33 ml  Output   1502 ml  Net 2481.33 ml   Weight change:  Exam:   General:  Pt is alert, follows commands appropriately, not in acute distress  HEENT: No icterus, No thrush, No neck mass, Nelson/AT  Cardiovascular: RRR, S1/S2, no rubs, no gallops  Respiratory: diminished breath sounds left base. Clear to auscultation on the right.   Abdomen: Soft/+BS, non tender, non distended, no guarding; no hepatosplenomegaly  Extremities: 1+ LE edema, No lymphangitis, No petechiae, No rashes, no synovitis; no cyanosis or clubbing   Data Reviewed: Basic Metabolic Panel:  Recent Labs Lab 08/28/15 1304 08/29/15 0605  NA 139 139  K 4.1 4.1  CL 105 107  CO2 24 24  GLUCOSE 86 101*  BUN 17 17  CREATININE 1.24 1.15  CALCIUM 9.3 8.3*   Liver Function Tests:  Recent Labs Lab 08/28/15 1304  AST 20  ALT 16*  ALKPHOS 100  BILITOT 0.7  PROT 6.1*  ALBUMIN 3.5   No results for input(s): LIPASE, AMYLASE in the last 168 hours. No results for input(s): AMMONIA in the last 168 hours. CBC:  Recent Labs Lab 08/28/15 1304 08/29/15 0605  WBC 14.2* 16.0*  NEUTROABS 12.7*  --   HGB 9.8* 8.4*  HCT 31.1* 27.4*  MCV 88.6 89.3  PLT 190 154   Cardiac Enzymes: No results for input(s): CKTOTAL, CKMB, CKMBINDEX, TROPONINI in the last 168 hours. BNP: Invalid input(s): POCBNP CBG: No results for input(s): GLUCAP in the last 168 hours.  Recent Results (from the past 240 hour(s))  Blood Culture (routine x 2)     Status: None  (Preliminary result)   Collection Time: 08/28/15  1:20 PM  Result Value Ref Range Status   Specimen Description BLOOD RIGHT ARM  Final   Special Requests BOTTLES DRAWN AEROBIC ONLY 5CC  Final   Culture  Setup Time   Final    GRAM NEGATIVE RODS AEROBIC BOTTLE ONLY CRITICAL RESULT CALLED TO, READ BACK BY AND VERIFIED WITH: K HASTINGS,RN AT 1610 08/29/15 BY L BENFIELD    Culture GRAM NEGATIVE RODS  Final   Report Status PENDING  Incomplete  Blood Culture (routine x 2)     Status: None (Preliminary result)   Collection Time: 08/28/15  1:26 PM  Result Value Ref Range Status   Specimen Description BLOOD LEFT HAND  Final   Special Requests BOTTLES DRAWN AEROBIC ONLY 5CC  Final   Culture  Setup Time   Final    GRAM NEGATIVE RODS AEROBIC BOTTLE ONLY CRITICAL RESULT CALLED TO, READ BACK BY AND VERIFIED WITH: SABITA @0450  08/29/15 MKELLY    Culture PENDING  Incomplete   Report Status PENDING  Incomplete     Scheduled Meds: . amiodarone  200 mg Oral Daily  . apixaban  5 mg Oral BID  . aspirin EC  81 mg Oral  Daily  . atorvastatin  80 mg Oral q1800  . ferrous sulfate  325 mg Oral Q breakfast  . mesalamine  2.4 g Oral Daily  . multivitamin with minerals  1 tablet Oral Daily  . piperacillin-tazobactam  3.375 g Intravenous 3 times per day  . tamsulosin  0.4 mg Oral QPC breakfast   Continuous Infusions:    Lamika Connolly, DO  [facility] Pager [phone]  If 7PM-7AM, please contact night-coverage [email or web address] Password [email or web address] 08/29/2015, 10:01 AM   LOS: 1 day

## 2015-08-29 NOTE — Progress Notes (Addendum)
ANTICOAGULATION CONSULT NOTE - Initial Consult  Pharmacy Consult for heparin Indication: atrial fibrillation  No Known Allergies  Patient Measurements: Height: 6' (182.9 cm) Weight: 196 lb 8 oz (89.132 kg) IBW/kg (Calculated) : 77.6 Heparin Dosing Weight: 89 kg  Vital Signs: Temp: 102.3 F (39.1 C) (11/12 1831) Temp Source: Oral (11/12 1831) BP: 109/60 mmHg (11/12 1831) Pulse Rate: 109 (11/12 1831)  Labs:  Recent Labs  08/28/15 1304 08/28/15 1745 08/29/15 0605  HGB 9.8*  --  8.4*  HCT 31.1*  --  27.4*  PLT 190  --  154  APTT  --  37  --   LABPROT  --  17.8*  --   INR  --  1.45  --   CREATININE 1.24  --  1.15    Estimated Creatinine Clearance: 76.9 mL/min (by C-G formula based on Cr of 1.15).   Medical History: Past Medical History  Diagnosis Date  . 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 HYPERLIPIDEMIA 06/08/2007  . HYPOGONADISM, MALE 10/10/2007  . INSOMNIA-SLEEP DISORDER-UNSPEC 10/10/2007  . LOW BACK PAIN 06/08/2007  . WEAKNESS, LEFT SIDE OF BODY 10/10/2007  . Hypertension   . Chronic systolic CHF (congestive heart failure) (HCC) 08/29/2015    Medications:  Prescriptions prior to admission  Medication Sig Dispense Refill Last Dose  . acetaminophen (TYLENOL) 500 MG tablet Take 2 tablets (1,000 mg total) by mouth every 6 (six) hours as needed for mild pain. 30 tablet 0 Past Month at Unknown time  . amiodarone (PACERONE) 200 MG tablet Take 1 tablet (200 mg total) by mouth 2 (two) times daily. For one week;then take Amiodarone 200 mg daily by mouth thereafter (Patient taking differently: Take 200 mg by mouth daily. ) 30 tablet 1 08/27/2015 at Unknown time  . apixaban (ELIQUIS) 5 MG TABS tablet Take 1 tablet (5 mg total) by mouth 2 (two) times daily. 60 tablet 1 08/27/2015 at 2100  . aspirin EC 81 MG EC tablet Take 1 tablet (81 mg total) by mouth daily.   08/27/2015 at Unknown time  . atorvastatin (LIPITOR) 80 MG tablet Take 1 tablet  (80 mg total) by mouth daily at 6 PM. 30 tablet 1 08/27/2015 at Unknown time  . ferrous sulfate 325 (65 FE) MG EC tablet Take 325 mg by mouth daily with breakfast.   08/27/2015 at Unknown time  . mesalamine (APRISO) 0.375 G 24 hr capsule Take 1,500 mg by mouth daily.   08/27/2015 at Unknown time  . metoprolol tartrate (LOPRESSOR) 25 MG tablet Take 1 tablet (25 mg total) by mouth 2 (two) times daily. (Patient taking differently: Take 25 mg by mouth 2 (two) times daily as needed. For high readings of blood pressure) 60 tablet 1 unknown at unknown  . Multiple Vitamin (MULTIVITAMIN WITH MINERALS) TABS tablet Take 1 tablet by mouth daily.   08/27/2015 at Unknown time  . oxyCODONE (OXY IR/ROXICODONE) 5 MG immediate release tablet Take 1-2 tablets (5-10 mg total) by mouth every 4 (four) hours as needed for severe pain. 30 tablet 0 08/27/2015 at Unknown time  . tamsulosin (FLOMAX) 0.4 MG CAPS capsule Take 1 capsule (0.4 mg total) by mouth daily after breakfast. 30 capsule 1 08/27/2015 at Unknown time  . Melatonin 3 MG TABS Take 6-9 mg by mouth at bedtime as needed (FOR SLEEP).   unknown at unknown    Assessment: 59 y/o male who presented to the ED unable to urinate for 24 hrs. He recently had repair of  a type 1 aortic dissection. He has a L pleural effusion and needs a thoracentesis. Pharmacy consulted to transition from apixaban to IV heparin for Afib. Last apixaban dose was this morning at 08:21. His renal function is normal. Heparin will be adjusted by aPTTs until the aPTT and heparin level correlate due to apixaban affecting the heparin level lab value. No bleeding noted, Hb is low at 8.4, platelets are low normal.   Goal of Therapy:  Heparin level 0.3-0.7 units/ml Monitor platelets by anticoagulation protocol: Yes   Plan:  - Check baseline aPTT and heparin level - Heparin drip IV at 1200 units/hr - to begin at 21:00 - 6 hr heparin level and aPTT - Daily heparin level, aPTT, and CBC - Monitor for  s/sx of bleeding  [facility], [place].D., BCPS Clinical Pharmacist Pager: [phone] 08/29/2015 7:01 PM   Addendum: Baseline aPTT is 49 and heparin level >2.2. As expected, will need to adjust heparin based on aPTTs.  [place], [address].D., BCPS Clinical Pharmacist Pager: [phone] 08/29/2015 9:01 PM

## 2015-08-29 NOTE — Progress Notes (Signed)
      [address]       [place], [id number]             [phone]      Asked to see Mr. Timberman re: left pleural effusion  He is a 59 yo man I know well from treating him recently for a type I aortic dissection. He had issues with urinary retention postop and was dc'ed with a Foley catheter. That [facility]. Initially he was able to void but had recurrent urinary retention. He was admitted with chills, tachycardia and urinary retention. Being treated for urosepsis.  On exam his chest incision is healing well He has a murmur with both systolic and diastolic components(unchanged from previous) He has diminished BS in the left base  CXR shows a left pleural effusion with associated LLL atelectasis.  I would favor having an elective ultrasound guided thoracentesis done on the left effusion. This is most likely an inflammatory postop effusion. If not for the UTI I would treat with prednisone, but given the UTI I think drainage would be the best option. It is not urgent and could be done on Monday  Viviann SpareSteven C. Dorris FetchHendrickson, MD [facility] [phone]

## 2015-08-29 NOTE — Progress Notes (Signed)
CRITICAL VALUE ALERT  Critical value received:  Gram neg rod in aerobic bottle   Date of notification:  08/29/15  Time of notification:  0451  Critical value read back:Yes.    Nurse who received alert:  Lendell CapriceSabita Ether Goebel, RN Claiborne Billingsallahan MD notified (1st page):  T , NP  Time of first page:  [phone]  MD notified (2nd page):  Time of second page:  Responding MD: Benedetto Coons Callahan, NP  Time MD responded:  [phone] Pt is on IV Rocephin. No new orders received at this time.

## 2015-08-29 NOTE — Progress Notes (Signed)
Appreciate Dr. Sunday CornHendrickson's consult.   Hold apixaban and start heparin in preparation for thoracocentesis Continue zosyn.  If persistent fever, will need further workup.  DTat

## 2015-08-29 NOTE — Progress Notes (Signed)
Pt c/o stomach bloated, unrelieved by IV zofran. LBM is  today . On-call provider Benedetto Coons Callahan, NP paged . Awaiting orders.

## 2015-08-29 NOTE — Progress Notes (Signed)
Critical value received: Gram neg rod in aerobic bottle   Date of notification: 08/29/15  Time of notification: 0830  Critical value read back:Yes.   Nurse who received alert:Quana Chamberlain Aileen PilotHastings, RN  MD notified (1st page): Dr. Arbutus Leasat  Time of first page: 0830 MD notified (2nd page):  Time of second page:  Responding MD: Dr. Arbutus Leasat  Time MD responded: 0830 Pt is on IV Zosyn

## 2015-08-30 ENCOUNTER — Inpatient Hospital Stay (HOSPITAL_COMMUNITY): Payer: Non-veteran care

## 2015-08-30 DIAGNOSIS — J90 Pleural effusion, not elsewhere classified: Secondary | ICD-10-CM | POA: Insufficient documentation

## 2015-08-30 DIAGNOSIS — A414 Sepsis due to anaerobes: Secondary | ICD-10-CM

## 2015-08-30 DIAGNOSIS — N179 Acute kidney failure, unspecified: Secondary | ICD-10-CM

## 2015-08-30 DIAGNOSIS — R7881 Bacteremia: Secondary | ICD-10-CM

## 2015-08-30 DIAGNOSIS — J948 Other specified pleural conditions: Secondary | ICD-10-CM

## 2015-08-30 LAB — CBC
HCT: 26.7 % — ABNORMAL LOW (ref 39.0–52.0)
Hemoglobin: 8.3 g/dL — ABNORMAL LOW (ref 13.0–17.0)
MCH: 27.5 pg (ref 26.0–34.0)
MCHC: 31.1 g/dL (ref 30.0–36.0)
MCV: 88.4 fL (ref 78.0–100.0)
PLATELETS: 142 10*3/uL — AB (ref 150–400)
RBC: 3.02 MIL/uL — ABNORMAL LOW (ref 4.22–5.81)
RDW: 16.1 % — AB (ref 11.5–15.5)
WBC: 11.4 10*3/uL — AB (ref 4.0–10.5)

## 2015-08-30 LAB — BASIC METABOLIC PANEL
Anion gap: 8 (ref 5–15)
BUN: 15 mg/dL (ref 6–20)
CHLORIDE: 106 mmol/L (ref 101–111)
CO2: 23 mmol/L (ref 22–32)
Calcium: 8.1 mg/dL — ABNORMAL LOW (ref 8.9–10.3)
Creatinine, Ser: 1.39 mg/dL — ABNORMAL HIGH (ref 0.61–1.24)
GFR calc non Af Amer: 54 mL/min — ABNORMAL LOW (ref >60)
Glucose, Bld: 130 mg/dL — ABNORMAL HIGH (ref 65–99)
POTASSIUM: 3.3 mmol/L — AB (ref 3.5–5.1)
SODIUM: 137 mmol/L (ref 135–145)

## 2015-08-30 LAB — APTT
APTT: 64 s — AB (ref 24–37)
aPTT: 63 seconds — ABNORMAL HIGH (ref 24–37)
aPTT: 63 seconds — ABNORMAL HIGH (ref 24–37)

## 2015-08-30 LAB — URINE CULTURE

## 2015-08-30 LAB — HEPARIN LEVEL (UNFRACTIONATED)
Heparin Unfractionated: 1.68 IU/mL — ABNORMAL HIGH (ref 0.30–0.70)
Heparin Unfractionated: 1.02 IU/mL — ABNORMAL HIGH (ref 0.30–0.70)

## 2015-08-30 MED ORDER — ALUM & MAG HYDROXIDE-SIMETH 200-200-20 MG/5ML PO SUSP
30.0000 mL | Freq: Once | ORAL | Status: AC
  Administered 2015-08-30: 30 mL via ORAL
  Filled 2015-08-30: qty 30

## 2015-08-30 MED ORDER — POTASSIUM CHLORIDE CRYS ER 20 MEQ PO TBCR
40.0000 meq | EXTENDED_RELEASE_TABLET | Freq: Every day | ORAL | Status: DC
  Administered 2015-08-30 – 2015-09-01 (×3): 40 meq via ORAL
  Filled 2015-08-30 (×3): qty 2

## 2015-08-30 NOTE — Progress Notes (Signed)
ANTICOAGULATION CONSULT NOTE  Pharmacy Consult for heparin Indication: atrial fibrillation  No Known Allergies  Patient Measurements: Height: 6' (182.9 cm) Weight: 196 lb 8 oz (89.132 kg) IBW/kg (Calculated) : 77.6 Heparin Dosing Weight: 89 kg  Vital Signs: Temp: 99.5 F (37.5 C) (11/12 2202) Temp Source: Oral (11/12 2202) BP: 93/56 mmHg (11/12 2202) Pulse Rate: 95 (11/12 2202)  Labs:  Recent Labs  08/28/15 1304 08/28/15 1745 08/29/15 0605 08/29/15 1940 08/30/15 0444  HGB 9.8*  --  8.4*  --  8.3*  HCT 31.1*  --  27.4*  --  26.7*  PLT 190  --  154  --  142*  APTT  --  37  --  49* 64*  LABPROT  --  17.8*  --   --   --   INR  --  1.45  --   --   --   HEPARINUNFRC  --   --   --  >2.20*  --   CREATININE 1.24  --  1.15  --   --     Estimated Creatinine Clearance: 76.9 mL/min (by C-G formula based on Cr of 1.15).  Assessment: 59 y/o male with h/o Afib, Eliquis on hold, for heparin   Goal of Therapy:  APTT 66-102 while apixaban interfering with anti-Xa level Heparin level 0.3-0.7 units/ml Monitor platelets by anticoagulation protocol: Yes   Plan:  Increase Heparin 1350 units/hr APTT in 8 hrs  Geannie RisenGreg Vineeth Fell, PharmD, BCPS

## 2015-08-30 NOTE — Progress Notes (Signed)
ANTICOAGULATION CONSULT NOTE  Pharmacy Consult for heparin Indication: atrial fibrillation  No Known Allergies  Patient Measurements: Height: 6' (182.9 cm) Weight: 199 lb 9.6 oz (90.538 kg) IBW/kg (Calculated) : 77.6 Heparin Dosing Weight: 89 kg  Vital Signs: Temp: 100 F (37.8 C) (11/13 0550) Temp Source: Oral (11/13 0550) BP: 109/59 mmHg (11/13 0550) Pulse Rate: 96 (11/13 0550)  Labs:  Recent Labs  08/28/15 1304  08/28/15 1745 08/29/15 0605 08/29/15 1940 08/30/15 0444 08/30/15 1341  HGB 9.8*  --   --  8.4*  --  8.3*  --   HCT 31.1*  --   --  27.4*  --  26.7*  --   PLT 190  --   --  154  --  142*  --   APTT  --   < > 37  --  49* 64* 63*  LABPROT  --   --  17.8*  --   --   --   --   INR  --   --  1.45  --   --   --   --   HEPARINUNFRC  --   --   --   --  >2.20* 1.68*  --   CREATININE 1.24  --   --  1.15  --  1.39*  --   < > = values in this interval not displayed.  Estimated Creatinine Clearance: 63.6 mL/min (by C-G formula based on Cr of 1.39).  Assessment: 59 y/o male with h/o Afib, Eliquis on hold, for heparin. Apixaban interfering with HL.  HL remains elevated this AM at 1.68. Dosing by aPTT with goal of 66-102. APTT @ 1400 = 64 with current drip rate of 1350 units/hr. Hgb 8.3, plt 142. No reported bleeding.  Goal of Therapy:  APTT 66-102 while apixaban interfering with anti-Xa level Heparin level 0.3-0.7 units/ml Monitor platelets by anticoagulation protocol: Yes   Plan:  ncrease heparin drip to 1500 units/hr 6 hr heparin level and aPTT Daily heparin level, aPTT, and CBC Monitor for s/sx of bleeding  Sherle Poeob Vincent, PharmD PGY1 Resident Pager [phone] 08/30/2015 3:06PM

## 2015-08-30 NOTE — Progress Notes (Addendum)
PROGRESS NOTE  Ryan Cole MRN:[id number] DOB: 12/23/1955 DOA: 08/28/2015 PCP: James John, MD  Brief history 59 year old male with a history of hyperlipidemia, hypertension, atrial flutter s/p DCCV, aortic dissection repair (08/03/15), and ulcerative colitis presents with urinary retention. The patient was recently discharged from the hospital on 08/16/2015 after aortic dissection repair by Dr. Hendrickson. The patient was discharged with a Foley catheter in place secondary to urinary retention. [facility] [place] [place]. Initially, the patient was able to urinate, but after he got home, he had difficulty urinating. The patient also complained of subjective chills. Upon presentation to emergency department, a foley catheter was placed draining 1 L of urine. The patient was also noted to have tachycardia, fever, leukocytosis. The patient was admitted for possible sepsis from a urinary source. He was initially started on ceftriaxone which was changed to Zosyn pending culture data.  Assessment/Plan: Sepsis -Secondary to bacteremia with urinary source -Discontinue ceftriaxone -Continue Zosyn -Follow up urine and blood cultures Bacteremia--GNR -Secondary to urinary source -Continue Zosyn pending culture data Complicated UTI--Klebsiella -Suspect CAUTI as the patient was discharged home with a Foley catheter -Continue Zosyn Urinary retention -Foley catheter placed in the emergency department -Continue Flomax Postoperative atrial flutter -presently in sinus s/p DCCV on 08/14/15 -Continue amiodarone -Hold metoprolol as the patient's blood pressure remainsSoft -CHADS-VASc = 4 -change to heparin in preparation for thoracocentesis S/p Aortic dissection repair -reconsulted TCTS -appreciate Dr. Hendrickson Chronic systolic CHF -08/16/2015 discharge weight 196 pounds -Check daily weights -Start oral furosemide as he appears to be slightly  on the hypervolemic side -08/11/2015 heart catheterization--no significant CAD, EF 35-45 percent  -08/14/2015 TEE-EF 45-50 percent  Left Pleural Effusion -no hypoxemia or increased WOB -hold off thoracocentesis for now -appreciate Dr. Hendrickson -plan for thoracocentesis on 11/14 AKI -renal us -likely sepsis and diuresis Hyperlipidemia  -Continue statin  Hypokalemia -Replete -Check magnesium  Family Communication: Pt at beside Disposition Plan: Home  2-3 days     Procedures/Studies: Dg Chest 2 View  08/09/2015  CLINICAL DATA:  Post thoracic aortic aneurysm repair 08/02/2015 EXAM: CHEST  2 VIEW COMPARISON:  08/07/2015 FINDINGS: Enlargement of cardiac silhouette post median sternotomy. Epicardial pacing wires noted. Stable mediastinal contours and pulmonary vascularity. Bibasilar effusions and atelectasis greater on LEFT little changed. No acute infiltrate or pneumothorax. Bones unremarkable. IMPRESSION: Persistent bibasilar at atelectasis and pleural effusions greater on LEFT, unchanged. Enlargement of cardiac silhouette. Electronically Signed   By: Mark  Boles M.D.   On: 08/09/2015 09:12   Ct Angio Chest Pe W/cm &/or Wo Cm  08/02/2015  CLINICAL DATA:  58 yr old male with chest pain radiating to left side of jaw. Diaphoretic. Had about a week ago but didn't seek medical attention. No known heart problems. EXAM: CT ANGIOGRAPHY CHEST WITH CONTRAST TECHNIQUE: Multidetector CT imaging of the chest was performed using the standard protocol during bolus administration of intravenous contrast. Multiplanar CT image reconstructions and MIPs were obtained to evaluate the vascular anatomy. CONTRAST:  <[email or web address] Ho[facility] HospitalMarylandspHovnanian EnterprisesMG/ML SOLN COMPARISON:  Current chest radiographs. FINDINGS: Angiographic study: No evidence of a pulmonary embolus. Aorta is not opacified. It is dilated. Ascending aorta measures 4.7 cm in diameter approximately 4 cm above the aortic valve and 5.2 cm at the aortic  root. Aortic branch vessels are normal in caliber. Thoracic inlet:  No mass or adenopathy.  Thyroid unremarkable. Mediastinum and hila: Heart mildly enlarged. No  mediastinal or hilar masses or pathologically enlarged lymph nodes. Lungs and pleura: Mild dependent lower lobe subsegmental atelectasis. No evidence of pneumonia or edema. No mass or suspicious nodule. No pleural effusion or pneumothorax. Limited upper abdomen: Probable small cyst from the upper pole left kidney. Otherwise unremarkable. Musculoskeletal: Mild degenerative spurring noted along the thoracic spine. No osteoblastic or osteolytic lesions. Review of the MIP images confirms the above findings. IMPRESSION: 1. No evidence of a pulmonary embolus. 2. 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 3. No acute findings. Lungs are clear other than mild dependent subsegmental atelectasis. Electronically Signed   By: Amie Portland M.D.   On: 08/02/2015 20:42   Dg Chest Port 1 View  08/28/2015  CLINICAL DATA:  Fever.  Tachycardia. EXAM: PORTABLE CHEST 1 VIEW COMPARISON:  08/09/2015 and multiple previous FINDINGS: Previous median sternotomy and CABG. Chronic cardiomegaly and unfolded aorta. Enlarging effusion on the left with worsened volume loss in the left lower lobe. Small effusion on the right appears similar to the previous study. IMPRESSION: Enlarging effusion on the left with worsened volume loss in the left lower lobe. There could certainly be associated pneumonia. Small effusion on the right. Electronically Signed   By: Paulina Fusi M.D.   On: 08/28/2015 17:53   Dg Chest Port 1 View  08/07/2015  CLINICAL DATA:  Aortic dissection . EXAM: PORTABLE CHEST 1 VIEW COMPARISON:  08/06/2015. FINDINGS: Interim removal right IJ sheath. Prior CABG. Cardiomegaly. Left lower lobe atelectasis and/or infiltrate. Small left pleural effusion unchanged. No pneumothorax. IMPRESSION: 1. Interval removal right IJ  sheath. 2. Prior median sternotomy. Prior thoracic aneurysm repair. Mediastinum is stable. 3. Persistent left lower lobe atelectasis and/or infiltrate. Small left pleural effusion. No interim change . Electronically Signed   By: Maisie Fus  Register   On: 08/07/2015 07:39   Dg Chest Port 1 View  08/06/2015  CLINICAL DATA:  Aortic dissection. EXAM: PORTABLE CHEST 1 VIEW COMPARISON:  08/05/2015. FINDINGS: Right IJ sheath in stable position. Interval removal of mediastinal drainage catheters. New prior median sternotomy and thoracic aneurysm repair. Mediastinum is stable. Stable cardiomegaly. Persistent left lower lobe atelectasis and/or infiltrate. Improving right base atelectasis. Small left pleural effusion. No pneumothorax. IMPRESSION: 1. Interval removal of mediastinal drainage catheters. Right IJ sheath in stable position. No pneumothorax. 2. Prior median sternotomy and thoracic aneurysm repair. Mediastinum is stable. Stable cardiomegaly. 3. Persistent left lower lobe atelectasis and/or infiltrate. Interim clearing of right base atelectasis. Small left pleural effusion . Electronically Signed   By: Maisie Fus  Register   On: 08/06/2015 07:54   Dg Chest Port 1 View  08/05/2015  CLINICAL DATA:  Status post repair of type 1 aortic dissection. EXAM: PORTABLE CHEST 1 VIEW COMPARISON:  08/04/2015 FINDINGS: Endotracheal tube, enteric tube, and Swan-Ganz catheter have been removed. Right jugular sheath remains in place with tip overlying the upper SVC. Mediastinal drains remain in place. Sternotomy wires are again noted. Cardiac silhouette is mildly enlarged. Lung volumes are slightly diminished compared to the prior study. Bibasilar lung opacities and pleural effusions have not significantly changed. No pneumothorax is identified. IMPRESSION: Interval extubation. Persistent bibasilar atelectasis and small pleural effusions. Electronically Signed   By: Sebastian Ache M.D.   On: 08/05/2015 07:44   Dg Chest Port 1  View  08/04/2015  CLINICAL DATA:  Thoracic ascending aortic aneurysm repair EXAM: PORTABLE CHEST 1 VIEW COMPARISON:  08/03/2015 FINDINGS: Endotracheal tube has been withdrawn and is now in good  position. Swan-Ganz catheter in the main pulmonary artery. Pericardial and mediastinal drains in good position. NG tube in place. Negative for pneumothorax Increase in bibasilar atelectasis and small pleural effusions. Negative for edema. IMPRESSION: Endotracheal tube and support lines in good position Progressive bibasilar atelectasis and small pleural effusions. Electronically Signed   By: Marlan Palau M.D.   On: 08/04/2015 08:10   Dg Chest Port 1 View  08/03/2015  CLINICAL DATA:  59 year old male with a history of type 1 aortic dissection repair EXAM: PORTABLE CHEST 1 VIEW COMPARISON:  Plain film 08/03/2015, CT 08/02/2015 FINDINGS: Early postoperative changes of type 1 aortic dissection repair. Slight right rotation the patient. There is slightly increased diameter of the upper mediastinum on the current plain film compared to the prior CT, which may be secondary to patient positioning. Unchanged position of endotracheal tube, which terminates just above the carina and is directed towards the right mainstem. At least 3 mediastinal drains/pleural drains project over the mediastinum. Gastric tube terminates within the left upper quadrant, with the side port at the gastroesophageal junction. Unchanged position of the right IJ approach sheath, through which a Swan-Ganz catheter terminates in the region of the pulmonary outflow tract. Surgical changes of median sternotomy. Lung volumes are low with ill-defined opacities at the left greater than right base. IMPRESSION: Early postoperative changes of median sternotomy and a type 1 dissection repair. The superior mediastinum is slightly widened compared to the recent plain film comparison, though this may be secondary to slight right rotation. If there is concern for acute  abnormality, correlation with either cardiac echo or contrast-enhanced CT may be useful. Endotracheal tube terminates just above the carina directed towards the right mainstem. Withdrawal of 3 cm to 7 cm may be useful for better position. Unchanged position of multiple mediastinal/ pleural drains, right IJ sheath through which a Swan-Ganz catheter is transmitted, and gastric tube. Atelectasis at the left base. These results were called by telephone at the time of interpretation on 08/03/2015 at 3:42 pm to the nurse caring for the patient, Ms Louie Bun, who verbally acknowledged these results. Electronically Signed   By: Gilmer Mor D.O.   On: 08/03/2015 15:42   Dg Chest Port 1 View  08/03/2015  CLINICAL DATA:  Postop CABG evaluate for suction instrument 4" long x 0.75" wide EXAM: PORTABLE CHEST 1 VIEW COMPARISON:  08/02/2015 FINDINGS: Swan-Ganz central venous catheter with tip in the right main pulmonary artery. Endotracheal tube with tip about 5 mm above the carina. There are two left chest tubes and a mediastinal drain. There is a nasogastric tube extending off the inferior edge of the film with the side hole over the distal esophagus. There is postoperative hazy attenuation at both lung bases likely representing atelectasis. No evidence postoperative edema or pneumothorax. There is no evidence of the described suction device. IMPRESSION: Two left chest tubes, mediastinal drain, NG tube, ET tube, and swan ganz line. Electronically Signed   By: Esperanza Heir M.D.   On: 08/03/2015 10:07   Dg Chest Port 1 View  08/02/2015  CLINICAL DATA:  Chest pain starting today EXAM: PORTABLE CHEST 1 VIEW COMPARISON:  08/25/2007 FINDINGS: The heart size and mediastinal contours are within normal limits. Both lungs are clear. The visualized skeletal structures are unremarkable. IMPRESSION: No active disease. Electronically Signed   By: Natasha Mead M.D.   On: 08/02/2015 17:36         Subjective: Patient is  feeling better today. Denies any fevers, chills, chest  pain, suspect nausea, vomiting, diarrhea, abdominal pain, dysuria, hematuria. No rashes. No headache or dizziness.  Objective: Filed Vitals:   08/29/15 1831 08/29/15 2202 08/30/15 0550 08/30/15 0618  BP: 109/60 93/56 109/59   Pulse: 109 95 96   Temp: 102.3 F (39.1 C) 99.5 F (37.5 C) 100 F (37.8 C)   TempSrc: Oral Oral Oral   Resp: Height:      Weight:    90.538 kg (199 lb 9.6 oz)  SpO2: 100% 98% 98%     Intake/Output Summary (Last 24 hours) at 08/30/15 1442 Last data filed at 08/30/15 1320  Gross per 24 hour  Intake 752.35 ml  Output   1330 ml  Net -577.65 ml   Weight change: 2.087 kg (4 lb 9.6 oz) Exam:   General:  Pt is alert, follows commands appropriately, not in acute distress  HEENT: No icterus, No thrush, No neck mass, [place]/AT  Cardiovascular: RRR, S1/S2, no rubs, no gallops  Respiratory: Diminished breath sounds bilateral, left greater than right. No wheezing.  Abdomen: Soft/+BS, non tender, non distended, no guarding; no hepatosplenomegaly  Extremities: No edema, No lymphangitis, No petechiae, No rashes, no synovitis; no cyanosis or clubbing  Data Reviewed: Basic Metabolic Panel:  Recent Labs Lab 08/28/15 1304 08/29/15 0605 08/30/15 0444  NA 139 139 137  K 4.1 4.1 3.3*  CL 105 107 106  CO2 GLUCOSE 86 101* 130*  BUN CREATININE 1.24 1.15 1.39*  CALCIUM 9.3 8.3* 8.1*   Liver Function Tests:  Recent Labs Lab 08/28/15 1304  AST 20  ALT 16*  ALKPHOS 100  BILITOT 0.7  PROT 6.1*  ALBUMIN 3.5   No results for input(s): LIPASE, AMYLASE in the last 168 hours. No results for input(s): AMMONIA in the last 168 hours. CBC:  Recent Labs Lab 08/28/15 1304 08/29/15 0605 08/30/15 0444  WBC 14.2* 16.0* 11.4*  NEUTROABS 12.7*  --   --   HGB 9.8* 8.4* 8.3*  HCT 31.1* 27.4* 26.7*  MCV 88.6 89.3 88.4  PLT 190 154 142*   Cardiac Enzymes: No results for  input(s): CKTOTAL, CKMB, CKMBINDEX, TROPONINI in the last 168 hours. BNP: Invalid input(s): POCBNP CBG: No results for input(s): GLUCAP in the last 168 hours.  Recent Results (from the past 240 hour(s))  Urine culture     Status: None   Collection Time: 08/28/15 11:37 AM  Result Value Ref Range Status   Specimen Description URINE, RANDOM  Final   Special Requests NONE  Final   Culture >=100,000 COLONIES/mL KLEBSIELLA PNEUMONIAE  Final   Report Status 08/30/2015 FINAL  Final   Organism ID, Bacteria KLEBSIELLA PNEUMONIAE  Final      Susceptibility   Klebsiella pneumoniae - MIC*    AMPICILLIN >=32 RESISTANT Resistant     CEFAZOLIN <=4 SENSITIVE Sensitive     CEFTRIAXONE <=1 SENSITIVE Sensitive     CIPROFLOXACIN <=0.25 SENSITIVE Sensitive     GENTAMICIN <=1 SENSITIVE Sensitive     IMIPENEM <=0.25 SENSITIVE Sensitive     NITROFURANTOIN 64 INTERMEDIATE Intermediate     TRIMETH/SULFA <=20 SENSITIVE Sensitive     AMPICILLIN/SULBACTAM 4 SENSITIVE Sensitive     PIP/TAZO 8 SENSITIVE Sensitive     * >=100,000 COLONIES/mL KLEBSIELLA PNEUMONIAE  Blood Culture (routine x 2)     Status: None (Preliminary result)   Collection Time: 08/28/15  1:20 PM  Result Value Ref Range Status   Specimen Description BLOOD RIGHT  ARM  Final   Special Requests BOTTLES DRAWN AEROBIC ONLY 5CC  Final   Culture  Setup Time   Final    GRAM NEGATIVE RODS AEROBIC BOTTLE ONLY CRITICAL RESULT CALLED TO, READ BACK BY AND VERIFIED WITH: K HASTINGS,RN AT 4098 08/29/15 BY L BENFIELD    Culture GRAM NEGATIVE RODS  Final   Report Status PENDING  Incomplete  Blood Culture (routine x 2)     Status: None (Preliminary result)   Collection Time: 08/28/15  1:26 PM  Result Value Ref Range Status   Specimen Description BLOOD LEFT HAND  Final   Special Requests BOTTLES DRAWN AEROBIC ONLY 5CC  Final   Culture  Setup Time   Final    GRAM NEGATIVE RODS AEROBIC BOTTLE ONLY CRITICAL RESULT CALLED TO, READ BACK BY AND VERIFIED  WITH: SABITA @0450  08/29/15 MKELLY    Culture GRAM NEGATIVE RODS  Final   Report Status PENDING  Incomplete  Culture, blood (x 2)     Status: None (Preliminary result)   Collection Time: 08/28/15  5:40 PM  Result Value Ref Range Status   Specimen Description BLOOD RIGHT ANTECUBITAL  Final   Special Requests BOTTLES DRAWN AEROBIC ONLY 6CC  Final   Culture NO GROWTH 2 DAYS  Final   Report Status PENDING  Incomplete  Culture, blood (x 2)     Status: None (Preliminary result)   Collection Time: 08/28/15  5:45 PM  Result Value Ref Range Status   Specimen Description BLOOD LEFT HAND  Final   Special Requests BOTTLES DRAWN AEROBIC ONLY 5CC  Final   Culture NO GROWTH 2 DAYS  Final   Report Status PENDING  Incomplete     Scheduled Meds: . amiodarone  200 mg Oral Daily  . aspirin EC  81 mg Oral Daily  . atorvastatin  80 mg Oral q1800  . ferrous sulfate  325 mg Oral Q breakfast  . furosemide  40 mg Oral Daily  . mesalamine  2.4 g Oral Daily  . multivitamin with minerals  1 tablet Oral Daily  . piperacillin-tazobactam  3.375 g Intravenous 3 times per day  . tamsulosin  0.4 mg Oral QPC breakfast   Continuous Infusions: . heparin 1,350 Units/hr (08/30/15 1439)     Annastasia Haskins, DO  [facility] Pager [phone]  If 7PM-7AM, please contact night-coverage [email or web address] Password [email or web address] 08/30/2015, 2:42 PM   LOS: 2 days

## 2015-08-30 NOTE — Progress Notes (Signed)
ANTICOAGULATION CONSULT NOTE   Pharmacy Consult for heparin Indication: atrial fibrillation  No Known Allergies  Patient Measurements: Height: 6' (182.9 cm) Weight: 199 lb 9.6 oz (90.538 kg) IBW/kg (Calculated) : 77.6 Heparin Dosing Weight: 89 kg  Vital Signs: Temp: 99.6 F (37.6 C) (11/13 2129) Temp Source: Oral (11/13 2129) BP: 102/52 mmHg (11/13 2129) Pulse Rate: 101 (11/13 2129)  Labs:  Recent Labs  08/28/15 1304  08/28/15 1745 08/29/15 0605 08/29/15 1940 08/30/15 0444 08/30/15 1341 08/30/15 2150  HGB 9.8*  --   --  8.4*  --  8.3*  --   --   HCT 31.1*  --   --  27.4*  --  26.7*  --   --   PLT 190  --   --  154  --  142*  --   --   APTT  --   < > 37  --  49* 64* 63* 63*  LABPROT  --   --  17.8*  --   --   --   --   --   INR  --   --  1.45  --   --   --   --   --   HEPARINUNFRC  --   --   --   --  >2.20* 1.68*  --  1.02*  CREATININE 1.24  --   --  1.15  --  1.39*  --   --   < > = values in this interval not displayed.  Estimated Creatinine Clearance: 63.6 mL/min (by C-G formula based on Cr of 1.39).  Assessment: 59 y/o male with h/o Afib, Eliquis on hold, for heparin. Apixaban still interfering with heparin level.    aPTT is just below goal at 63 sec; heparin level is 1.02. No bleeding noted.  Goal of Therapy:  APTT 66-102 while apixaban interfering with anti-Xa level Heparin level 0.3-0.7 units/ml Monitor platelets by anticoagulation protocol: Yes   Plan:  Increase heparin drip to 1700 units/hr Daily heparin level, aPTT, and CBC Monitor for s/sx of bleeding  [facility] Vista Regional Health CenterJennifer Fort Ashby, NicePharm.D., BCPS Clinical Pharmacist Pager: [phone] 08/30/2015 10:38 PM

## 2015-08-31 ENCOUNTER — Inpatient Hospital Stay (HOSPITAL_COMMUNITY): Payer: Non-veteran care

## 2015-08-31 LAB — MAGNESIUM: MAGNESIUM: 1.9 mg/dL (ref 1.7–2.4)

## 2015-08-31 LAB — BASIC METABOLIC PANEL
ANION GAP: 10 (ref 5–15)
BUN: 16 mg/dL (ref 6–20)
CHLORIDE: 107 mmol/L (ref 101–111)
CO2: 22 mmol/L (ref 22–32)
CREATININE: 1.25 mg/dL — AB (ref 0.61–1.24)
Calcium: 8.4 mg/dL — ABNORMAL LOW (ref 8.9–10.3)
GFR calc non Af Amer: >60 mL/min (ref >60)
Glucose, Bld: 105 mg/dL — ABNORMAL HIGH (ref 65–99)
POTASSIUM: 3.7 mmol/L (ref 3.5–5.1)
SODIUM: 139 mmol/L (ref 135–145)

## 2015-08-31 LAB — GRAM STAIN

## 2015-08-31 LAB — CBC
HEMATOCRIT: 28.3 % — AB (ref 39.0–52.0)
HEMOGLOBIN: 8.7 g/dL — AB (ref 13.0–17.0)
MCH: 27.8 pg (ref 26.0–34.0)
MCHC: 30.7 g/dL (ref 30.0–36.0)
MCV: 90.4 fL (ref 78.0–100.0)
Platelets: 140 10*3/uL — ABNORMAL LOW (ref 150–400)
RBC: 3.13 MIL/uL — AB (ref 4.22–5.81)
RDW: 16.6 % — ABNORMAL HIGH (ref 11.5–15.5)
WBC: 8.2 10*3/uL (ref 4.0–10.5)

## 2015-08-31 LAB — GLUCOSE, SEROUS FLUID: Glucose, Fluid: 122 mg/dL

## 2015-08-31 LAB — LACTATE DEHYDROGENASE, PLEURAL OR PERITONEAL FLUID: LD FL: 144 U/L — AB (ref 3–23)

## 2015-08-31 LAB — BODY FLUID CELL COUNT WITH DIFFERENTIAL
Lymphs, Fluid: 58 %
MONOCYTE-MACROPHAGE-SEROUS FLUID: 12 % — AB (ref 50–90)
Neutrophil Count, Fluid: 30 % — ABNORMAL HIGH (ref 0–25)
Total Nucleated Cell Count, Fluid: 1848 cu mm — ABNORMAL HIGH (ref 0–1000)

## 2015-08-31 LAB — PROTEIN, BODY FLUID

## 2015-08-31 LAB — HEPARIN LEVEL (UNFRACTIONATED): HEPARIN UNFRACTIONATED: 0.82 [IU]/mL — AB (ref 0.30–0.70)

## 2015-08-31 LAB — APTT: APTT: 79 s — AB (ref 24–37)

## 2015-08-31 MED ORDER — CIPROFLOXACIN IN D5W 400 MG/200ML IV SOLN
400.0000 mg | Freq: Two times a day (BID) | INTRAVENOUS | Status: DC
  Administered 2015-08-31 – 2015-09-01 (×2): 400 mg via INTRAVENOUS
  Filled 2015-08-31 (×2): qty 200

## 2015-08-31 MED ORDER — LIDOCAINE HCL (PF) 1 % IJ SOLN
INTRAMUSCULAR | Status: AC
  Filled 2015-08-31: qty 10

## 2015-08-31 MED ORDER — APIXABAN 5 MG PO TABS: 5.0000 mg | ORAL_TABLET | Freq: Two times a day (BID) | ORAL | Status: DC

## 2015-08-31 MED ORDER — APIXABAN 5 MG PO TABS
5.0000 mg | ORAL_TABLET | Freq: Two times a day (BID) | ORAL | Status: DC
  Administered 2015-08-31 – 2015-09-01 (×2): 5 mg via ORAL
  Filled 2015-08-31 (×2): qty 1

## 2015-08-31 NOTE — Progress Notes (Signed)
PROGRESS NOTE  Ryan Cole:[id number] DOB: 04/02/1956 DOA: 08/28/2015 PCP: Oliver Barre, MD  Brief history 59 year old male with a history of hyperlipidemia, hypertension, atrial flutter s/p DCCV, aortic dissection repair (08/03/15), and ulcerative colitis presents with urinary retention. The patient was recently discharged from the hospital on 08/16/2015 after aortic dissection repair by Dr. Dorris Fetch. The patient was discharged with a Foley catheter in place secondary to urinary retention. He had his Foley catheter removed at the [facility] in [place], [place]. Initially, the patient was able to urinate, but after he got home, he had difficulty urinating. The patient also complained of subjective chills. Upon presentation to emergency department, a foley catheter was placed draining 1 L of urine. The patient was also noted to have tachycardia, fever, leukocytosis. The patient was admitted for possible sepsis from a urinary source. He was initially started on ceftriaxone which was changed to Zosyn pending culture data.  Pt then changed to ciprofloxacin when cultures returned  Assessment/Plan: Sepsis -Secondary to bacteremia with urinary source -Discontinue ceftriaxone -disontinue Zosyn 11/14 -start IV cipro in preparation to go home with po cipro pending final susceptibilities Bacteremia--GNR -Secondary to urinary source -Discontinue Zosyn 11/14 -start IV cipro in preparation to go home with po cipro pending final susceptibilities Complicated UTI--Klebsiella -Suspect CAUTI as the patient was discharged home with a Foley catheter -Discontinue Zosyn 11/14 -start IV cipro in preparation to go home with po cipro pending final susceptibilities Urinary retention -Foley catheter placed in the emergency department -Continue Flomax Postoperative atrial flutter -presently in sinus s/p DCCV on 08/14/15 -Continue amiodarone -Hold metoprolol as the patient's blood pressure  remainsSoft -CHADS-VASc = 4 -11/14--restart apixaban; d/c IV heparin S/p Aortic dissection repair -reconsulted TCTS -appreciate Dr. Dorris Fetch Chronic systolic CHF -08/16/2015 discharge weight 196 pounds -Check daily weights -Start oral furosemide as he appears to be slightly on the hypervolemic side -08/11/2015 heart catheterization--no significant CAD, EF 35-45 percent  -08/14/2015 TEE-EF 45-50 percent  Left Pleural Effusion--Exudative -no hypoxemia or increased WOB -appreciate Dr. Dorris Fetch -thoracocentesis on 11/14--only 180cc removed -follow cultures of fluid AKI -renal us--no hydronephrosis -likely sepsis and diuresis Hyperlipidemia  -Continue statin  Hypokalemia -Replete -Check magnesium--1.9  Family Communication: Pt at beside Disposition Plan: Home 1-2 days     Procedures/Studies: Dg Chest 1 View  08/31/2015  CLINICAL DATA:  Status post left-sided thoracentesis EXAM: CHEST  1 VIEW COMPARISON:  08/28/2015 FINDINGS: Cardiac shadow remains enlarged. Postsurgical changes are again seen. There is been reduction in the left-sided pleural effusion following thoracentesis. Small persistent right pleural effusion is noted. Left basilar atelectasis is seen. IMPRESSION: Improvement in left pleural effusion.  No pneumothorax is noted. Electronically Signed   By: Alcide Clever M.D.   On: 08/31/2015 11:52   Dg Chest 2 View  08/09/2015  CLINICAL DATA:  Post thoracic aortic aneurysm repair 08/02/2015 EXAM: CHEST  2 VIEW COMPARISON:  08/07/2015 FINDINGS: Enlargement of cardiac silhouette post median sternotomy. Epicardial pacing wires noted. Stable mediastinal contours and pulmonary vascularity. Bibasilar effusions and atelectasis greater on LEFT little changed. No acute infiltrate or pneumothorax. Bones unremarkable. IMPRESSION: Persistent bibasilar at atelectasis and pleural effusions greater on LEFT, unchanged. Enlargement of cardiac silhouette. Electronically Signed    By: Ulyses Southward M.D.   On: 08/09/2015 09:12   Ct Angio Chest Pe W/cm &/or Wo Cm  08/02/2015  CLINICAL DATA:  59 yr old male with chest pain radiating to left side of jaw. Diaphoretic. Had about  a week ago but didn't seek medical attention. No known heart problems. EXAM: CT ANGIOGRAPHY CHEST WITH CONTRAST TECHNIQUE: Multidetector CT imaging of the chest was performed using the standard protocol during bolus administration of intravenous contrast. Multiplanar CT image reconstructions and MIPs were obtained to evaluate the vascular anatomy. CONTRAST:  OMNIPAQUE IOHEXOL 350 MG/ML SOLN COMPARISON:  Current chest radiographs. FINDINGS: Angiographic study: No evidence of a pulmonary embolus. Aorta is not opacified. It is dilated. Ascending aorta measures 4.7 cm in diameter approximately 4 cm above the aortic valve and 5.2 cm at the aortic root. Aortic branch vessels are normal in caliber. Thoracic inlet:  No mass or adenopathy.  Thyroid unremarkable. Mediastinum and hila: Heart mildly enlarged. No mediastinal or hilar masses or pathologically enlarged lymph nodes. Lungs and pleura: Mild dependent lower lobe subsegmental atelectasis. No evidence of pneumonia or edema. No mass or suspicious nodule. No pleural effusion or pneumothorax. Limited upper abdomen: Probable small cyst from the upper pole left kidney. Otherwise unremarkable. Musculoskeletal: Mild degenerative spurring noted along the thoracic spine. No osteoblastic or osteolytic lesions. Review of the MIP images confirms the above findings. IMPRESSION: 1. No evidence of a pulmonary embolus. 2. 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 3. No acute findings. Lungs are clear other than mild dependent subsegmental atelectasis. Electronically Signed   By: Amie Portland M.D.   On: 08/02/2015 20:42   US Renal  08/30/2015  CLINICAL DATA:  Acute renal injury EXAM: RENAL / URINARY TRACT ULTRASOUND COMPLETE  COMPARISON:  None. FINDINGS: Right Kidney: Length: 12 cm. A 1.4 cm cyst is noted in the upper pole of the right kidney. No mass lesion or hydronephrosis is noted. Left Kidney: Length: 12.1 cm. Echogenicity within normal limits. No mass or hydronephrosis visualized. Bladder: Decompressed by Foley catheter. Note is made of bilateral pleural effusions. IMPRESSION: Bilateral pleural effusions. Right renal cyst. Electronically Signed   By: Alcide Clever M.D.   On: 08/30/2015 17:11   Dg Chest Port 1 View  08/28/2015  CLINICAL DATA:  Fever.  Tachycardia. EXAM: PORTABLE CHEST 1 VIEW COMPARISON:  08/09/2015 and multiple previous FINDINGS: Previous median sternotomy and CABG. Chronic cardiomegaly and unfolded aorta. Enlarging effusion on the left with worsened volume loss in the left lower lobe. Small effusion on the right appears similar to the previous study. IMPRESSION: Enlarging effusion on the left with worsened volume loss in the left lower lobe. There could certainly be associated pneumonia. Small effusion on the right. Electronically Signed   By: Paulina Fusi M.D.   On: 08/28/2015 17:53   Dg Chest Port 1 View  08/07/2015  CLINICAL DATA:  Aortic dissection . EXAM: PORTABLE CHEST 1 VIEW COMPARISON:  08/06/2015. FINDINGS: Interim removal right IJ sheath. Prior CABG. Cardiomegaly. Left lower lobe atelectasis and/or infiltrate. Small left pleural effusion unchanged. No pneumothorax. IMPRESSION: 1. Interval removal right IJ sheath. 2. Prior median sternotomy. Prior thoracic aneurysm repair. Mediastinum is stable. 3. Persistent left lower lobe atelectasis and/or infiltrate. Small left pleural effusion. No interim change . Electronically Signed   By: Maisie Fus  Register   On: 08/07/2015 07:39   Dg Chest Port 1 View  08/06/2015  CLINICAL DATA:  Aortic dissection. EXAM: PORTABLE CHEST 1 VIEW COMPARISON:  08/05/2015. FINDINGS: Right IJ sheath in stable position. Interval removal of mediastinal drainage catheters. New  prior median sternotomy and thoracic aneurysm repair. Mediastinum is stable. Stable cardiomegaly. Persistent left lower lobe atelectasis and/or infiltrate. Improving right  base atelectasis. Small left pleural effusion. No pneumothorax. IMPRESSION: 1. Interval removal of mediastinal drainage catheters. Right IJ sheath in stable position. No pneumothorax. 2. Prior median sternotomy and thoracic aneurysm repair. Mediastinum is stable. Stable cardiomegaly. 3. Persistent left lower lobe atelectasis and/or infiltrate. Interim clearing of right base atelectasis. Small left pleural effusion . Electronically Signed   By: Maisie Fus  Register   On: 08/06/2015 07:54   Dg Chest Port 1 View  08/05/2015  CLINICAL DATA:  Status post repair of type 1 aortic dissection. EXAM: PORTABLE CHEST 1 VIEW COMPARISON:  08/04/2015 FINDINGS: Endotracheal tube, enteric tube, and Swan-Ganz catheter have been removed. Right jugular sheath remains in place with tip overlying the upper SVC. Mediastinal drains remain in place. Sternotomy wires are again noted. Cardiac silhouette is mildly enlarged. Lung volumes are slightly diminished compared to the prior study. Bibasilar lung opacities and pleural effusions have not significantly changed. No pneumothorax is identified. IMPRESSION: Interval extubation. Persistent bibasilar atelectasis and small pleural effusions. Electronically Signed   By: Sebastian Ache M.D.   On: 08/05/2015 07:44   Dg Chest Port 1 View  08/04/2015  CLINICAL DATA:  Thoracic ascending aortic aneurysm repair EXAM: PORTABLE CHEST 1 VIEW COMPARISON:  08/03/2015 FINDINGS: Endotracheal tube has been withdrawn and is now in good position. Swan-Ganz catheter in the main pulmonary artery. Pericardial and mediastinal drains in good position. NG tube in place. Negative for pneumothorax Increase in bibasilar atelectasis and small pleural effusions. Negative for edema. IMPRESSION: Endotracheal tube and support lines in good position  Progressive bibasilar atelectasis and small pleural effusions. Electronically Signed   By: Marlan Palau M.D.   On: 08/04/2015 08:10   Dg Chest Port 1 View  08/03/2015  CLINICAL DATA:  59 year old male with a history of type 1 aortic dissection repair EXAM: PORTABLE CHEST 1 VIEW COMPARISON:  Plain film 08/03/2015, CT 08/02/2015 FINDINGS: Early postoperative changes of type 1 aortic dissection repair. Slight right rotation the patient. There is slightly increased diameter of the upper mediastinum on the current plain film compared to the prior CT, which may be secondary to patient positioning. Unchanged position of endotracheal tube, which terminates just above the carina and is directed towards the right mainstem. At least 3 mediastinal drains/pleural drains project over the mediastinum. Gastric tube terminates within the left upper quadrant, with the side port at the gastroesophageal junction. Unchanged position of the right IJ approach sheath, through which a Swan-Ganz catheter terminates in the region of the pulmonary outflow tract. Surgical changes of median sternotomy. Lung volumes are low with ill-defined opacities at the left greater than right base. IMPRESSION: Early postoperative changes of median sternotomy and a type 1 dissection repair. The superior mediastinum is slightly widened compared to the recent plain film comparison, though this may be secondary to slight right rotation. If there is concern for acute abnormality, correlation with either cardiac echo or contrast-enhanced CT may be useful. Endotracheal tube terminates just above the carina directed towards the right mainstem. Withdrawal of 3 cm to 7 cm may be useful for better position. Unchanged position of multiple mediastinal/ pleural drains, right IJ sheath through which a Swan-Ganz catheter is transmitted, and gastric tube. Atelectasis at the left base. These results were called by telephone at the time of interpretation on 08/03/2015 at  3:42 pm to the nurse caring for the patient, Ms Louie Bun, who verbally acknowledged these results. Electronically Signed   By: Gilmer Mor D.O.   On: 08/03/2015 15:42   Dg Chest  Port 1 View  08/03/2015  CLINICAL DATA:  Postop CABG evaluate for suction instrument 4" long x 0.75" wide EXAM: PORTABLE CHEST 1 VIEW COMPARISON:  08/02/2015 FINDINGS: Swan-Ganz central venous catheter with tip in the right main pulmonary artery. Endotracheal tube with tip about 5 mm above the carina. There are two left chest tubes and a mediastinal drain. There is a nasogastric tube extending off the inferior edge of the film with the side hole over the distal esophagus. There is postoperative hazy attenuation at both lung bases likely representing atelectasis. No evidence postoperative edema or pneumothorax. There is no evidence of the described suction device. IMPRESSION: Two left chest tubes, mediastinal drain, NG tube, ET tube, and swan ganz line. Electronically Signed   By: Esperanza Heiraymond  Rubner M.D.   On: 08/03/2015 10:07   Dg Chest Port 1 View  08/02/2015  CLINICAL DATA:  Chest pain starting today EXAM: PORTABLE CHEST 1 VIEW COMPARISON:  08/25/2007 FINDINGS: The heart size and mediastinal contours are within normal limits. Both lungs are clear. The visualized skeletal structures are unremarkable. IMPRESSION: No active disease. Electronically Signed   By: Natasha MeadLiviu  Pop M.D.   On: 08/02/2015 17:36         Subjective: Patient denies fevers, chills, headache, chest pain, dyspnea, nausea, vomiting, diarrhea, abdominal pain, dysuria, hematuria; denies any coughing, hemoptysis, hematochezia, melena    Objective: Filed Vitals:   08/31/15 0519 08/31/15 0900 08/31/15 1056 08/31/15 1439  BP:  107/53 105/56 119/73  Pulse:    92  Temp:      TempSrc:      Resp:    25  Height:      Weight: 89.812 kg (198 lb)     SpO2:    100%    Intake/Output Summary (Last 24 hours) at 08/31/15 1504 Last data filed at 08/31/15 0547   Gross per 24 hour  Intake 821.78 ml  Output   1200 ml  Net -378.22 ml   Weight change: -0.726 kg (-1 lb 9.6 oz) Exam:   General:  Pt is alert, follows commands appropriately, not in acute distress  HEENT: No icterus, No thrush, No neck mass, [place]/AT  Cardiovascular: RRR, S1/S2, no rubs, no gallops  Respiratory: Diminished breath sounds left base. Right basilar crackles. No wheeze  Abdomen: Soft/+BS, non tender, non distended, no guarding; no hepatosplenomegaly  Extremities: 1+ LE edema, No lymphangitis, No petechiae, No rashes, no synovitis; no cyanosis or clubbing  Data Reviewed: Basic Metabolic Panel:  Recent Labs Lab 08/28/15 1304 08/29/15 0605 08/30/15 0444 08/31/15 0540  NA 139 139 137 139  K 4.1 4.1 3.3* 3.7  CL 105 107 106 107  CO2 24 24 23 22   GLUCOSE 86 101* 130* 105*  BUN 17 17 15 16   CREATININE 1.24 1.15 1.39* 1.25*  CALCIUM 9.3 8.3* 8.1* 8.4*  MG  --   --   --  1.9   Liver Function Tests:  Recent Labs Lab 08/28/15 1304  AST 20  ALT 16*  ALKPHOS 100  BILITOT 0.7  PROT 6.1*  ALBUMIN 3.5   No results for input(s): LIPASE, AMYLASE in the last 168 hours. No results for input(s): AMMONIA in the last 168 hours. CBC:  Recent Labs Lab 08/28/15 1304 08/29/15 0605 08/30/15 0444 08/31/15 0540  WBC 14.2* 16.0* 11.4* 8.2  NEUTROABS 12.7*  --   --   --   HGB 9.8* 8.4* 8.3* 8.7*  HCT 31.1* 27.4* 26.7* 28.3*  MCV 88.6 89.3 88.4 90.4  PLT  190 154 142* 140*   Cardiac Enzymes: No results for input(s): CKTOTAL, CKMB, CKMBINDEX, TROPONINI in the last 168 hours. BNP: Invalid input(s): POCBNP CBG: No results for input(s): GLUCAP in the last 168 hours.  Recent Results (from the past 240 hour(s))  Urine culture     Status: None   Collection Time: 08/28/15 11:37 AM  Result Value Ref Range Status   Specimen Description URINE, RANDOM  Final   Special Requests NONE  Final   Culture >=100,000 COLONIES/mL KLEBSIELLA PNEUMONIAE  Final   Report Status  08/30/2015 FINAL  Final   Organism ID, Bacteria KLEBSIELLA PNEUMONIAE  Final      Susceptibility   Klebsiella pneumoniae - MIC*    AMPICILLIN >=32 RESISTANT Resistant     CEFAZOLIN <=4 SENSITIVE Sensitive     CEFTRIAXONE <=1 SENSITIVE Sensitive     CIPROFLOXACIN <=0.25 SENSITIVE Sensitive     GENTAMICIN <=1 SENSITIVE Sensitive     IMIPENEM <=0.25 SENSITIVE Sensitive     NITROFURANTOIN 64 INTERMEDIATE Intermediate     TRIMETH/SULFA <=20 SENSITIVE Sensitive     AMPICILLIN/SULBACTAM 4 SENSITIVE Sensitive     PIP/TAZO 8 SENSITIVE Sensitive     * >=100,000 COLONIES/mL KLEBSIELLA PNEUMONIAE  Blood Culture (routine x 2)     Status: None (Preliminary result)   Collection Time: 08/28/15  1:20 PM  Result Value Ref Range Status   Specimen Description BLOOD RIGHT ARM  Final   Special Requests BOTTLES DRAWN AEROBIC ONLY 5CC  Final   Culture  Setup Time   Final    GRAM NEGATIVE RODS AEROBIC BOTTLE ONLY CRITICAL RESULT CALLED TO, READ BACK BY AND VERIFIED WITH: K HASTINGS,RN AT 0981 08/29/15 BY L BENFIELD    Culture GRAM NEGATIVE RODS  Final   Report Status PENDING  Incomplete  Blood Culture (routine x 2)     Status: None (Preliminary result)   Collection Time: 08/28/15  1:26 PM  Result Value Ref Range Status   Specimen Description BLOOD LEFT HAND  Final   Special Requests BOTTLES DRAWN AEROBIC ONLY 5CC  Final   Culture  Setup Time   Final    GRAM NEGATIVE RODS AEROBIC BOTTLE ONLY CRITICAL RESULT CALLED TO, READ BACK BY AND VERIFIED WITH: SABITA @0450  08/29/15 MKELLY    Culture KLEBSIELLA PNEUMONIAE  Final   Report Status PENDING  Incomplete  Culture, blood (x 2)     Status: None (Preliminary result)   Collection Time: 08/28/15  5:40 PM  Result Value Ref Range Status   Specimen Description BLOOD RIGHT ANTECUBITAL  Final   Special Requests BOTTLES DRAWN AEROBIC ONLY 6CC  Final   Culture NO GROWTH 3 DAYS  Final   Report Status PENDING  Incomplete  Culture, blood (x 2)     Status: None  (Preliminary result)   Collection Time: 08/28/15  5:45 PM  Result Value Ref Range Status   Specimen Description BLOOD LEFT HAND  Final   Special Requests BOTTLES DRAWN AEROBIC ONLY 5CC  Final   Culture NO GROWTH 3 DAYS  Final   Report Status PENDING  Incomplete  Gram stain     Status: None   Collection Time: 08/31/15 10:37 AM  Result Value Ref Range Status   Specimen Description FLUID PLEURAL LEFT  Final   Special Requests NONE  Final   Gram Stain   Final    MODERATE WBC PRESENT,BOTH PMN AND MONONUCLEAR NO ORGANISMS SEEN    Report Status 08/31/2015 FINAL  Final  Scheduled Meds: . amiodarone  200 mg Oral Daily  . aspirin EC  81 mg Oral Daily  . atorvastatin  80 mg Oral q1800  . ferrous sulfate  325 mg Oral Q breakfast  . furosemide  40 mg Oral Daily  . lidocaine (PF)      . mesalamine  2.4 g Oral Daily  . multivitamin with minerals  1 tablet Oral Daily  . piperacillin-tazobactam  3.375 g Intravenous 3 times per day  . potassium chloride  40 mEq Oral Daily  . tamsulosin  0.4 mg Oral QPC breakfast   Continuous Infusions: . heparin 1,700 Units/hr (08/31/15 0517)     Tyjae Issa, DO  [facility] Pager [phone]  If 7PM-7AM, please contact night-coverage [email or web address] Password [email or web address] 08/31/2015, 3:04 PM   LOS: 3 days

## 2015-08-31 NOTE — Progress Notes (Signed)
      [address]       [place] [id number]             [phone]      S/p thoracentesis  Relatively small amount of fluid drained  Post drainage CXR more clearly shows elevation of the left hemidiaphragm. This is a common complication and resolves with time in ~90% of people. It usually takes 6-9 months to improve.  Salvatore DecentSteven C. Dorris FetchHendrickson, MD [facility] [phone]

## 2015-08-31 NOTE — Care Management Note (Signed)
Case Management Note  Patient Details  Name: Alvester MorinDaryl V Haney MRN: [id number] Date of Birth: 07/07/1956  Subjective/Objective:                  Date-08-31-15 Initial Assessment Spoke with patient at the bedside Introduced self as case manager and explained role in discharge planning and how to be reached.  [place]. Verified patient anticipates to go home with spouse, at time of discharge and will have full-time supervision by family friends neighbors at this time to best of their knowledge.  Patient has DME cane. Expressed potential need for no other DME.  Patient denied  needing help with their medication. [facility], but also uses [facility] on [address] for short term meds.  Patient drives, or is driven wife to MD appointments.  Verified patient [facility]. Patient states they currently receive HH services through no one.   Plan: CM will continue to follow for discharge planning and [facility] resources.   Lawerance Sabalebbie Teron Blais RN BSN CM [phone]   Action/Plan:   Expected Discharge Date:                  Expected Discharge Plan:  Home/Self Care  In-House Referral:     Discharge planning Services  CM Consult  Post Acute Care Choice:    Choice offered to:     DME Arranged:    DME Agency:     HH Arranged:    HH Agency:     Status of Service:  In process, will continue to follow  Medicare Important Message Given:    Date Medicare IM Given:    Medicare IM give by:    Date Additional Medicare IM Given:    Additional Medicare Important Message give by:     If discussed at Long Length of Stay Meetings, dates discussed:    Additional Comments:  Lawerance SabalDebbie Lilit Cinelli, RN 08/31/2015, 2:33 PM

## 2015-08-31 NOTE — Progress Notes (Signed)
ANTICOAGULATION CONSULT NOTE   Pharmacy Consult for heparin Indication: atrial fibrillation  No Known Allergies  Patient Measurements: Height: 6' (182.9 cm) Weight: 198 lb (89.812 kg) IBW/kg (Calculated) : 77.6 Heparin Dosing Weight: 89 kg  Vital Signs: Temp: 98.7 F (37.1 C) (11/14 0514) Temp Source: Oral (11/14 0514) BP: 116/62 mmHg (11/14 0514) Pulse Rate: 90 (11/14 0514)  Labs:  Recent Labs  08/28/15 1745 08/29/15 0605  08/30/15 0444 08/30/15 1341 08/30/15 2150 08/31/15 0540  HGB  --  8.4*  --  8.3*  --   --  8.7*  HCT  --  27.4*  --  26.7*  --   --  28.3*  PLT  --  154  --  142*  --   --  140*  APTT 37  --   < > 64* 63* 63* 79*  LABPROT 17.8*  --   --   --   --   --   --   INR 1.45  --   --   --   --   --   --   HEPARINUNFRC  --   --   < > 1.68*  --  1.02* 0.82*  CREATININE  --  1.15  --  1.39*  --   --  1.25*  < > = values in this interval not displayed.  Estimated Creatinine Clearance: 70.7 mL/min (by C-G formula based on Cr of 1.25).  Assessment: 59 y/o male with h/o Afib on eliquis PTA, now on IV heparin while holding Eliquis pending thoracentesis. Apixaban still interfering with heparin level.    aPTT is 79 at goal; heparin level is 0.82. No bleeding noted.  Goal of Therapy:  APTT 66-102 while apixaban interfering with anti-Xa level Heparin level 0.3-0.7 units/ml Monitor platelets by anticoagulation protocol: Yes   Plan:  Continue heparin 1700 units/hr Daily heparin level, aPTT, and CBC Monitor for s/sx of bleeding F/u plan for restarting eliquis after thoracentesis  Bayard HuggerMei Kerie Badger, PharmD, BCPS  Clinical Pharmacist  Pager: [phone]   08/31/2015 8:50 AM

## 2015-08-31 NOTE — Progress Notes (Signed)
   08/31/15 1304  Clinical Encounter Type  Visited With Patient and family together  Visit Type Initial  Referral From Nurse   Chaplain responded to a request to do an advanced directive. However, patient denied that he requested that and he's not interested in completing the forms. Chaplain offered support and prayer, and introduced spiritual care services. Chaplain support available as needed.   Alda PonderAdam M Yasmina [place], Chaplain 08/31/2015 1:06 PM

## 2015-08-31 NOTE — Procedures (Signed)
   US guided Left thoracentesis  180 cc bloody fluid obtained Sent for labs per MD Pt tolerated well  cxr pending

## 2015-09-01 LAB — CBC
HCT: 25.8 % — ABNORMAL LOW (ref 39.0–52.0)
HEMOGLOBIN: 8.2 g/dL — AB (ref 13.0–17.0)
MCH: 28.2 pg (ref 26.0–34.0)
MCHC: 31.8 g/dL (ref 30.0–36.0)
MCV: 88.7 fL (ref 78.0–100.0)
PLATELETS: 145 10*3/uL — AB (ref 150–400)
RBC: 2.91 MIL/uL — AB (ref 4.22–5.81)
RDW: 16.2 % — ABNORMAL HIGH (ref 11.5–15.5)
WBC: 6.6 10*3/uL (ref 4.0–10.5)

## 2015-09-01 LAB — BASIC METABOLIC PANEL
Anion gap: 7 (ref 5–15)
BUN: 14 mg/dL (ref 6–20)
CHLORIDE: 107 mmol/L (ref 101–111)
CO2: 25 mmol/L (ref 22–32)
CREATININE: 1.05 mg/dL (ref 0.61–1.24)
Calcium: 8.8 mg/dL — ABNORMAL LOW (ref 8.9–10.3)
GFR calc Af Amer: >60 mL/min (ref >60)
GFR calc non Af Amer: >60 mL/min (ref >60)
GLUCOSE: 97 mg/dL (ref 65–99)
Potassium: 3.7 mmol/L (ref 3.5–5.1)
SODIUM: 139 mmol/L (ref 135–145)

## 2015-09-01 LAB — CULTURE, BLOOD (ROUTINE X 2)

## 2015-09-01 LAB — MAGNESIUM: MAGNESIUM: 2 mg/dL (ref 1.7–2.4)

## 2015-09-01 MED ORDER — CIPROFLOXACIN HCL 500 MG PO TABS: 500.0000 mg | ORAL_TABLET | Freq: Two times a day (BID) | ORAL | Status: DC

## 2015-09-01 MED ORDER — POTASSIUM CHLORIDE CRYS ER 20 MEQ PO TBCR: 20.0000 meq | EXTENDED_RELEASE_TABLET | Freq: Every day | ORAL | Status: DC

## 2015-09-01 MED ORDER — AMIODARONE HCL 200 MG PO TABS: 200.0000 mg | ORAL_TABLET | Freq: Every day | ORAL | Status: DC

## 2015-09-01 MED ORDER — FUROSEMIDE 40 MG PO TABS: 40.0000 mg | ORAL_TABLET | Freq: Every day | ORAL | Status: DC

## 2015-09-01 NOTE — Discharge Instructions (Signed)
Information on my medicine - ELIQUIS® (apixaban) ° °This medication education was reviewed with me or my healthcare representative as part of my discharge preparation.  ° °Why was Eliquis® prescribed for you? °Eliquis® was prescribed for you to reduce the risk of a blood clot forming that can cause a stroke if you have a medical condition called atrial fibrillation (a type of irregular heartbeat). ° °What do You need to know about Eliquis® ? °Take your Eliquis® 5mg TWICE DAILY - one tablet in the morning and one tablet in the evening with or without food. If you have difficulty swallowing the tablet whole please discuss with your pharmacist how to take the medication safely. ° °Take Eliquis® exactly as prescribed by your doctor and DO NOT stop taking Eliquis® without talking to the doctor who prescribed the medication.  Stopping may increase your risk of developing a stroke.  Refill your prescription before you run out. ° °After discharge, you should have regular check-up appointments with your healthcare provider that is prescribing your Eliquis®.  In the future your dose may need to be changed if your kidney function or weight changes by a significant amount or as you get older. ° °What do you do if you miss a dose? °If you miss a dose, take it as soon as you remember on the same day and resume taking twice daily.  Do not take more than one dose of ELIQUIS at the same time to make up a missed dose. ° °Important Safety Information °A possible side effect of Eliquis® is bleeding. You should call your healthcare provider right away if you experience any of the following: °? Bleeding from an injury or your nose that does not stop. °? Unusual colored urine (red or dark brown) or unusual colored stools (red or black). °? Unusual bruising for unknown reasons. °? A serious fall or if you hit your head (even if there is no bleeding). ° °Some medicines may interact with Eliquis® and might increase your risk of bleeding or  clotting while on Eliquis®. To help avoid this, consult your healthcare provider or pharmacist prior to using any new prescription or non-prescription medications, including herbals, vitamins, non-steroidal anti-inflammatory drugs (NSAIDs) and supplements. ° °This website has more information on Eliquis® (apixaban): http://www.eliquis.com/eliquis/home ° °

## 2015-09-01 NOTE — Progress Notes (Signed)
      [address]       [place] [id number]             [phone]      Feels well this AM. Hoping to go home soon  BP 112/65 mmHg  Pulse 81  Temp(Src) 98.1 F (36.7 C) (Oral)  Resp 20  Ht 6' (1.829 m)  Wt 198 lb 3.1 oz (89.9 kg)  BMI 26.87 kg/m2  SpO2 98%   Intake/Output Summary (Last 24 hours) at 09/01/15 0917 Last data filed at 09/01/15 [id number]  Gross per 24 hour  Intake    320 ml  Output   1600 ml  Net  -1280 ml   CBC    Component Value Date/Time   WBC 6.6 09/01/2015 0530   RBC 2.91* 09/01/2015 0530   HGB 8.2* 09/01/2015 0530   HCT 25.8* 09/01/2015 0530   PLT 145* 09/01/2015 0530   MCV 88.7 09/01/2015 0530   MCH 28.2 09/01/2015 0530   MCHC 31.8 09/01/2015 0530   RDW 16.2* 09/01/2015 0530   LYMPHSABS 0.6* 08/28/2015 1304   MONOABS 1.0 08/28/2015 1304   EOSABS 0.0 08/28/2015 1304   BASOSABS 0.0 08/28/2015 1304   Decreased BS left base Wound clean and dry  Continues to improve with treatment of UTI I will plan to see him back in a couple of weeks after dc  Ryan John C. Dorris FetchHendrickson, MD [facility] [phone]

## 2015-09-01 NOTE — Progress Notes (Signed)
Pt given all of discharge instructions and prescription. All questions answered and and pt verbalized understanding. Belongings with pt.

## 2015-09-01 NOTE — Discharge Summary (Signed)
Physician Discharge Summary  Ryan Cole:[id number] DOB: [birth year] DOA: 08/28/2015  PCP: Oliver Barre, MD  Admit date: 08/28/2015 Discharge date: 09/01/2015  Recommendations for Outpatient Follow-up:  1. Pt will need to follow up with PCP in 2 weeks post discharge 2. Please obtain BMP 09/08/15  Discharge Diagnoses:  Sepsis -Secondary to bacteremia with urinary source -Discontinue ceftriaxone -disontinue Zosyn 11/14 -start IV cipro in preparation to go home with po cipro pending final susceptibilities Bacteremia--Klebsiella -Secondary to urinary source -Discontinue Zosyn 11/14 -start IV cipro in preparation to go home with po cipro pending final susceptibilities -home with po cipro x 11 more days Complicated UTI--Klebsiella -Suspect CAUTI as the patient was discharged home with a Foley catheter -Discontinue Zosyn 11/14 -start IV cipro in preparation to go home with po cipro pending final susceptibilities -home with po cipro x 11 more days Urinary retention -may have been due to patient's opioids -Foley catheter placed in the emergency department -Continue Flomax -09/01/15--foley catheter removed, and pt able to urinate spontaneously -[facility] able to see pt outpt if patient so desires Postoperative atrial flutter -presently in sinus s/p DCCV on 08/14/15 -Continue amiodarone -Hold metoprolol as the patient's blood pressure remainsSoft -CHADS-VASc = 4 -11/14--restart apixaban; d/c IV heparin S/p Aortic dissection repair -reconsulted TCTS -appreciate Dr. Dorris Fetch Chronic systolic CHF -08/16/2015 discharge weight 196 pounds -Check daily weights--198 on day of d/c -Start oral furosemide as he appears to be slightly on the hypervolemic side -home with lasix 40 mg po daily -08/11/2015 heart catheterization--no significant CAD, EF 35-45 percent  -08/14/2015 TEE-EF 45-50 percent  Left Pleural Effusion--Exudative -no hypoxemia or increased WOB -appreciate  Dr. Dorris Fetch -likely postoperative inflammatory fluid  -thoracocentesis on 11/14--only 180cc removed -cytology negative -follow cultures of fluid--no growth to date AKI -renal us--no hydronephrosis -likely sepsis and diuresis -resolved -serum creatinine 1.05 on day of d/c Hyperlipidemia  -Continue statin  Hypokalemia -Repleted -Check magnesium--1.9  Discharge Condition: stable  Disposition: home Follow-up Information    Follow up with [name],[name], MD. Schedule an appointment as soon as possible for a visit in 2 days.   Specialty:  Urology   Contact information:   [address] [place] [address] [phone]       Diet:heart healthy Wt Readings from Last 3 Encounters:  09/01/15 89.9 kg (198 lb 3.1 oz)  08/16/15 89.177 kg (196 lb 9.6 oz)  05/30/11 106.142 kg (234 lb)    History of present illness:  59 year old male with a history of hyperlipidemia, hypertension, atrial flutter s/p DCCV, aortic dissection repair (08/03/15), and ulcerative colitis presents with urinary retention. The patient was recently discharged from the hospital on 08/16/2015 after aortic dissection repair by Dr. Dorris Fetch. The patient was discharged with a Foley catheter in place secondary to urinary retention. He had his Foley catheter removed at the [facility] in [place], [place]. Initially, the patient was able to urinate, but after he got home, he had difficulty urinating. The patient also complained of subjective chills. Upon presentation to emergency department, a foley catheter was placed draining 1 L of urine. The patient was also noted to have tachycardia, fever, leukocytosis. The patient was admitted for possible sepsis from a urinary source.  Urine cultures and blood cultures grew Klebsiella pneumoniae.  He was initially started on ceftriaxone which was changed to Zosyn pending culture data. Pt then changed to ciprofloxacin when cultures returned. He will go home with oral ciprofloxacin for  additional days. Thoracic surgery was consulted. Dr. Dorris Fetch saw the patient. He  recommended thoracocentesis. This was performed on 08/31/2015. Pleural fluid Cultures were negative at time of discharge.  Consultants: Dr. Dorris Fetch  Discharge Exam: Filed Vitals:   09/01/15 1341  BP: 122/71  Pulse: 84  Temp: 98.6 F (37 C)  Resp: 16   Filed Vitals:   08/31/15 1439 08/31/15 2216 09/01/15 0553 09/01/15 1341  BP: 119/73 117/64 112/65 122/71  Pulse: 92 91 81 84  Temp:  98.6 F (37 C) 98.1 F (36.7 C) 98.6 F (37 C)  TempSrc:  Oral Oral Oral  Resp: Height:      Weight:   89.9 kg (198 lb 3.1 oz)   SpO2: 100% 100% 98% 100%   General: A&O x 3, NAD, pleasant, cooperative Cardiovascular: RRR, no rub, no gallop, no S3 Respiratory: diminished breath sense of the bases. No wheezing. Good air movement. Abdomen:soft, nontender, nondistended, positive bowel sounds Extremities: 1+ LE edema, No lymphangitis, no petechiae  Discharge Instructions      Discharge Instructions    Diet - low sodium heart healthy    Complete by:  As directed      Increase activity slowly    Complete by:  As directed             Medication List    TAKE these medications        acetaminophen 500 MG tablet  Commonly known as:  TYLENOL  Take 2 tablets (1,000 mg total) by mouth every 6 (six) hours as needed for mild pain.     amiodarone 200 MG tablet  Commonly known as:  PACERONE  Take 1 tablet (200 mg total) by mouth daily.     apixaban 5 MG Tabs tablet  Commonly known as:  ELIQUIS  Take 1 tablet (5 mg total) by mouth 2 (two) times daily.     aspirin 81 MG EC tablet  Take 1 tablet (81 mg total) by mouth daily.     atorvastatin 80 MG tablet  Commonly known as:  LIPITOR  Take 1 tablet (80 mg total) by mouth daily at 6 PM.     ciprofloxacin 500 MG tablet  Commonly known as:  CIPRO  Take 1 tablet (500 mg total) by mouth 2 (two) times daily.     ferrous sulfate 325 (65 FE) MG EC  tablet  Take 325 mg by mouth daily with breakfast.     furosemide 40 MG tablet  Commonly known as:  LASIX  Take 1 tablet (40 mg total) by mouth daily.     Melatonin 3 MG Tabs  Take 6-9 mg by mouth at bedtime as needed (FOR SLEEP).     mesalamine 0.375 G 24 hr capsule  Commonly known as:  APRISO  Take 1,500 mg by mouth daily.     metoprolol tartrate 25 MG tablet  Commonly known as:  LOPRESSOR  Take 1 tablet (25 mg total) by mouth 2 (two) times daily.     multivitamin with minerals Tabs tablet  Take 1 tablet by mouth daily.     oxyCODONE 5 MG immediate release tablet  Commonly known as:  Oxy IR/ROXICODONE  Take 1-2 tablets (5-10 mg total) by mouth every 4 (four) hours as needed for severe pain.     potassium chloride SA 20 MEQ tablet  Commonly known as:  K-DUR,KLOR-CON  Take 1 tablet (20 mEq total) by mouth daily.     tamsulosin 0.4 MG Caps capsule  Commonly known as:  FLOMAX  Take 1 capsule (0.4  mg total) by mouth daily after breakfast.         The results of significant diagnostics from this hospitalization (including imaging, microbiology, ancillary and laboratory) are listed below for reference.    Significant Diagnostic Studies: Dg Chest 1 View  08/31/2015  CLINICAL DATA:  Status post left-sided thoracentesis EXAM: CHEST  1 VIEW COMPARISON:  08/28/2015 FINDINGS: Cardiac shadow remains enlarged. Postsurgical changes are again seen. There is been reduction in the left-sided pleural effusion following thoracentesis. Small persistent right pleural effusion is noted. Left basilar atelectasis is seen. IMPRESSION: Improvement in left pleural effusion.  No pneumothorax is noted. Electronically Signed   By: Alcide Clever M.D.   On: 08/31/2015 11:52   Dg Chest 2 View  08/09/2015  CLINICAL DATA:  Post thoracic aortic aneurysm repair 08/02/2015 EXAM: CHEST  2 VIEW COMPARISON:  08/07/2015 FINDINGS: Enlargement of cardiac silhouette post median sternotomy. Epicardial pacing wires  noted. Stable mediastinal contours and pulmonary vascularity. Bibasilar effusions and atelectasis greater on LEFT little changed. No acute infiltrate or pneumothorax. Bones unremarkable. IMPRESSION: Persistent bibasilar at atelectasis and pleural effusions greater on LEFT, unchanged. Enlargement of cardiac silhouette. Electronically Signed   By: Ulyses Southward M.D.   On: 08/09/2015 09:12   Ct Angio Chest Pe W/cm &/or Wo Cm  08/02/2015  CLINICAL DATA:  59 yr old male with chest pain radiating to left side of jaw. Diaphoretic. Had about a week ago but didn't seek medical attention. No known heart problems. EXAM: CT ANGIOGRAPHY CHEST WITH CONTRAST TECHNIQUE: Multidetector CT imaging of the chest was performed using the standard protocol during bolus administration of intravenous contrast. Multiplanar CT image reconstructions and MIPs were obtained to evaluate the vascular anatomy. CONTRAST:  OMNIPAQUE IOHEXOL 350 MG/ML SOLN COMPARISON:  Current chest radiographs. FINDINGS: Angiographic study: No evidence of a pulmonary embolus. Aorta is not opacified. It is dilated. Ascending aorta measures 4.7 cm in diameter approximately 4 cm above the aortic valve and 5.2 cm at the aortic root. Aortic branch vessels are normal in caliber. Thoracic inlet:  No mass or adenopathy.  Thyroid unremarkable. Mediastinum and hila: Heart mildly enlarged. No mediastinal or hilar masses or pathologically enlarged lymph nodes. Lungs and pleura: Mild dependent lower lobe subsegmental atelectasis. No evidence of pneumonia or edema. No mass or suspicious nodule. No pleural effusion or pneumothorax. Limited upper abdomen: Probable small cyst from the upper pole left kidney. Otherwise unremarkable. Musculoskeletal: Mild degenerative spurring noted along the thoracic spine. No osteoblastic or osteolytic lesions. Review of the MIP images confirms the above findings. IMPRESSION: 1. No evidence of a pulmonary embolus. 2. 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 3. No acute findings. Lungs are clear other than mild dependent subsegmental atelectasis. Electronically Signed   By: Amie Portland M.D.   On: 08/02/2015 20:42   US Renal  08/30/2015  CLINICAL DATA:  Acute renal injury EXAM: RENAL / URINARY TRACT ULTRASOUND COMPLETE COMPARISON:  None. FINDINGS: Right Kidney: Length: 12 cm. A 1.4 cm cyst is noted in the upper pole of the right kidney. No mass lesion or hydronephrosis is noted. Left Kidney: Length: 12.1 cm. Echogenicity within normal limits. No mass or hydronephrosis visualized. Bladder: Decompressed by Foley catheter. Note is made of bilateral pleural effusions. IMPRESSION: Bilateral pleural effusions. Right renal cyst. Electronically Signed   By: Alcide Clever M.D.   On: 08/30/2015 17:11   Dg Chest Port 1 View  08/28/2015  CLINICAL DATA:  Fever.  Tachycardia. EXAM: PORTABLE CHEST 1 VIEW COMPARISON:  08/09/2015 and multiple previous FINDINGS: Previous median sternotomy and CABG. Chronic cardiomegaly and unfolded aorta. Enlarging effusion on the left with worsened volume loss in the left lower lobe. Small effusion on the right appears similar to the previous study. IMPRESSION: Enlarging effusion on the left with worsened volume loss in the left lower lobe. There could certainly be associated pneumonia. Small effusion on the right. Electronically Signed   By: Paulina FusiMark  Shogry M.D.   On: 08/28/2015 17:53   Dg Chest Port 1 View  08/07/2015  CLINICAL DATA:  Aortic dissection . EXAM: PORTABLE CHEST 1 VIEW COMPARISON:  08/06/2015. FINDINGS: Interim removal right IJ sheath. Prior CABG. Cardiomegaly. Left lower lobe atelectasis and/or infiltrate. Small left pleural effusion unchanged. No pneumothorax. IMPRESSION: 1. Interval removal right IJ sheath. 2. Prior median sternotomy. Prior thoracic aneurysm repair. Mediastinum is stable. 3. Persistent left lower lobe atelectasis and/or infiltrate. Small left  pleural effusion. No interim change . Electronically Signed   By: Maisie Fushomas  Register   On: 08/07/2015 07:39   Dg Chest Port 1 View  08/06/2015  CLINICAL DATA:  Aortic dissection. EXAM: PORTABLE CHEST 1 VIEW COMPARISON:  08/05/2015. FINDINGS: Right IJ sheath in stable position. Interval removal of mediastinal drainage catheters. New prior median sternotomy and thoracic aneurysm repair. Mediastinum is stable. Stable cardiomegaly. Persistent left lower lobe atelectasis and/or infiltrate. Improving right base atelectasis. Small left pleural effusion. No pneumothorax. IMPRESSION: 1. Interval removal of mediastinal drainage catheters. Right IJ sheath in stable position. No pneumothorax. 2. Prior median sternotomy and thoracic aneurysm repair. Mediastinum is stable. Stable cardiomegaly. 3. Persistent left lower lobe atelectasis and/or infiltrate. Interim clearing of right base atelectasis. Small left pleural effusion . Electronically Signed   By: Maisie Fushomas  Register   On: 08/06/2015 07:54   Dg Chest Port 1 View  08/05/2015  CLINICAL DATA:  Status post repair of type 1 aortic dissection. EXAM: PORTABLE CHEST 1 VIEW COMPARISON:  08/04/2015 FINDINGS: Endotracheal tube, enteric tube, and Swan-Ganz catheter have been removed. Right jugular sheath remains in place with tip overlying the upper SVC. Mediastinal drains remain in place. Sternotomy wires are again noted. Cardiac silhouette is mildly enlarged. Lung volumes are slightly diminished compared to the prior study. Bibasilar lung opacities and pleural effusions have not significantly changed. No pneumothorax is identified. IMPRESSION: Interval extubation. Persistent bibasilar atelectasis and small pleural effusions. Electronically Signed   By: Sebastian AcheAllen  Grady M.D.   On: 08/05/2015 07:44   Dg Chest Port 1 View  08/04/2015  CLINICAL DATA:  Thoracic ascending aortic aneurysm repair EXAM: PORTABLE CHEST 1 VIEW COMPARISON:  08/03/2015 FINDINGS: Endotracheal tube has been  withdrawn and is now in good position. Swan-Ganz catheter in the main pulmonary artery. Pericardial and mediastinal drains in good position. NG tube in place. Negative for pneumothorax Increase in bibasilar atelectasis and small pleural effusions. Negative for edema. IMPRESSION: Endotracheal tube and support lines in good position Progressive bibasilar atelectasis and small pleural effusions. Electronically Signed   By: Marlan Palauharles  Clark M.D.   On: 08/04/2015 08:10   Dg Chest Port 1 View  08/03/2015  CLINICAL DATA:  59 year old male with a history of type 1 aortic dissection repair EXAM: PORTABLE CHEST 1 VIEW COMPARISON:  Plain film 08/03/2015, CT 08/02/2015 FINDINGS: Early postoperative changes of type 1 aortic dissection repair. Slight right rotation the patient. There is slightly increased diameter of the upper mediastinum on the current plain film compared to the prior CT, which may be  secondary to patient positioning. Unchanged position of endotracheal tube, which terminates just above the carina and is directed towards the right mainstem. At least 3 mediastinal drains/pleural drains project over the mediastinum. Gastric tube terminates within the left upper quadrant, with the side port at the gastroesophageal junction. Unchanged position of the right IJ approach sheath, through which a Swan-Ganz catheter terminates in the region of the pulmonary outflow tract. Surgical changes of median sternotomy. Lung volumes are low with ill-defined opacities at the left greater than right base. IMPRESSION: Early postoperative changes of median sternotomy and a type 1 dissection repair. The superior mediastinum is slightly widened compared to the recent plain film comparison, though this may be secondary to slight right rotation. If there is concern for acute abnormality, correlation with either cardiac echo or contrast-enhanced CT may be useful. Endotracheal tube terminates just above the carina directed towards the right  mainstem. Withdrawal of 3 cm to 7 cm may be useful for better position. Unchanged position of multiple mediastinal/ pleural drains, right IJ sheath through which a Swan-Ganz catheter is transmitted, and gastric tube. Atelectasis at the left base. These results were called by telephone at the time of interpretation on 08/03/2015 at 3:42 pm to the nurse caring for the patient, Ms Louie Bun, who verbally acknowledged these results. Electronically Signed   By: Gilmer Mor D.O.   On: 08/03/2015 15:42   Dg Chest Port 1 View  08/03/2015  CLINICAL DATA:  Postop CABG evaluate for suction instrument 4" long x 0.75" wide EXAM: PORTABLE CHEST 1 VIEW COMPARISON:  08/02/2015 FINDINGS: Swan-Ganz central venous catheter with tip in the right main pulmonary artery. Endotracheal tube with tip about 5 mm above the carina. There are two left chest tubes and a mediastinal drain. There is a nasogastric tube extending off the inferior edge of the film with the side hole over the distal esophagus. There is postoperative hazy attenuation at both lung bases likely representing atelectasis. No evidence postoperative edema or pneumothorax. There is no evidence of the described suction device. IMPRESSION: Two left chest tubes, mediastinal drain, NG tube, ET tube, and swan ganz line. Electronically Signed   By: Esperanza Heir M.D.   On: 08/03/2015 10:07   US Thoracentesis Asp Pleural Space W/img Guide  08/31/2015  INDICATION: Symptomatic Left sided pleural effusion Post aortic dissection repair 1 month ago EXAM: US THORACENTESIS ASP PLEURAL SPACE W/IMG GUIDE COMPARISON:  None. MEDICATIONS: 10 cc 1% lidocaine COMPLICATIONS: None immediate TECHNIQUE: Informed written consent was obtained from the patient after a discussion of the risks, benefits and alternatives to treatment. A timeout was performed prior to the initiation of the procedure. Initial ultrasound scanning demonstrates a left pleural effusion. The lower chest was prepped  and draped in the usual sterile fashion. 1% lidocaine was used for local anesthesia. Under direct ultrasound guidance, a 19 gauge, 7-cm, Yueh catheter was introduced. An ultrasound image was saved for documentation purposes. The thoracentesis was performed. The catheter was removed and a dressing was applied. The patient tolerated the procedure well without immediate post procedural complication. The patient was escorted to have an upright chest radiograph. FINDINGS: A total of approximately 180 cc of bloody fluid was removed. Requested samples were sent to the laboratory. IMPRESSION: Successful ultrasound-guided left sided thoracentesis yielding 180 cc of pleural fluid. Read by:  Robet Leu [facility] Electronically Signed   By: Richarda Overlie M.D.   On: 08/31/2015 13:33     Microbiology: Recent Results (from the past 240  hour(s))  Urine culture     Status: None   Collection Time: 08/28/15 11:37 AM  Result Value Ref Range Status   Specimen Description URINE, RANDOM  Final   Special Requests NONE  Final   Culture >=100,000 COLONIES/mL KLEBSIELLA PNEUMONIAE  Final   Report Status 08/30/2015 FINAL  Final   Organism ID, Bacteria KLEBSIELLA PNEUMONIAE  Final      Susceptibility   Klebsiella pneumoniae - MIC*    AMPICILLIN >=32 RESISTANT Resistant     CEFAZOLIN <=4 SENSITIVE Sensitive     CEFTRIAXONE <=1 SENSITIVE Sensitive     CIPROFLOXACIN <=0.25 SENSITIVE Sensitive     GENTAMICIN <=1 SENSITIVE Sensitive     IMIPENEM <=0.25 SENSITIVE Sensitive     NITROFURANTOIN 64 INTERMEDIATE Intermediate     TRIMETH/SULFA <=20 SENSITIVE Sensitive     AMPICILLIN/SULBACTAM 4 SENSITIVE Sensitive     PIP/TAZO 8 SENSITIVE Sensitive     * >=100,000 COLONIES/mL KLEBSIELLA PNEUMONIAE  Blood Culture (routine x 2)     Status: None   Collection Time: 08/28/15  1:20 PM  Result Value Ref Range Status   Specimen Description BLOOD RIGHT ARM  Final   Special Requests BOTTLES DRAWN AEROBIC ONLY 5CC  Final   Culture  Setup  Time   Final    GRAM NEGATIVE RODS AEROBIC BOTTLE ONLY CRITICAL RESULT CALLED TO, READ BACK BY AND VERIFIED WITH: K HASTINGS,RN AT 8413 08/29/15 BY L BENFIELD    Culture KLEBSIELLA PNEUMONIAE  Final   Report Status 09/01/2015 FINAL  Final   Organism ID, Bacteria KLEBSIELLA PNEUMONIAE  Final      Susceptibility   Klebsiella pneumoniae - MIC*    AMPICILLIN >=32 RESISTANT Resistant     CEFAZOLIN <=4 SENSITIVE Sensitive     CEFEPIME <=1 SENSITIVE Sensitive     CEFTAZIDIME <=1 SENSITIVE Sensitive     CEFTRIAXONE <=1 SENSITIVE Sensitive     CIPROFLOXACIN <=0.25 SENSITIVE Sensitive     GENTAMICIN <=1 SENSITIVE Sensitive     IMIPENEM <=0.25 SENSITIVE Sensitive     TRIMETH/SULFA <=20 SENSITIVE Sensitive     AMPICILLIN/SULBACTAM 4 SENSITIVE Sensitive     PIP/TAZO <=4 SENSITIVE Sensitive     * KLEBSIELLA PNEUMONIAE  Blood Culture (routine x 2)     Status: None (Preliminary result)   Collection Time: 08/28/15  1:26 PM  Result Value Ref Range Status   Specimen Description BLOOD LEFT HAND  Final   Special Requests BOTTLES DRAWN AEROBIC ONLY 5CC  Final   Culture  Setup Time   Final    GRAM NEGATIVE RODS AEROBIC BOTTLE ONLY CRITICAL RESULT CALLED TO, READ BACK BY AND VERIFIED WITH: SABITA @0450  08/29/15 MKELLY    Culture   Final    KLEBSIELLA PNEUMONIAE SUSCEPTIBILITIES PERFORMED ON PREVIOUS CULTURE WITHIN THE LAST 5 DAYS.    Report Status PENDING  Incomplete  Culture, blood (x 2)     Status: None (Preliminary result)   Collection Time: 08/28/15  5:40 PM  Result Value Ref Range Status   Specimen Description BLOOD RIGHT ANTECUBITAL  Final   Special Requests BOTTLES DRAWN AEROBIC ONLY 6CC  Final   Culture NO GROWTH 4 DAYS  Final   Report Status PENDING  Incomplete  Culture, blood (x 2)     Status: None (Preliminary result)   Collection Time: 08/28/15  5:45 PM  Result Value Ref Range Status   Specimen Description BLOOD LEFT HAND  Final   Special Requests BOTTLES DRAWN AEROBIC ONLY 5CC   Final  Culture NO GROWTH 4 DAYS  Final   Report Status PENDING  Incomplete  Culture, body fluid-bottle     Status: None (Preliminary result)   Collection Time: 08/31/15 10:37 AM  Result Value Ref Range Status   Specimen Description FLUID PLEURAL LEFT  Final   Special Requests NONE  Final   Culture NO GROWTH < 24 HOURS  Final   Report Status PENDING  Incomplete  Gram stain     Status: None   Collection Time: 08/31/15 10:37 AM  Result Value Ref Range Status   Specimen Description FLUID PLEURAL LEFT  Final   Special Requests NONE  Final   Gram Stain   Final    MODERATE WBC PRESENT,BOTH PMN AND MONONUCLEAR NO ORGANISMS SEEN    Report Status 08/31/2015 FINAL  Final     Labs: Basic Metabolic Panel:  Recent Labs Lab 08/28/15 1304 08/29/15 0605 08/30/15 0444 08/31/15 0540 09/01/15 0530  NA 139 139 137 139 139  K 4.1 4.1 3.3* 3.7 3.7  CL 105 107 106 107 107  CO2 24 24 23 22 25   GLUCOSE 86 101* 130* 105* 97  BUN 17 17 15 16 14   CREATININE 1.24 1.15 1.39* 1.25* 1.05  CALCIUM 9.3 8.3* 8.1* 8.4* 8.8*  MG  --   --   --  1.9 2.0   Liver Function Tests:  Recent Labs Lab 08/28/15 1304  AST 20  ALT 16*  ALKPHOS 100  BILITOT 0.7  PROT 6.1*  ALBUMIN 3.5   No results for input(s): LIPASE, AMYLASE in the last 168 hours. No results for input(s): AMMONIA in the last 168 hours. CBC:  Recent Labs Lab 08/28/15 1304 08/29/15 0605 08/30/15 0444 08/31/15 0540 09/01/15 0530  WBC 14.2* 16.0* 11.4* 8.2 6.6  NEUTROABS 12.7*  --   --   --   --   HGB 9.8* 8.4* 8.3* 8.7* 8.2*  HCT 31.1* 27.4* 26.7* 28.3* 25.8*  MCV 88.6 89.3 88.4 90.4 88.7  PLT 190 154 142* 140* 145*   Cardiac Enzymes: No results for input(s): CKTOTAL, CKMB, CKMBINDEX, TROPONINI in the last 168 hours. BNP: Invalid input(s): POCBNP CBG: No results for input(s): GLUCAP in the last 168 hours.  Time coordinating discharge:  Greater than 30 minutes  Signed:  Jenyfer Trawick, DO [facility] Pager:  [phone] 09/01/2015, 5:51 PM

## 2015-09-02 LAB — CULTURE, BLOOD (ROUTINE X 2)
CULTURE: NO GROWTH
Culture: NO GROWTH

## 2015-09-05 LAB — CULTURE, BODY FLUID W GRAM STAIN -BOTTLE

## 2015-09-05 LAB — CULTURE, BODY FLUID-BOTTLE: CULTURE: NO GROWTH

## 2015-09-14 ENCOUNTER — Other Ambulatory Visit: Payer: Self-pay | Admitting: Thoracic Surgery (Cardiothoracic Vascular Surgery)

## 2015-09-14 DIAGNOSIS — I7101 Dissection of thoracic aorta: Secondary | ICD-10-CM

## 2015-09-14 DIAGNOSIS — I71019 Dissection of thoracic aorta, unspecified: Secondary | ICD-10-CM

## 2015-09-15 ENCOUNTER — Other Ambulatory Visit: Payer: Self-pay | Admitting: *Deleted

## 2015-09-15 ENCOUNTER — Ambulatory Visit (INDEPENDENT_AMBULATORY_CARE_PROVIDER_SITE_OTHER): Payer: Non-veteran care | Admitting: Thoracic Surgery (Cardiothoracic Vascular Surgery)

## 2015-09-15 ENCOUNTER — Encounter: Payer: Self-pay | Admitting: Thoracic Surgery (Cardiothoracic Vascular Surgery)

## 2015-09-15 VITALS — BP 113/62 | HR 86 | Resp 16 | Ht 72.0 in | Wt 198.0 lb

## 2015-09-15 DIAGNOSIS — I7101 Dissection of thoracic aorta: Secondary | ICD-10-CM

## 2015-09-15 DIAGNOSIS — J986 Disorders of diaphragm: Secondary | ICD-10-CM

## 2015-09-15 DIAGNOSIS — I71019 Dissection of thoracic aorta, unspecified: Secondary | ICD-10-CM

## 2015-09-15 DIAGNOSIS — I4891 Unspecified atrial fibrillation: Secondary | ICD-10-CM

## 2015-09-15 MED ORDER — AMIODARONE HCL 200 MG PO TABS: 200.0000 mg | ORAL_TABLET | Freq: Every day | ORAL | Status: DC

## 2015-09-15 NOTE — Progress Notes (Signed)
[address]       [place] [address]             [phone]       HPI: Mr. Holzhauer turns for a scheduled postoperative follow-up visit.  He is a 59 year old man who presented on October 16 with chest pain. 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 His initial postoperative course was marked by persistent atrial flutter which eventually required DC cardioversion. He was discharged on amiodarone and Eliquis.  He was readmitted with urinary retention and a Klebsiella urinary tract infection. He was treated with Zosyn intravenously initially and then discharged on Cipro 500 mg by mouth twice a day. He is on his last day of the ciprofloxacin.  He was seen at the [facility] in [place] last week. He had an EKG and echocardiogram. I do not have those results.  He says he is not having much pain. He is no longer using oxycodone. He has been driving his car without difficulty. He does not feel any clicking or popping in the sternum. He does still tire relatively easily. He has not had any swelling in his legs. His urinary retention is improved. He never did see a urologist.  Past Medical History  Diagnosis Date  . 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 HYPERLIPIDEMIA 06/08/2007  . HYPOGONADISM, MALE 10/10/2007  . INSOMNIA-SLEEP DISORDER-UNSPEC 10/10/2007  . LOW BACK PAIN 06/08/2007  . WEAKNESS, LEFT SIDE OF BODY 10/10/2007  . Hypertension   . Chronic systolic CHF (congestive heart failure) (HCC) 08/29/2015       Current Outpatient Prescriptions  Medication Sig Dispense Refill  . acetaminophen (TYLENOL) 500 MG tablet Take 2 tablets (1,000 mg total) by mouth every 6 (six) hours as needed for mild pain. 30 tablet 0  . amiodarone (PACERONE) 200 MG tablet Take 1 tablet (200 mg total) by mouth daily. 60 tablet 0  . apixaban (ELIQUIS) 5 MG TABS tablet Take 1 tablet (5 mg  total) by mouth 2 (two) times daily. 60 tablet 1  . aspirin EC 81 MG EC tablet Take 1 tablet (81 mg total) by mouth daily.    Marland Kitchen atorvastatin (LIPITOR) 80 MG tablet Take 1 tablet (80 mg total) by mouth daily at 6 PM. 30 tablet 1  . ferrous sulfate 325 (65 FE) MG EC tablet Take 325 mg by mouth daily with breakfast.    . Melatonin 3 MG TABS Take 6-9 mg by mouth at bedtime as needed (FOR SLEEP).    . mesalamine (APRISO) 0.375 G 24 hr capsule Take 1,500 mg by mouth daily.    . metoprolol tartrate (LOPRESSOR) 25 MG tablet Take 1 tablet (25 mg total) by mouth 2 (two) times daily. (Patient taking differently: Take 25 mg by mouth 2 (two) times daily as needed. For high readings of blood pressure) 60 tablet 1  . Multiple Vitamin (MULTIVITAMIN WITH MINERALS) TABS tablet Take 1 tablet by mouth daily.    . tamsulosin (FLOMAX) 0.4 MG CAPS capsule Take 1 capsule (0.4 mg total) by mouth daily after breakfast. 30 capsule 1  . oxyCODONE (OXY IR/ROXICODONE) 5 MG immediate release tablet Take 1-2 tablets (5-10 mg total) by mouth every 4 (four) hours as needed for severe pain. (Patient not taking: Reported on 09/15/2015) 30 tablet 0   No current facility-administered medications for this visit.    Physical Exam BP  113/62 mmHg  Pulse 86  Resp 16  Ht 6' (1.829 m)  Wt 198 lb (89.812 kg)  BMI 26.85 kg/m2  SpO282 198% 59 year old man in no acute distress Alert and oriented 3 with no focal neurologic deficit Cardiac regular rate and rhythm normal S1 and S2, 2/6 diastolic murmur Lungs diminished breath sounds in left base, otherwise clear No peripheral edema Sternal incision clean dry and intact, sternum stable  Diagnostic Tests: None  Impression: 59 year old gentleman who is status post repair of a type I aortic dissection with resuspension of his aortic valve. He did have replacement of his arch with grafting individually to the innominate and left common carotid arteries. His postoperative course was,  complicated by atrial fibrillation and flutter which eventually required DC cardioversion. He also had a readmission with urinary retention and a Klebsiella urinary tract infection. That has been treated with 2 weeks of antibiotics  He currently is doing well. He's having minimal discomfort. He is fine to drive a car at this point. 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 There is no longer restriction on the amount of weight he can lift, but he was cautioned to build into new activities gradually.  His blood pressure is well controlled.  Per guidelines he needs a baseline CT angiogram of the chest abdomen and pelvis about a month out from surgery. We will go ahead and schedule that. He will need a another scan in a couple of months to assess for stability.  He is out of his amiodarone. I gave him a new prescription for 2 more months of that. He should remain on the blood thinner for now as well. He has no signs of congestive heart failure. I think we can safely stop the Lasix and potassium at this point. He was advised that if he notices an increase in weight of 3 pounds a day or 5 pounds in 3 days, he should contact us about restarting diuretic.  Plan: Continue amiodarone and Eliquis Continue metoprolol Hold Lasix and potassium Obtain EKG and echo results from dura MVA CT angiogram of chest abdomen and pelvis in the next week or 2 for one month baseline. CT angiogram of chest abdomen and pelvis in 2 months for his 3 month study. We will need CT angiograms 1, 3, 6, 12 months, and then every 6 months thereafter. [facility].   Loreli SlotSteven C Dayanne Yiu, MD [facility] [phone]

## 2015-09-24 ENCOUNTER — Other Ambulatory Visit: Payer: Non-veteran care

## 2015-09-25 ENCOUNTER — Other Ambulatory Visit: Payer: Non-veteran care

## 2015-11-17 ENCOUNTER — Ambulatory Visit: Payer: Non-veteran care | Admitting: Thoracic Surgery (Cardiothoracic Vascular Surgery)

## 2016-09-29 DIAGNOSIS — Z736 Limitation of activities due to disability: Secondary | ICD-10-CM

## 2017-06-28 ENCOUNTER — Telehealth (HOSPITAL_COMMUNITY): Payer: Self-pay

## 2017-06-28 ENCOUNTER — Encounter (HOSPITAL_COMMUNITY): Payer: Self-pay

## 2017-06-28 NOTE — Telephone Encounter (Signed)
I called patient to discuss scheduling for cardiac rehab. Patient phone number that we have on file and that I called [phone] is a fax number after ringing several times. I will mail patient a letter with information about cardiac rehab.

## 2017-07-21 IMAGING — US US THORACENTESIS ASP PLEURAL SPACE W/IMG GUIDE
1 series · 4 of 4 positions shown · non-contrast
Comparison: None.

MEDICATIONS:
10 cc 1% lidocaine

COMPLICATIONS:
None immediate

INDICATION: Symptomatic Left sided pleural effusion

Post aortic dissection repair 1 month ago
EXAM:
US THORACENTESIS ASP PLEURAL SPACE W/IMG GUIDE
TECHNIQUE: Informed written consent was obtained from the patient after a
discussion of the risks, benefits and alternatives to treatment. A
timeout was performed prior to the initiation of the procedure.

[Series 1: us thoracentesis asp pleural space w/img guide · 0.23mm/px · 4 of 4 slices shown]
[im 1/4]
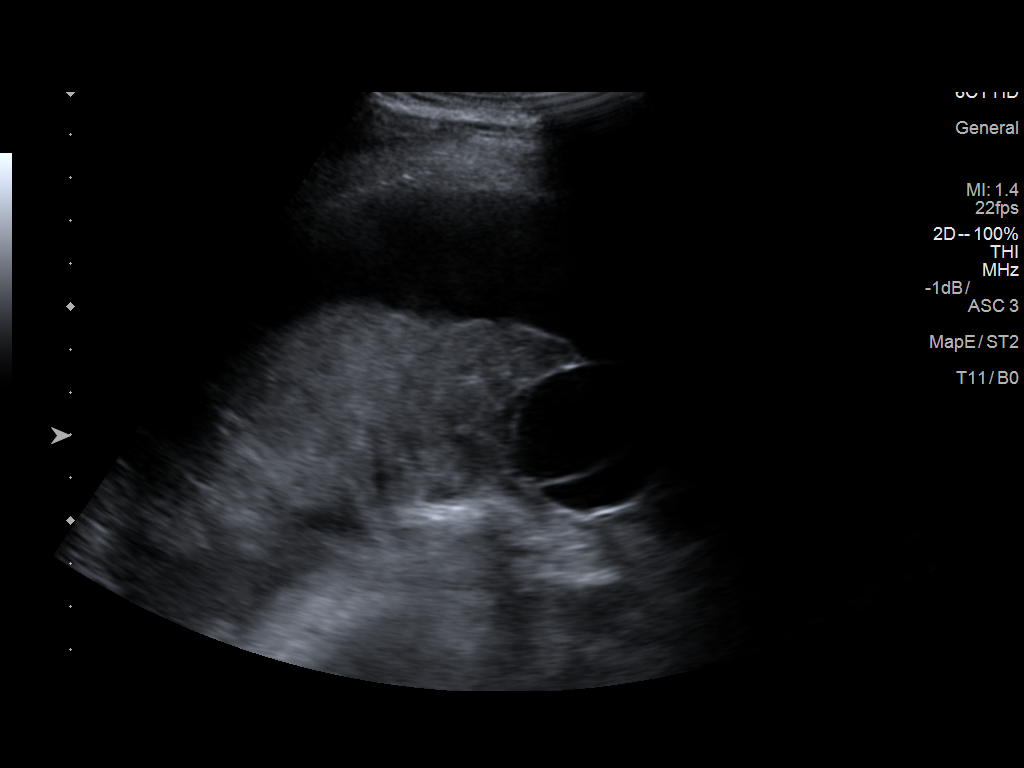
[im 2/4]
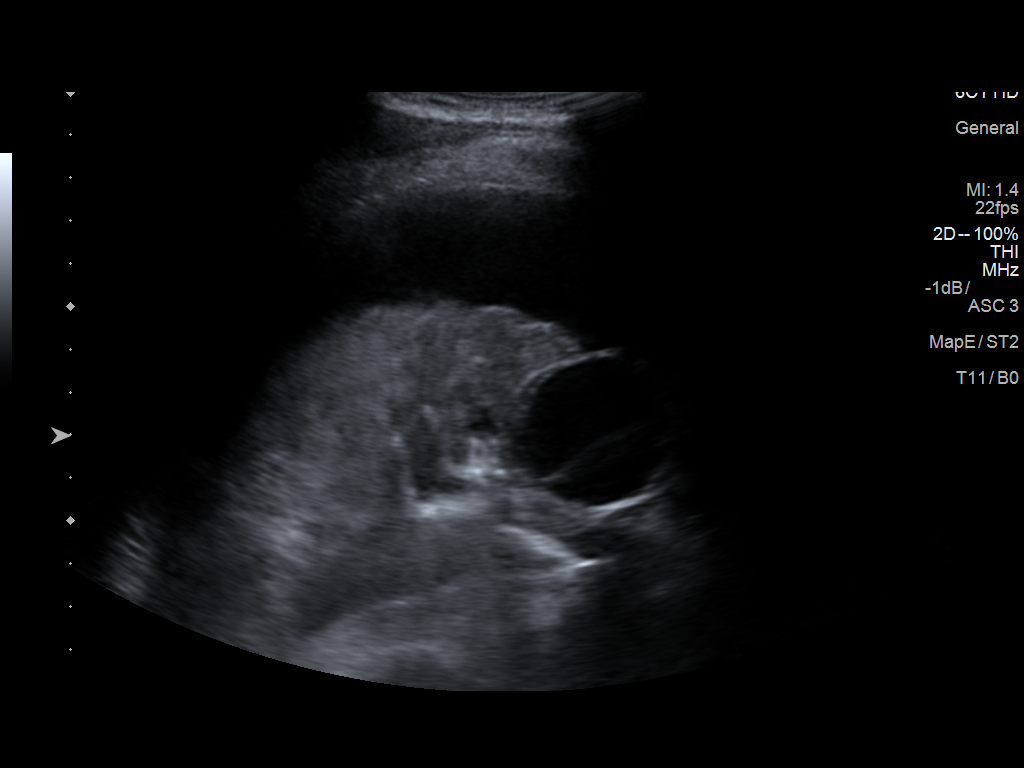
[im 3/4]
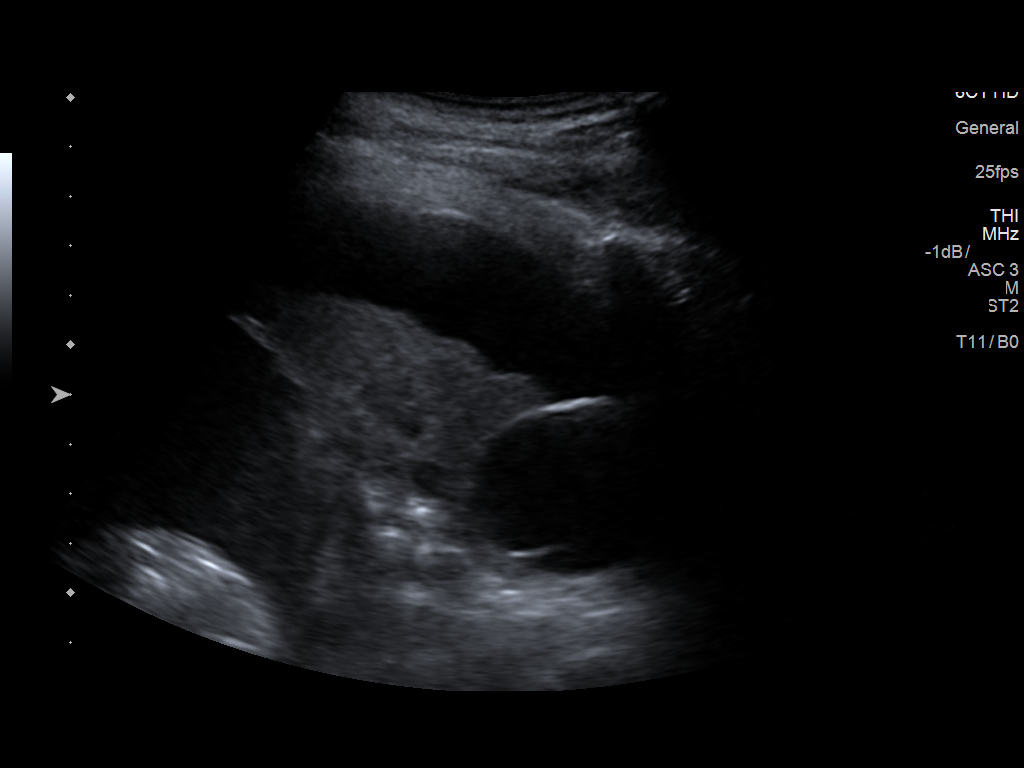
[im 4/4]
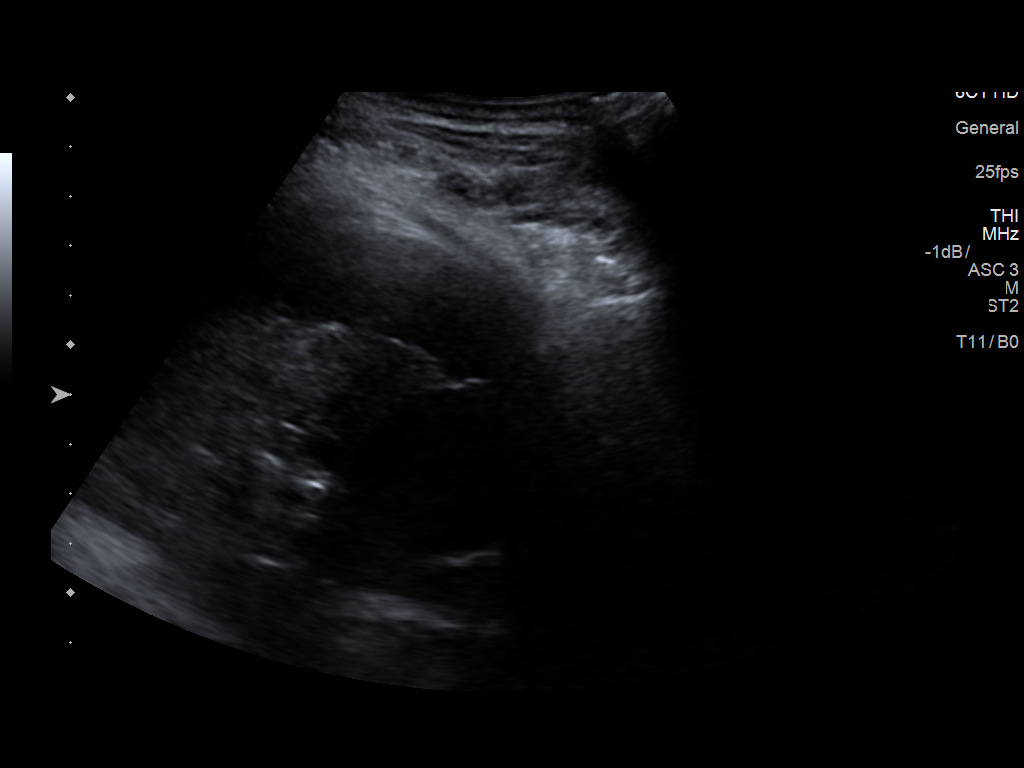

[4 of 4 positions shown; findings below may reference images not displayed]

Initial ultrasound scanning demonstrates a left pleural effusion.
The lower chest was prepped and draped in the usual sterile fashion.
1% lidocaine was used for local anesthesia.

Under direct ultrasound guidance, a 19 gauge, 7-cm, Yueh catheter
was introduced. An ultrasound image was saved for documentation
purposes. The thoracentesis was performed. The catheter was removed
and a dressing was applied. The patient tolerated the procedure well
without immediate post procedural complication. The patient was
escorted to have an upright chest radiograph.
FINDINGS: A total of approximately 180 cc of bloody fluid was removed.
Requested samples were sent to the laboratory.
IMPRESSION: Successful ultrasound-guided left sided thoracentesis yielding 180
cc of pleural fluid.

Read by:  Josue Benjamin Idelfonso

## 2018-07-11 ENCOUNTER — Other Ambulatory Visit (HOSPITAL_COMMUNITY): Payer: Self-pay | Admitting: Medical

## 2018-07-11 DIAGNOSIS — N183 Chronic kidney disease, stage 3 unspecified: Secondary | ICD-10-CM

## 2018-07-24 ENCOUNTER — Ambulatory Visit (HOSPITAL_COMMUNITY)
Admission: RE | Admit: 2018-07-24 | Discharge: 2018-07-24 | Disposition: A | Discharge status: Home / Self Care | Payer: No Typology Code available for payment source | Source: Ambulatory Visit | Attending: Medical | Admitting: Medical

## 2018-07-24 DIAGNOSIS — N183 Chronic kidney disease, stage 3 unspecified: Secondary | ICD-10-CM

## 2018-07-24 DIAGNOSIS — N400 Enlarged prostate without lower urinary tract symptoms: Secondary | ICD-10-CM | POA: Diagnosis not present

## 2018-07-24 DIAGNOSIS — N2889 Other specified disorders of kidney and ureter: Secondary | ICD-10-CM | POA: Insufficient documentation

## 2018-07-24 DIAGNOSIS — N281 Cyst of kidney, acquired: Secondary | ICD-10-CM | POA: Diagnosis not present

## 2020-06-13 IMAGING — US US RENAL
1 series · 14 of 25 positions shown · non-contrast
Comparison: Urinary tract ultrasound 08/30/2015. CT abdomen and
pelvis 04/22/2005.

CLINICAL DATA: Stage 3 chronic kidney disease.

EXAM:
RENAL / URINARY TRACT ULTRASOUND COMPLETE

[Series 1: us renal · 0.24mm/px · 14 of 41 slices shown]
[im 1/41]
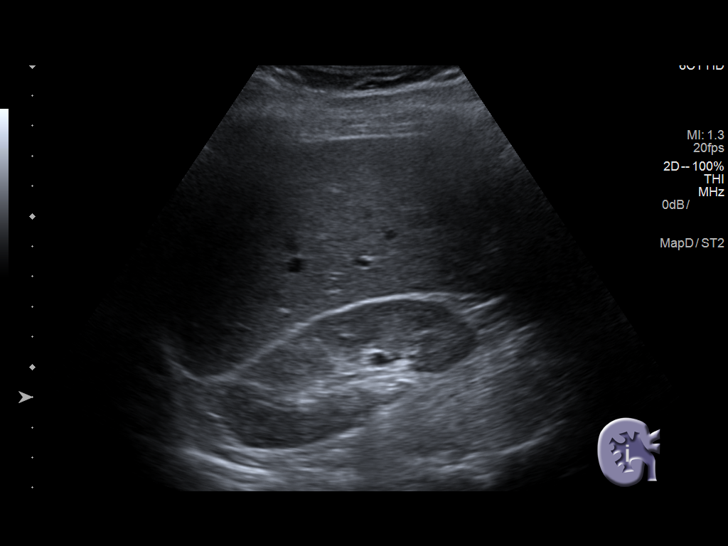
[im 4/41]
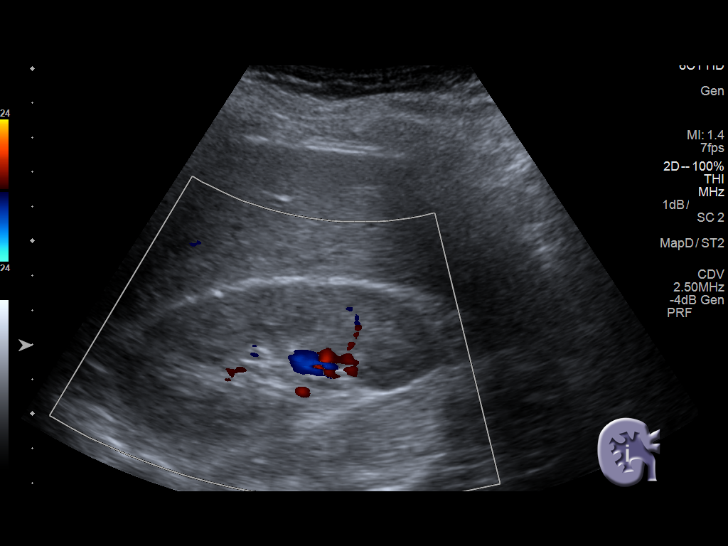
[im 7/41]
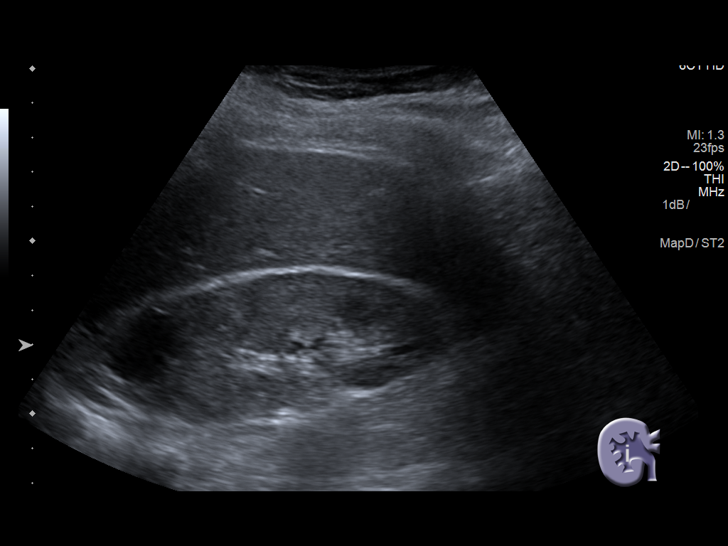
[im 11/41]
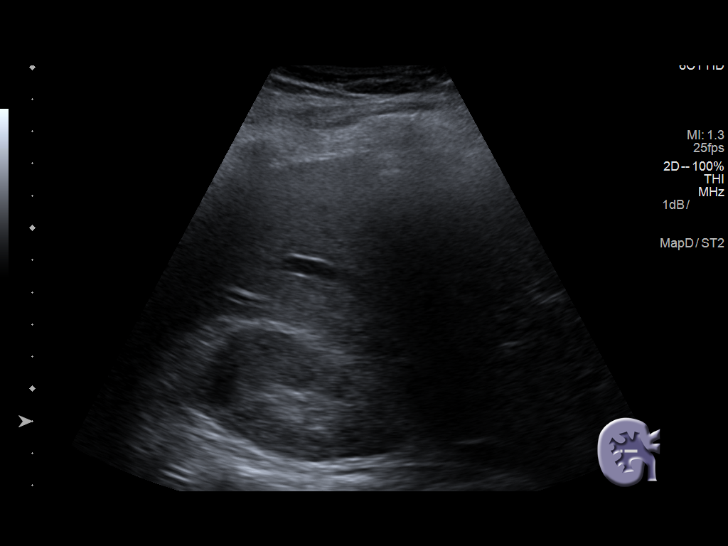
[im 14/41]
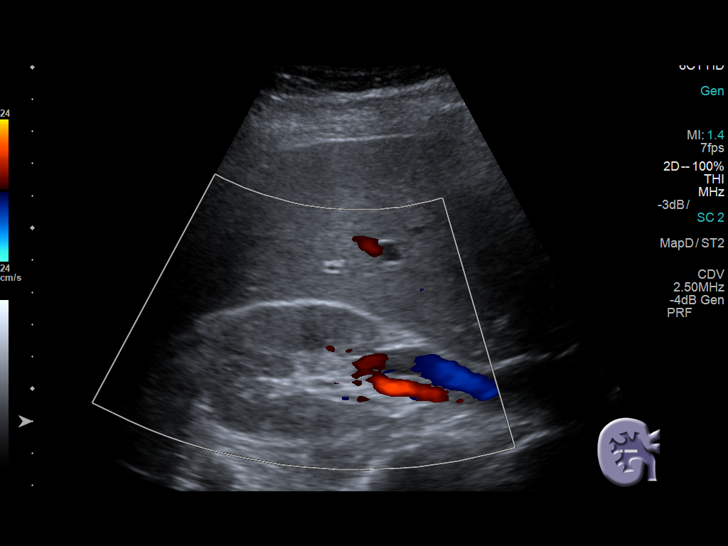
[im 16/41]
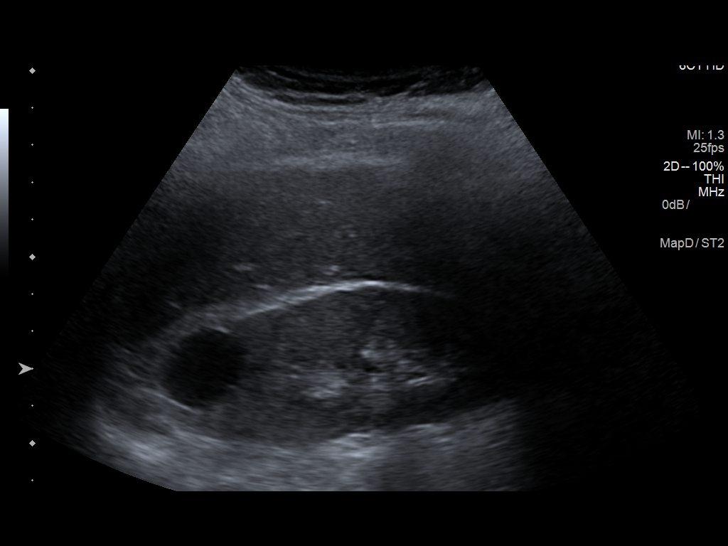
[im 19/41]
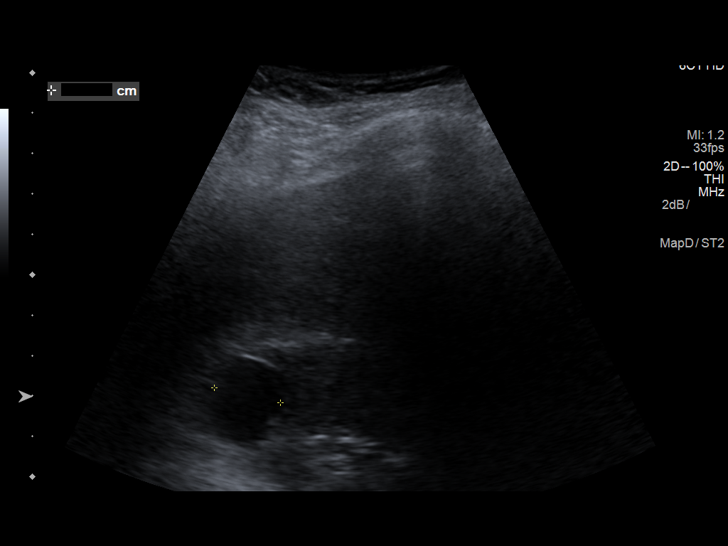
[im 22/41]
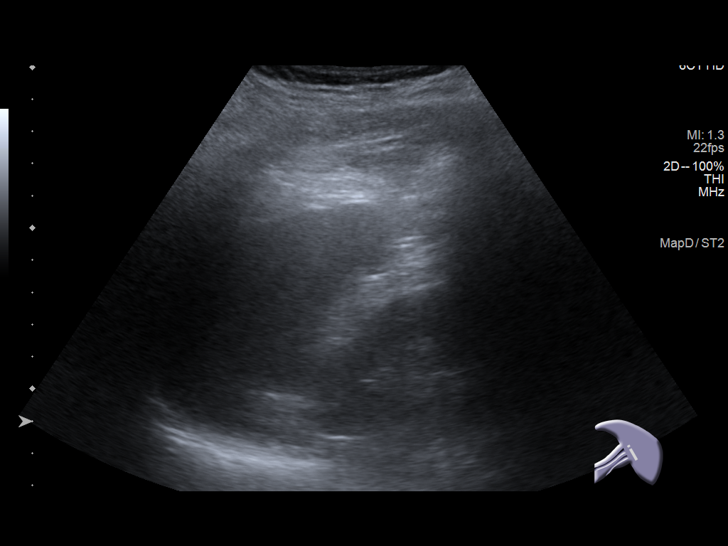
[im 26/41]
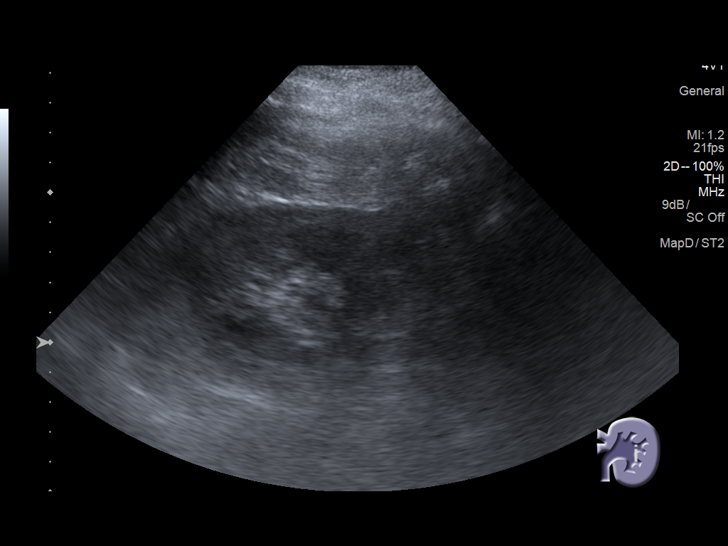
[im 27/41]
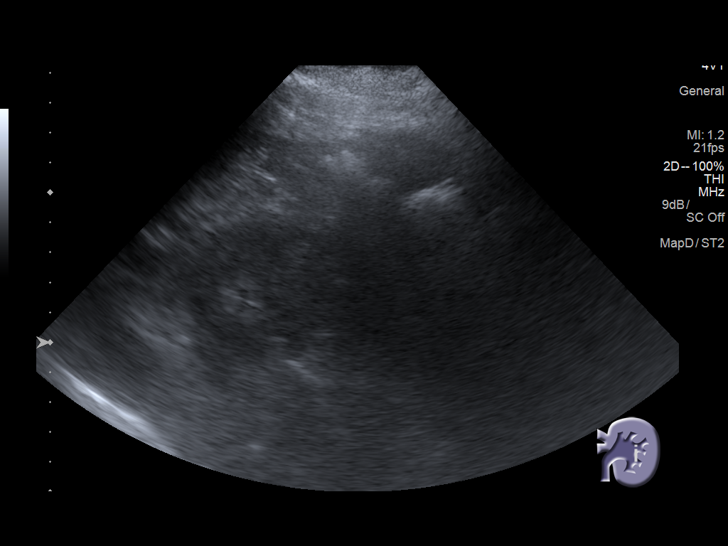
[im 31/41]
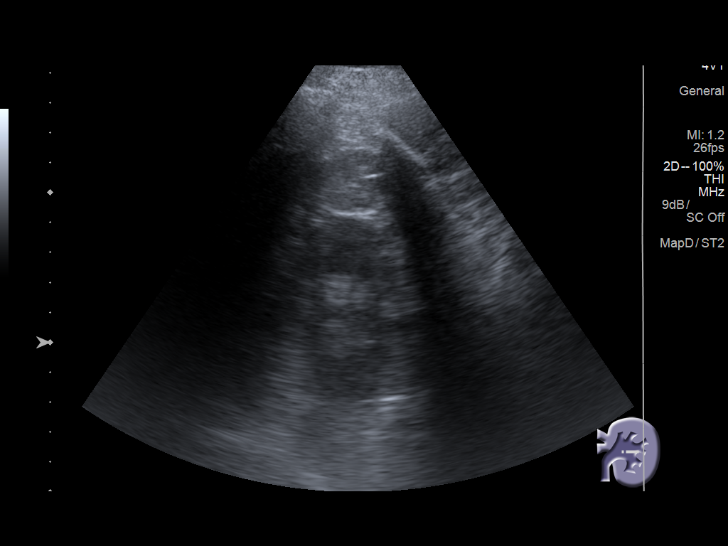
[im 34/41]
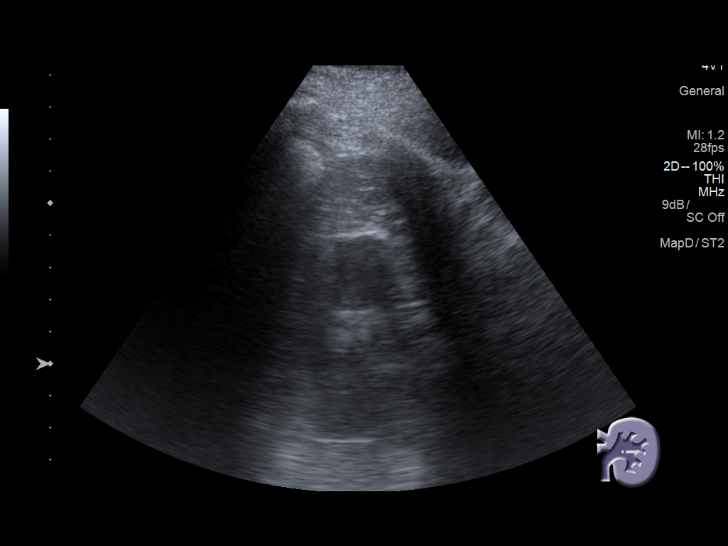
[im 37/41]
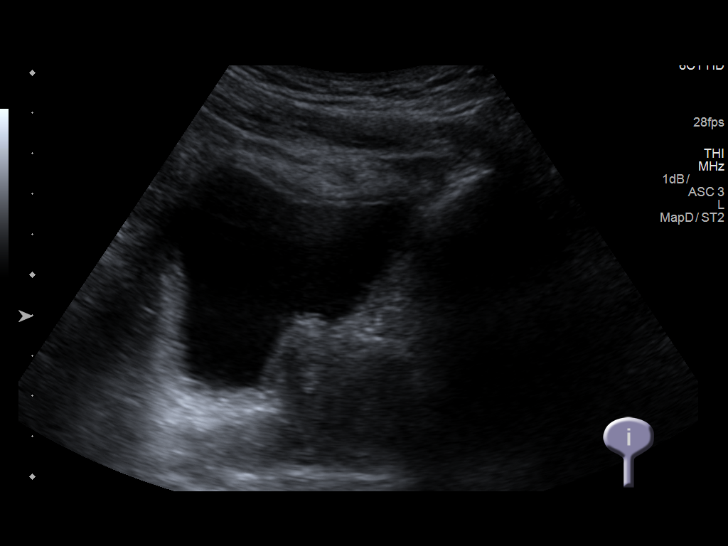
[im 41/41]
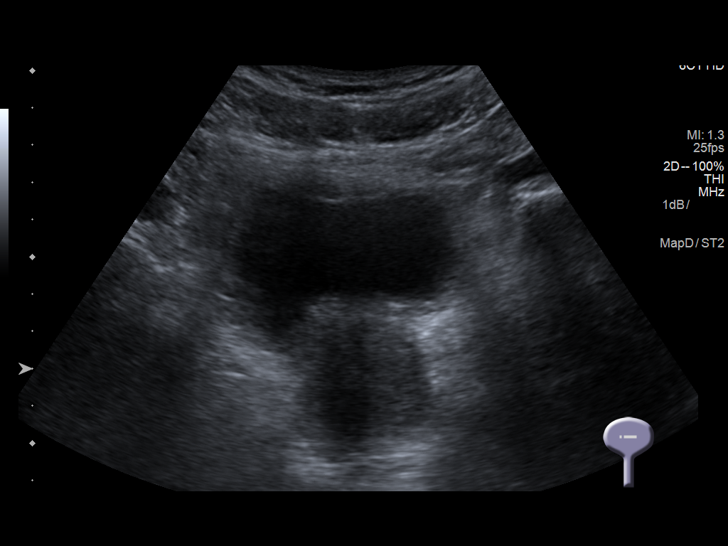

[14 of 25 positions shown; findings below may reference images not displayed]

FINDINGS: Right Kidney:

Length: Approximately 10.7 cm. No hydronephrosis. Mildly echogenic
parenchyma. Well-preserved cortex. Anechoic mass measuring
approximately 2.4 cm demonstrating posterior acoustic enhancement
arising from the UPPER pole as noted previously. No solid renal
mass.

Left Kidney:

Length: Approximately 12.3 cm. No hydronephrosis. Mildly echogenic
parenchyma. Well-preserved cortex. No focal parenchymal abnormality.

Bladder:

Normal for degree of bladder distention. Median lobe prostate gland
enlargement which indents the base of the bladder.
IMPRESSION: 1. Echogenic renal parenchyma indicating chronic kidney disease. No
hydronephrosis.
2. Benign cyst arising from the UPPER pole of the RIGHT kidney.
3. Median lobe prostate gland enlargement.

## 2021-07-13 ENCOUNTER — Other Ambulatory Visit: Payer: Self-pay

## 2021-07-13 ENCOUNTER — Ambulatory Visit (HOSPITAL_COMMUNITY)
Admission: EM | Admit: 2021-07-13 | Discharge: 2021-07-13 | Disposition: A | Discharge status: Home / Self Care | Payer: Medicare Other | Attending: Student | Admitting: Student

## 2021-07-13 ENCOUNTER — Encounter (HOSPITAL_COMMUNITY): Payer: Self-pay | Admitting: Emergency Medicine

## 2021-07-13 DIAGNOSIS — N3001 Acute cystitis with hematuria: Secondary | ICD-10-CM | POA: Diagnosis present

## 2021-07-13 LAB — POCT URINALYSIS DIPSTICK, ED / UC
Bilirubin Urine: NEGATIVE
Glucose, UA: NEGATIVE mg/dL
Ketones, ur: NEGATIVE mg/dL
Nitrite: POSITIVE — AB
Protein, ur: >=300 mg/dL — AB
Specific Gravity, Urine: 1.02 (ref 1.005–1.030)
Urobilinogen, UA: 2 mg/dL — ABNORMAL HIGH (ref 0.0–1.0)
pH: 8.5 — ABNORMAL HIGH (ref 5.0–8.0)

## 2021-07-13 MED ORDER — CIPROFLOXACIN HCL 250 MG PO TABS: 250.0000 mg | ORAL_TABLET | Freq: Two times a day (BID) | ORAL | 0 refills | Status: DC

## 2021-07-13 NOTE — ED Provider Notes (Signed)
[facility]    CSN: [id number] Arrival date & time: 07/13/21  1009      History   Chief Complaint Chief Complaint  Patient presents with   Dysuria   Testicle Pain   Nocturia    HPI Ryan TAMBURRINO is a 65 y.o. male presenting with urinary symptoms for about 2 days.  Complex medical history as below, including CHF, CVA, hypertension, low back pain.  Here today with wife.  They describe 2 days of urinary frequency, odor, dysuria, worsening lower back pain, pain radiating to groin, suprapubic pressure.  Denies penile discharge, testicular swelling, rash.  Denies fever/chills.  HPI  Past Medical History:  Diagnosis Date   CEREBROVASCULAR ACCIDENT, HX OF 06/08/2007   Chronic systolic CHF (congestive heart failure) (HCC) 08/29/2015   DYSPHORIA 10/10/2007   HYPERLIPIDEMIA 06/08/2007   Hypertension    HYPOGONADISM, MALE 10/10/2007   INSOMNIA-SLEEP DISORDER-UNSPEC 10/10/2007   LOW BACK PAIN 06/08/2007   WEAKNESS, LEFT SIDE OF BODY 10/10/2007    Patient Active Problem List   Diagnosis Date Noted   AKI (acute kidney injury) (HCC) 08/30/2015   Sepsis due to Klebsiella pneumoniae (HCC) 08/30/2015   Pleural effusion on left    Gram-negative bacteremia 08/29/2015   Chronic systolic CHF (congestive heart failure) (HCC) 08/29/2015   Urinary tract infectious disease    Urinary tract infection 08/28/2015   Sepsis (HCC) 08/28/2015   Leukocytosis 08/28/2015   Sinus tachycardia 08/28/2015   UTI (lower urinary tract infection) 08/28/2015   Urinary retention 08/28/2015   Abnormal echocardiogram    Elevated troponin    HTN (hypertension) 08/09/2015   Ulcerative colitis (HCC) 08/09/2015   Atrial fibrillation (HCC) 08/09/2015   Thoracic aortic dissection (HCC) 08/03/2015   Abdominal pain, other specified site 05/30/2011   Preventative health care 05/28/2011   HYPOGONADISM, MALE 10/10/2007   INSOMNIA-SLEEP DISORDER-UNSPEC 10/10/2007   DYSPHORIA 10/10/2007   WEAKNESS, LEFT SIDE  OF BODY 10/10/2007   HYPERLIPIDEMIA 06/08/2007   LOW BACK PAIN 06/08/2007   CEREBROVASCULAR ACCIDENT, HX OF 06/08/2007    Past Surgical History:  Procedure Laterality Date   ABDOMINAL SURGERY  2014   removal of benign colon mass    achilles     CARDIAC CATHETERIZATION N/A 08/11/2015   Procedure: Left Heart Cath and Coronary Angiography;  Surgeon: Corky Crafts, MD;  Location: [facility] INVASIVE CV LAB;  Service: Cardiovascular;  Laterality: N/A;   CARDIOVERSION N/A 08/14/2015   Procedure: CARDIOVERSION;  Surgeon: Lewayne Bunting, MD;  Location: [facility] ENDOSCOPY;  Service: Cardiovascular;  Laterality: N/A;   TEE WITHOUT CARDIOVERSION N/A 08/14/2015   Procedure: TRANSESOPHAGEAL ECHOCARDIOGRAM (TEE);  Surgeon: Lewayne Bunting, MD;  Location: [facility] ENDOSCOPY;  Service: Cardiovascular;  Laterality: N/A;   THORACIC AORTIC ANEURYSM REPAIR N/A 08/02/2015   Procedure: THORACIC ASCENDING ANEURYSM REPAIR (AAA);  Surgeon: Loreli Slot, MD;  Location: [facility] OR;  Service: Open Heart Surgery;  Laterality: N/A;       Home Medications    Prior to Admission medications   Medication Sig Start Date End Date Taking? Authorizing Provider  ciprofloxacin (CIPRO) 250 MG tablet Take 1 tablet (250 mg total) by mouth every 12 (twelve) hours for 7 days. 07/13/21 07/20/21 Yes Rhys Martini, PA-C  furosemide (LASIX) 20 MG tablet TAKE ONE TABLET BY MOUTH EVERY DAY AS NEEDED FOR BLOOD PRESSURE AND FOR FLUID CONTROL USE IF GAINING MORE THAN 2.5 POUNDS IN 1 DAY OR MORE THAN 5 POUNDS WITHIN 5 DAYS. 08/19/20  Yes [provider]  lisinopril (ZESTRIL) 40 MG tablet TAKE ONE-HALF TABLET BY MOUTH EVERY DAY FOR BLOOD PRESSURE 12/25/20  Yes [provider]  metoprolol succinate (TOPROL-XL) 100 MG 24 hr tablet TAKE ONE-HALF TABLET BY MOUTH TWO TIMES A DAY FOR HEART AND BLOOD PRESSURE 12/25/20  Yes [provider]  acetaminophen (TYLENOL) 500 MG tablet Take 2 tablets (1,000 mg total) by mouth every 6 (six)  hours as needed for mild pain. 08/16/15   Ardelle Balls, PA-C  aspirin EC 81 MG EC tablet Take 1 tablet (81 mg total) by mouth daily. 08/16/15   Ardelle Balls, PA-C  atorvastatin (LIPITOR) 80 MG tablet Take 1 tablet (80 mg total) by mouth daily at 6 PM. 08/16/15   Ardelle Balls, PA-C  ferrous sulfate 325 (65 FE) MG EC tablet Take 325 mg by mouth daily with breakfast.    [provider]  mesalamine (APRISO) 0.375 G 24 hr capsule Take 1,500 mg by mouth daily.    [provider]  Multiple Vitamin (MULTIVITAMIN WITH MINERALS) TABS tablet Take 1 tablet by mouth daily.    [provider]    Family History Family History  Problem Relation Age of Onset   Heart failure Father 58    Social History Social History   Tobacco Use   Smoking status: Never  Substance Use Topics   Alcohol use: No   Drug use: No     Allergies   Patient has no known allergies.   Review of Systems Review of Systems  Constitutional:  Negative for appetite change, chills, diaphoresis and fever.  Respiratory:  Negative for shortness of breath.   Cardiovascular:  Negative for chest pain.  Gastrointestinal:  Positive for abdominal pain. Negative for blood in stool, constipation, diarrhea, nausea and vomiting.  Genitourinary:  Positive for dysuria. Negative for decreased urine volume, difficulty urinating, flank pain, frequency, genital sores, hematuria, penile discharge, penile pain, penile swelling, scrotal swelling, testicular pain and urgency.  Musculoskeletal:  Negative for back pain.  Neurological:  Negative for dizziness, weakness and light-headedness.  All other systems reviewed and are negative.   Physical Exam Triage Vital Signs ED Triage Vitals  Enc Vitals Group     BP 07/13/21 1049 (!) 146/66     Pulse Rate 07/13/21 1049 75     Resp 07/13/21 1049 18     Temp 07/13/21 1049 98.5 F (36.9 C)     Temp src --      SpO2 07/13/21 1049 94 %     Weight --       Height --      Head Circumference --      Peak Flow --      Pain Score 07/13/21 1044 10     Pain Loc --      Pain Edu? --      Excl. in GC? --    No data found.  Updated Vital Signs BP (!) 146/66   Pulse 75   Temp 98.5 F (36.9 C)   Resp 18   SpO2 94%   Visual Acuity Right Eye Distance:   Left Eye Distance:   Bilateral Distance:    Right Eye Near:   Left Eye Near:    Bilateral Near:     Physical Exam Vitals reviewed.  Constitutional:      General: He is not in acute distress.    Appearance: Normal appearance. He is not ill-appearing.  HENT:     Head: Normocephalic and atraumatic.  Mouth/Throat:     Mouth: Mucous membranes are moist.     Comments: Moist mucous membranes Eyes:     Extraocular Movements: Extraocular movements intact.     Pupils: Pupils are equal, round, and reactive to light.  Cardiovascular:     Rate and Rhythm: Normal rate and regular rhythm.     Heart sounds: Normal heart sounds.  Pulmonary:     Effort: Pulmonary effort is normal.     Breath sounds: Normal breath sounds. No wheezing, rhonchi or rales.  Abdominal:     General: Bowel sounds are normal. There is no distension.     Palpations: Abdomen is soft. There is no mass.     Tenderness: There is abdominal tenderness in the suprapubic area. There is right CVA tenderness and left CVA tenderness. There is no guarding or rebound.     Comments: Bilateral flank pain without midline tenderness   Skin:    General: Skin is warm.     Capillary Refill: Capillary refill takes less than 2 seconds.     Comments: Good skin turgor  Neurological:     General: No focal deficit present.     Mental Status: He is alert and oriented to person, place, and time.  Psychiatric:        Mood and Affect: Mood normal.        Behavior: Behavior normal.     UC Treatments / Results  Labs (all labs ordered are listed, but only abnormal results are displayed) Labs Reviewed  POCT URINALYSIS DIPSTICK, ED /  UC - Abnormal; Notable for the following components:      Result Value   Hgb urine dipstick LARGE (*)    pH 8.5 (*)    Protein, ur >=300 (*)    Urobilinogen, UA 2.0 (*)    Nitrite POSITIVE (*)    Leukocytes,Ua MODERATE (*)    All other components within normal limits  URINE CULTURE    EKG   Radiology No results found.  Procedures Procedures (including critical care time)  Medications Ordered in UC Medications - No data to display  Initial Impression / Assessment and Plan / UC Course  I have reviewed the triage vital signs and the nursing notes.  Pertinent labs & imaging results that were available during my care of the patient were reviewed by me and considered in my medical decision making (see chart for details).     This patient is a very pleasant 65 y.o. year old male presenting with acute cystitis with hematuria. Suprapubic pressure and bilateral flank pain; afebrile and nontachy. History AKI 2019.   UA with blood, leuk, nitrite; culture sent.   Will manage with ciprofloxacin as below, patient has tolerated this antibiotic well in the past.   ED return precautions discussed. Patient verbalizes understanding and agreement.   Coding Level 4 for acute illness with systemic symptoms (concern for pyelonephritis), and prescription drug management   Final Clinical Impressions(s) / UC Diagnoses   Final diagnoses:  Acute cystitis with hematuria     Discharge Instructions      -Start the ciprofloxacin twice daily x7 days -Drink plenty of fluids (water!) -Head to ED if symptoms worsening (back pain, abd pain, fevers/chills, etc) -Follow-up with PCP if symptoms persist      ED Prescriptions     Medication Sig Dispense Auth. Provider   ciprofloxacin (CIPRO) 250 MG tablet Take 1 tablet (250 mg total) by mouth every 12 (twelve) hours for 7 days. 14 tablet Cheree Ditto,  Lyman Speller, PA-C      PDMP not reviewed this encounter.   Rhys Martini, PA-C 07/13/21 1153

## 2021-07-13 NOTE — ED Triage Notes (Signed)
Pt is present today with urine frequency, urine odor, groin pain, dysuria, testicle pain, and lower back pain. Pt states that sx started two days ago

## 2021-07-13 NOTE — Discharge Instructions (Addendum)
-  Start the ciprofloxacin twice daily x7 days -Drink plenty of fluids (water!) -Head to ED if symptoms worsening (back pain, abd pain, fevers/chills, etc) -Follow-up with PCP if symptoms persist

## 2021-07-15 LAB — URINE CULTURE: Culture: >=100000 — AB

## 2021-07-20 ENCOUNTER — Encounter (HOSPITAL_COMMUNITY): Payer: Self-pay | Admitting: Emergency Medicine

## 2021-07-20 ENCOUNTER — Ambulatory Visit (HOSPITAL_COMMUNITY)
Admission: EM | Admit: 2021-07-20 | Discharge: 2021-07-20 | Disposition: A | Discharge status: Home / Self Care | Payer: No Typology Code available for payment source | Attending: Family Medicine | Admitting: Family Medicine

## 2021-07-20 ENCOUNTER — Other Ambulatory Visit: Payer: Self-pay

## 2021-07-20 DIAGNOSIS — R3911 Hesitancy of micturition: Secondary | ICD-10-CM | POA: Insufficient documentation

## 2021-07-20 DIAGNOSIS — R600 Localized edema: Secondary | ICD-10-CM | POA: Diagnosis present

## 2021-07-20 LAB — POCT URINALYSIS DIPSTICK, ED / UC
Bilirubin Urine: NEGATIVE
Glucose, UA: NEGATIVE mg/dL
Ketones, ur: NEGATIVE mg/dL
Leukocytes,Ua: NEGATIVE
Nitrite: NEGATIVE
Protein, ur: NEGATIVE mg/dL
Specific Gravity, Urine: 1.02 (ref 1.005–1.030)
Urobilinogen, UA: 0.2 mg/dL (ref 0.0–1.0)
pH: 6.5 (ref 5.0–8.0)

## 2021-07-20 NOTE — ED Triage Notes (Signed)
Pt is present today with urinary rentention and slight abdominal pain. Pt states that he did finish taking previous antibiotic for possible UTI

## 2021-07-20 NOTE — Discharge Instructions (Signed)
Swelling happens when fluid collects in small spaces around tissues and organs inside the body. Another word for swelling is "edema." Some common parts of the body where people can have swelling are the lower legs or hands. This typically is worse in the areas of the body that are closest to the ground (because of gravity)  Symptoms of swelling can include puffiness of the skin, which can cause the skin to look stretched and shiny. This often occurs with swelling in the lower legs and can be worse after you sit or stand for a long time.  Treatment of edema includes several components: treatment of the underlying cause (if possible), reducing the amount of salt (sodium) in your diet, and, in many cases, use of a medication called a diuretic to eliminate excess fluid. Using compression stockings and elevating the legs may also be recommended.   You have had labs (blood work and urine culture) sent today. We will call you with any significant abnormalities or if there is need to begin or change treatment or pursue further follow up.  You may also review your test results online through MyChart. If you do not have a MyChart account, instructions to sign up should be on your discharge paperwork.

## 2021-07-21 LAB — URINE CULTURE: Culture: NO GROWTH

## 2021-07-21 NOTE — ED Provider Notes (Signed)
  [facility]    ASSESSMENT & PLAN:  1. Urinary hesitancy   2. Bilateral lower extremity edema    U/A with trace hematuria; otherwise no signs of infection. Declines Foley catheter for comfort. Discussed possible prostate related urinary retention. Recommend urology or PCP f/u. Ordered CMP/CBC but apparently he declined these upon discharge. Urine culture sent.  Outlined signs and symptoms indicating need for more acute intervention. Patient verbalized understanding. After Visit Summary given.  SUBJECTIVE:  Ryan Cole is a 65 y.o. male who complains of urinary frequency and suprapubic discomfort; noted today. Able to urinate but just a trickle. No gross hematuria. Afebrile. Normal ambulation. Recently treated for UTI.  OBJECTIVE:  Vitals:   07/20/21 1342  BP: (!) 187/74  Pulse: 63  Resp: 18  Temp: 98.1 F (36.7 C)  SpO2: 98%   General appearance: alert; no distress Lungs: unlabored respirations Abdomen: soft, non-tender; no masses or organomegaly; no guarding or rebound tenderness Back: no CVA tenderness Extremities: no edema; symmetrical with no gross deformities Skin: warm and dry Neurologic: normal gait Psychological: alert and cooperative; normal mood and affect  Labs Reviewed  POCT URINALYSIS DIPSTICK, ED / UC - Abnormal; Notable for the following components:      Result Value   Hgb urine dipstick TRACE (*)    All other components within normal limits  URINE CULTURE    No Known Allergies  Past Medical History:  Diagnosis Date   CEREBROVASCULAR ACCIDENT, HX OF 06/08/2007   Chronic systolic CHF (congestive heart failure) (HCC) 08/29/2015   DYSPHORIA 10/10/2007   HYPERLIPIDEMIA 06/08/2007   Hypertension    HYPOGONADISM, MALE 10/10/2007   INSOMNIA-SLEEP DISORDER-UNSPEC 10/10/2007   LOW BACK PAIN 06/08/2007   WEAKNESS, LEFT SIDE OF BODY 10/10/2007   Social History   Socioeconomic History   Marital status: Married    Spouse name: Not on file    Number of children: 0   Years of education: Not on file   Highest education level: Not on file  Occupational History   Occupation: truck driver  Tobacco Use   Smoking status: Never   Smokeless tobacco: Not on file  Substance and Sexual Activity   Alcohol use: No   Drug use: No   Sexual activity: Not on file  Other Topics Concern   Not on file  Social History Narrative   Not on file   Social Determinants of Health   Financial Resource Strain: Not on file  Food Insecurity: Not on file  Transportation Needs: Not on file  Physical Activity: Not on file  Stress: Not on file  Social Connections: Not on file  Intimate Partner Violence: Not on file   Family History  Problem Relation Age of Onset   Heart failure Father 20        Mardella Layman, MD 07/21/21 [phone]

## 2022-11-26 ENCOUNTER — Emergency Department (HOSPITAL_COMMUNITY)
Admission: EM | Admit: 2022-11-26 | Discharge: 2022-11-27 | Disposition: A | Discharge status: Home / Self Care | Payer: No Typology Code available for payment source | Attending: Emergency Medicine | Admitting: Emergency Medicine

## 2022-11-26 ENCOUNTER — Encounter (HOSPITAL_COMMUNITY): Payer: Self-pay | Admitting: *Deleted

## 2022-11-26 ENCOUNTER — Emergency Department (HOSPITAL_COMMUNITY): Payer: No Typology Code available for payment source

## 2022-11-26 ENCOUNTER — Other Ambulatory Visit: Payer: Self-pay

## 2022-11-26 DIAGNOSIS — Z79899 Other long term (current) drug therapy: Secondary | ICD-10-CM | POA: Diagnosis not present

## 2022-11-26 DIAGNOSIS — Z7982 Long term (current) use of aspirin: Secondary | ICD-10-CM | POA: Insufficient documentation

## 2022-11-26 DIAGNOSIS — I10 Essential (primary) hypertension: Secondary | ICD-10-CM | POA: Diagnosis present

## 2022-11-26 LAB — CBC
HCT: 37.1 % — ABNORMAL LOW (ref 39.0–52.0)
Hemoglobin: 12.1 g/dL — ABNORMAL LOW (ref 13.0–17.0)
MCH: 30.1 pg (ref 26.0–34.0)
MCHC: 32.6 g/dL (ref 30.0–36.0)
MCV: 92.3 fL (ref 80.0–100.0)
Platelets: 225 10*3/uL (ref 150–400)
RBC: 4.02 MIL/uL — ABNORMAL LOW (ref 4.22–5.81)
RDW: 15.3 % (ref 11.5–15.5)
WBC: 5.5 10*3/uL (ref 4.0–10.5)
nRBC: 0 % (ref 0.0–0.2)

## 2022-11-26 LAB — TROPONIN I (HIGH SENSITIVITY): Troponin I (High Sensitivity): 13 ng/L (ref <18)

## 2022-11-26 LAB — BASIC METABOLIC PANEL
Anion gap: 6 (ref 5–15)
BUN: 13 mg/dL (ref 8–23)
CO2: 32 mmol/L (ref 22–32)
Calcium: 10 mg/dL (ref 8.9–10.3)
Chloride: 100 mmol/L (ref 98–111)
Creatinine, Ser: 1.25 mg/dL — ABNORMAL HIGH (ref 0.61–1.24)
GFR, Estimated: >60 mL/min (ref >60)
Glucose, Bld: 118 mg/dL — ABNORMAL HIGH (ref 70–99)
Potassium: 3.7 mmol/L (ref 3.5–5.1)
Sodium: 138 mmol/L (ref 135–145)

## 2022-11-26 NOTE — ED Triage Notes (Signed)
[facility]   bp was elevated yesterday and his feet and legs swollen

## 2022-11-27 LAB — TROPONIN I (HIGH SENSITIVITY): Troponin I (High Sensitivity): 16 ng/L (ref <18)

## 2022-11-27 MED ORDER — CLONIDINE HCL 0.1 MG PO TABS: 0.1000 mg | ORAL_TABLET | Freq: Once | ORAL | Status: DC

## 2022-11-27 MED ORDER — CLONIDINE HCL 0.1 MG PO TABS
0.1000 mg | ORAL_TABLET | Freq: Once | ORAL | Status: AC
  Administered 2022-11-27: 0.1 mg via ORAL
  Filled 2022-11-27: qty 1

## 2022-11-27 MED ORDER — CLONIDINE HCL 0.1 MG PO TABS: 0.1000 mg | ORAL_TABLET | Freq: Two times a day (BID) | ORAL | 3 refills | Status: AC

## 2022-11-27 NOTE — ED Provider Notes (Signed)
[facility] Provider Note   CSN: [id number] Arrival date & time: 11/26/22  2058     History  Chief Complaint  Patient presents with   Hypertension    Ryan Cole is a 67 y.o. male.  Patient presents to the emergency department for evaluation of elevated blood pressure.  Patient reports that he was seen by his cardiologist at the [facility] on Friday.  Blood pressure was over [id number] systolic at that time.  He reports that the cardiologist was uncomfortable treating this so he was sent to the ER at the [place] [facility].  He had swelling of his legs at that time.  He reports that this happens on and off and he does take Lasix as needed.  They were going to give him IV diuresis but he felt like he could take his Lasix at home so opted to be discharged.  His swelling has gone down, however his blood pressure has stayed elevated.  No associated chest pain, shortness of breath.       Home Medications Prior to Admission medications   Medication Sig Start Date End Date Taking? Authorizing Provider  cloNIDine (CATAPRES) 0.1 MG tablet Take 1 tablet (0.1 mg total) by mouth 2 (two) times daily. 11/27/22  Yes Jaylen Knope, Gwenyth Allegra, MD  acetaminophen (TYLENOL) 500 MG tablet Take 2 tablets (1,000 mg total) by mouth every 6 (six) hours as needed for mild pain. 08/16/15   Nani Skillern, PA-C  aspirin EC 81 MG EC tablet Take 1 tablet (81 mg total) by mouth daily. 08/16/15   Nani Skillern, PA-C  atorvastatin (LIPITOR) 80 MG tablet Take 1 tablet (80 mg total) by mouth daily at 6 PM. 08/16/15   Nani Skillern, PA-C  ferrous sulfate 325 (65 FE) MG EC tablet Take 325 mg by mouth daily with breakfast.    [provider]  furosemide (LASIX) 20 MG tablet TAKE ONE TABLET BY MOUTH EVERY DAY AS NEEDED FOR BLOOD PRESSURE AND FOR FLUID CONTROL USE IF GAINING MORE THAN 2.5 POUNDS IN 1 DAY OR MORE THAN 5 POUNDS WITHIN 5 DAYS. 08/19/20   [provider]  lisinopril (ZESTRIL) 40 MG tablet TAKE ONE-HALF TABLET BY MOUTH EVERY DAY FOR BLOOD PRESSURE 12/25/20   [provider]  mesalamine (APRISO) 0.375 G 24 hr capsule Take 1,500 mg by mouth daily.    [provider]  metoprolol succinate (TOPROL-XL) 100 MG 24 hr tablet TAKE ONE-HALF TABLET BY MOUTH TWO TIMES A DAY FOR HEART AND BLOOD PRESSURE 12/25/20   [provider]  Multiple Vitamin (MULTIVITAMIN WITH MINERALS) TABS tablet Take 1 tablet by mouth daily.    [provider]      Allergies    Patient has no known allergies.    Review of Systems   Review of Systems  Physical Exam Updated Vital Signs BP (!) 134/53   Pulse 65   Temp 97.8 F (36.6 C) (Oral)   Resp 16   Ht 6' (1.829 m)   Wt 89.3 kg   SpO2 98%   BMI 26.70 kg/m  Physical Exam Vitals and nursing note reviewed.  Constitutional:      General: He is not in acute distress.    Appearance: He is well-developed.  HENT:     Head: Normocephalic and atraumatic.     Mouth/Throat:     Mouth: Mucous membranes are moist.  Eyes:     General: Vision grossly intact. Gaze  aligned appropriately.     Extraocular Movements: Extraocular movements intact.     Conjunctiva/sclera: Conjunctivae normal.  Cardiovascular:     Rate and Rhythm: Normal rate and regular rhythm.     Pulses: Normal pulses.     Heart sounds: Normal heart sounds, S1 normal and S2 normal. No murmur heard.    No friction rub. No gallop.  Pulmonary:     Effort: Pulmonary effort is normal. No respiratory distress.     Breath sounds: Normal breath sounds.  Abdominal:     Palpations: Abdomen is soft.     Tenderness: There is no abdominal tenderness. There is no guarding or rebound.     Hernia: No hernia is present.  Musculoskeletal:        General: No swelling.     Cervical back: Full passive range of motion without pain, normal range of motion and neck supple. No pain with movement, spinous process tenderness or  muscular tenderness. Normal range of motion.     Right lower leg: No edema.     Left lower leg: No edema.  Skin:    General: Skin is warm and dry.     Capillary Refill: Capillary refill takes less than 2 seconds.     Findings: No ecchymosis, erythema, lesion or wound.  Neurological:     Mental Status: He is alert and oriented to person, place, and time.     GCS: GCS eye subscore is 4. GCS verbal subscore is 5. GCS motor subscore is 6.     Cranial Nerves: Cranial nerves 2-12 are intact.     Sensory: Sensation is intact.     Motor: Motor function is intact. No weakness or abnormal muscle tone.     Coordination: Coordination is intact.  Psychiatric:        Mood and Affect: Mood normal.        Speech: Speech normal.        Behavior: Behavior normal.     ED Results / Procedures / Treatments   Labs (all labs ordered are listed, but only abnormal results are displayed) Labs Reviewed  BASIC METABOLIC PANEL - Abnormal; Notable for the following components:      Result Value   Glucose, Bld 118 (*)    Creatinine, Ser 1.25 (*)    All other components within normal limits  CBC - Abnormal; Notable for the following components:   RBC 4.02 (*)    Hemoglobin 12.1 (*)    HCT 37.1 (*)    All other components within normal limits  TROPONIN I (HIGH SENSITIVITY)  TROPONIN I (HIGH SENSITIVITY)    EKG EKG Interpretation  Date/Time:  Sunday November 27 2022 02:58:40 EST Ventricular Rate:  63 PR Interval:  175 QRS Duration: 104 QT Interval:  466 QTC Calculation: 478 R Axis:   53 Text Interpretation: Sinus rhythm Probable left atrial enlargement Repol abnrm suggests ischemia, anterolateral Confirmed by Orpah Greek [phone]) on 11/27/2022 3:07:22 AM  Radiology DG Chest 2 View  Result Date: 11/26/2022 CLINICAL DATA:  Hypertension, swollen feet. EXAM: CHEST - 2 VIEW COMPARISON:  Chest x-ray 08/31/2015 FINDINGS: Sternotomy wires are present. The heart size and mediastinal contours are  within normal limits. Both lungs are clear. The visualized skeletal structures are unremarkable. There is stable elevation of the left hemidiaphragm. IMPRESSION: No active cardiopulmonary disease. Electronically Signed   By: Ronney Asters M.D.   On: 11/26/2022 21:51    Procedures Procedures    Medications Ordered in ED Medications  cloNIDine (CATAPRES) tablet 0.1 mg (0.1 mg Oral Not Given 11/27/22 0505)  cloNIDine (CATAPRES) tablet 0.1 mg (0.1 mg Oral Given 11/27/22 0342)    ED Course/ Medical Decision Making/ A&P                             Medical Decision Making Amount and/or Complexity of Data Reviewed Labs: ordered. Decision-making details documented in ED Course. Radiology: ordered and independent interpretation performed. Decision-making details documented in ED Course. ECG/medicine tests: ordered and independent interpretation performed. Decision-making details documented in ED Course.  Risk Prescription drug management.   Patient presents to the emergency department for evaluation of elevated blood pressure.  Patient does have a history of hypertension, previously treated with Toprol and lisinopril.  Lisinopril dose is maxed out.  He was seen at the [facility] yesterday.  It sounds like his cardiologist sent him to the ED, possibly for diuresis and to be admitted.  He did not want to be admitted and therefore opted for diuresis at home.  His leg swelling has resolved.  He reports that in the past whenever his swelling went down, his blood pressure would get better as well.  This did not happen this time.  Patient appears well.  He does have a history of thoracic aortic aneurysm repair in 2016, no chest pain, back pain, shortness of breath.  Mediastinum is normal on x-ray, doubt aortic complications.  Discussed with patient that he likely requires better blood pressure control.  I did not consider amlodipine secondary to its possibility to worsen his leg swelling.  He was given clonidine  and his blood pressure improved.  He will be discharged with a prescription, is to follow-up with his doctors at the [place] to determine if they want to continue clonidine or try another agent for blood pressure control.        Final Clinical Impression(s) / ED Diagnoses Final diagnoses:  Primary hypertension    Rx / DC Orders ED Discharge Orders          Ordered    cloNIDine (CATAPRES) 0.1 MG tablet  2 times daily        11/27/22 0502              Orpah Greek, MD 11/27/22 [phone]

## 2023-11-10 ENCOUNTER — Encounter (HOSPITAL_COMMUNITY): Payer: Self-pay | Admitting: Emergency Medicine

## 2023-11-10 ENCOUNTER — Other Ambulatory Visit: Payer: Self-pay

## 2023-11-10 ENCOUNTER — Inpatient Hospital Stay (HOSPITAL_COMMUNITY)
Admission: EM | Admit: 2023-11-10 | Discharge: 2023-11-13 | DRG: 699 | Disposition: A | Discharge status: Home / Self Care | Payer: No Typology Code available for payment source | Attending: Internal Medicine | Admitting: Internal Medicine

## 2023-11-10 DIAGNOSIS — I5042 Chronic combined systolic (congestive) and diastolic (congestive) heart failure: Secondary | ICD-10-CM | POA: Diagnosis present

## 2023-11-10 DIAGNOSIS — Z8679 Personal history of other diseases of the circulatory system: Secondary | ICD-10-CM

## 2023-11-10 DIAGNOSIS — N9989 Other postprocedural complications and disorders of genitourinary system: Principal | ICD-10-CM | POA: Diagnosis present

## 2023-11-10 DIAGNOSIS — Z7982 Long term (current) use of aspirin: Secondary | ICD-10-CM

## 2023-11-10 DIAGNOSIS — I130 Hypertensive heart and chronic kidney disease with heart failure and stage 1 through stage 4 chronic kidney disease, or unspecified chronic kidney disease: Secondary | ICD-10-CM | POA: Diagnosis present

## 2023-11-10 DIAGNOSIS — E8809 Other disorders of plasma-protein metabolism, not elsewhere classified: Secondary | ICD-10-CM | POA: Diagnosis not present

## 2023-11-10 DIAGNOSIS — N182 Chronic kidney disease, stage 2 (mild): Secondary | ICD-10-CM | POA: Diagnosis present

## 2023-11-10 DIAGNOSIS — K5190 Ulcerative colitis, unspecified, without complications: Secondary | ICD-10-CM | POA: Diagnosis present

## 2023-11-10 DIAGNOSIS — Z8249 Family history of ischemic heart disease and other diseases of the circulatory system: Secondary | ICD-10-CM

## 2023-11-10 DIAGNOSIS — N3289 Other specified disorders of bladder: Secondary | ICD-10-CM | POA: Diagnosis not present

## 2023-11-10 DIAGNOSIS — B952 Enterococcus as the cause of diseases classified elsewhere: Secondary | ICD-10-CM | POA: Diagnosis present

## 2023-11-10 DIAGNOSIS — B965 Pseudomonas (aeruginosa) (mallei) (pseudomallei) as the cause of diseases classified elsewhere: Secondary | ICD-10-CM | POA: Diagnosis present

## 2023-11-10 DIAGNOSIS — Z8673 Personal history of transient ischemic attack (TIA), and cerebral infarction without residual deficits: Secondary | ICD-10-CM

## 2023-11-10 DIAGNOSIS — I4891 Unspecified atrial fibrillation: Secondary | ICD-10-CM | POA: Diagnosis present

## 2023-11-10 DIAGNOSIS — Y838 Other surgical procedures as the cause of abnormal reaction of the patient, or of later complication, without mention of misadventure at the time of the procedure: Secondary | ICD-10-CM | POA: Diagnosis present

## 2023-11-10 DIAGNOSIS — T839XXA Unspecified complication of genitourinary prosthetic device, implant and graft, initial encounter: Secondary | ICD-10-CM

## 2023-11-10 DIAGNOSIS — D62 Acute posthemorrhagic anemia: Secondary | ICD-10-CM | POA: Diagnosis not present

## 2023-11-10 DIAGNOSIS — N3091 Cystitis, unspecified with hematuria: Secondary | ICD-10-CM | POA: Diagnosis present

## 2023-11-10 DIAGNOSIS — R319 Hematuria, unspecified: Principal | ICD-10-CM

## 2023-11-10 DIAGNOSIS — Z79899 Other long term (current) drug therapy: Secondary | ICD-10-CM

## 2023-11-10 DIAGNOSIS — C61 Malignant neoplasm of prostate: Secondary | ICD-10-CM | POA: Diagnosis present

## 2023-11-10 DIAGNOSIS — R310 Gross hematuria: Secondary | ICD-10-CM | POA: Diagnosis not present

## 2023-11-10 DIAGNOSIS — E785 Hyperlipidemia, unspecified: Secondary | ICD-10-CM | POA: Diagnosis present

## 2023-11-10 DIAGNOSIS — Z9079 Acquired absence of other genital organ(s): Secondary | ICD-10-CM

## 2023-11-10 LAB — COMPREHENSIVE METABOLIC PANEL
ALT: 28 U/L (ref 0–44)
AST: 26 U/L (ref 15–41)
Albumin: 3.8 g/dL (ref 3.5–5.0)
Alkaline Phosphatase: 81 U/L (ref 38–126)
Anion gap: 13 (ref 5–15)
BUN: 14 mg/dL (ref 8–23)
CO2: 23 mmol/L (ref 22–32)
Calcium: 9.4 mg/dL (ref 8.9–10.3)
Chloride: 101 mmol/L (ref 98–111)
Creatinine, Ser: 1.33 mg/dL — ABNORMAL HIGH (ref 0.61–1.24)
GFR, Estimated: 59 mL/min — ABNORMAL LOW (ref >60)
Glucose, Bld: 99 mg/dL (ref 70–99)
Potassium: 3.8 mmol/L (ref 3.5–5.1)
Sodium: 137 mmol/L (ref 135–145)
Total Bilirubin: 0.6 mg/dL (ref 0.0–1.2)
Total Protein: 6.5 g/dL (ref 6.5–8.1)

## 2023-11-10 LAB — CBC WITH DIFFERENTIAL/PLATELET
Abs Immature Granulocytes: 0.02 10*3/uL (ref 0.00–0.07)
Basophils Absolute: 0 10*3/uL (ref 0.0–0.1)
Basophils Relative: 0 %
Eosinophils Absolute: 0 10*3/uL (ref 0.0–0.5)
Eosinophils Relative: 1 %
HCT: 30.3 % — ABNORMAL LOW (ref 39.0–52.0)
Hemoglobin: 9.6 g/dL — ABNORMAL LOW (ref 13.0–17.0)
Immature Granulocytes: 0 %
Lymphocytes Relative: 8 %
Lymphs Abs: 0.7 10*3/uL (ref 0.7–4.0)
MCH: 30.4 pg (ref 26.0–34.0)
MCHC: 31.7 g/dL (ref 30.0–36.0)
MCV: 95.9 fL (ref 80.0–100.0)
Monocytes Absolute: 0.8 10*3/uL (ref 0.1–1.0)
Monocytes Relative: 9 %
Neutro Abs: 7.3 10*3/uL (ref 1.7–7.7)
Neutrophils Relative %: 82 %
Platelets: 230 10*3/uL (ref 150–400)
RBC: 3.16 MIL/uL — ABNORMAL LOW (ref 4.22–5.81)
RDW: 14.9 % (ref 11.5–15.5)
WBC: 8.8 10*3/uL (ref 4.0–10.5)
nRBC: 0 % (ref 0.0–0.2)

## 2023-11-10 LAB — URINALYSIS, MICROSCOPIC (REFLEX): RBC / HPF: >50 RBC/hpf (ref 0–5)

## 2023-11-10 MED ORDER — SODIUM CHLORIDE 0.9 % IR SOLN
3000.0000 mL | Status: DC
  Administered 2023-11-10: 3000 mL

## 2023-11-10 NOTE — ED Provider Notes (Addendum)
[place] EMERGENCY DEPARTMENT AT [facility] Provider Note   CSN: [id number] Arrival date & time: 11/10/23  1535     History  Chief Complaint  Patient presents with   Foley Catheter Problem    Ryan Cole is a 68 y.o. male.  68 year old male presents today for concern of Foley catheter issue.  He states he recently had a cystoscopy done on Tuesday.  He states he initially had some bleeding which resolved the morning after but then around 3:30 AM last night he developed some cramping, hematuria.  This persisted throughout the day.  He states while he was in the waiting room he did notice some retention.  He also has been passing some clots.  On January 6 he underwent radical robotic prostatectomy with bladder neck reconstruction. he was started on Macrobid due to bladder not being fully healed when he had the cystoscopy.  The history is provided by the patient. No language interpreter was used.       Home Medications Prior to Admission medications   Medication Sig Start Date End Date Taking? Authorizing Provider  acetaminophen (TYLENOL) 500 MG tablet Take 2 tablets (1,000 mg total) by mouth every 6 (six) hours as needed for mild pain. 08/16/15   Ardelle Balls, PA-C  aspirin EC 81 MG EC tablet Take 1 tablet (81 mg total) by mouth daily. 08/16/15   Ardelle Balls, PA-C  atorvastatin (LIPITOR) 80 MG tablet Take 1 tablet (80 mg total) by mouth daily at 6 PM. 08/16/15   Ardelle Balls, PA-C  cloNIDine (CATAPRES) 0.1 MG tablet Take 1 tablet (0.1 mg total) by mouth 2 (two) times daily. 11/27/22   Gilda Crease, MD  ferrous sulfate 325 (65 FE) MG EC tablet Take 325 mg by mouth daily with breakfast.    [provider]  furosemide (LASIX) 20 MG tablet TAKE ONE TABLET BY MOUTH EVERY DAY AS NEEDED FOR BLOOD PRESSURE AND FOR FLUID CONTROL USE IF GAINING MORE THAN 2.5 POUNDS IN 1 DAY OR MORE THAN 5 POUNDS WITHIN 5 DAYS. 08/19/20   [provider]  lisinopril (ZESTRIL) 40 MG tablet TAKE ONE-HALF TABLET BY MOUTH EVERY DAY FOR BLOOD PRESSURE 12/25/20   [provider]  mesalamine (APRISO) 0.375 G 24 hr capsule Take 1,500 mg by mouth daily.    [provider]  metoprolol succinate (TOPROL-XL) 100 MG 24 hr tablet TAKE ONE-HALF TABLET BY MOUTH TWO TIMES A DAY FOR HEART AND BLOOD PRESSURE 12/25/20   [provider]  Multiple Vitamin (MULTIVITAMIN WITH MINERALS) TABS tablet Take 1 tablet by mouth daily.    [provider]      Allergies    Patient has no known allergies.    Review of Systems   Review of Systems  Constitutional:  Negative for fever.  Genitourinary:  Positive for dysuria. Negative for frequency and urgency.  All other systems reviewed and are negative.   Physical Exam Updated Vital Signs BP 125/74   Pulse 85   Temp 98.6 F (37 C) (Oral)   Resp 20   SpO2 99%  Physical Exam Vitals and nursing note reviewed.  Constitutional:      General: He is not in acute distress.    Appearance: Normal appearance. He is not ill-appearing.  HENT:     Head: Normocephalic and atraumatic.     Nose: Nose normal.  Eyes:     Conjunctiva/sclera: Conjunctivae normal.  Cardiovascular:     Rate and  Rhythm: Normal rate.  Pulmonary:     Effort: Pulmonary effort is normal. No respiratory distress.  Genitourinary:    Comments: Catheter in place.  He has a tissue around the Foley catheter which has blood noted on it.  Uncircumcised. Musculoskeletal:        General: No deformity.  Skin:    Findings: No rash.  Neurological:     Mental Status: He is alert.     ED Results / Procedures / Treatments   Labs (all labs ordered are listed, but only abnormal results are displayed) Labs Reviewed  CBC WITH DIFFERENTIAL/PLATELET - Abnormal; Notable for the following components:      Result Value   RBC 3.16 (*)    Hemoglobin 9.6 (*)    HCT 30.3 (*)    All other components within normal limits   COMPREHENSIVE METABOLIC PANEL - Abnormal; Notable for the following components:   Creatinine, Ser 1.33 (*)    GFR, Estimated 59 (*)    All other components within normal limits  URINALYSIS, ROUTINE W REFLEX MICROSCOPIC - Abnormal; Notable for the following components:   Color, Urine RED (*)    APPearance TURBID (*)    Glucose, UA   (*)    Value: TEST NOT REPORTED DUE TO COLOR INTERFERENCE OF URINE PIGMENT   Hgb urine dipstick   (*)    Value: TEST NOT REPORTED DUE TO COLOR INTERFERENCE OF URINE PIGMENT   Bilirubin Urine   (*)    Value: TEST NOT REPORTED DUE TO COLOR INTERFERENCE OF URINE PIGMENT   Ketones, ur   (*)    Value: TEST NOT REPORTED DUE TO COLOR INTERFERENCE OF URINE PIGMENT   Protein, ur   (*)    Value: TEST NOT REPORTED DUE TO COLOR INTERFERENCE OF URINE PIGMENT   Nitrite   (*)    Value: TEST NOT REPORTED DUE TO COLOR INTERFERENCE OF URINE PIGMENT   Leukocytes,Ua   (*)    Value: TEST NOT REPORTED DUE TO COLOR INTERFERENCE OF URINE PIGMENT   All other components within normal limits  URINALYSIS, MICROSCOPIC (REFLEX) - Abnormal; Notable for the following components:   Bacteria, UA MANY (*)    All other components within normal limits    EKG None  Radiology No results found.  Procedures BLADDER CATHETERIZATION  Date/Time: 11/10/2023 11:47 PM  Performed by: Marita Kansas, PA-C Authorized by: Marita Kansas, PA-C   Consent:    Consent obtained:  Verbal   Consent given by:  Patient   Risks, benefits, and alternatives were discussed: yes     Risks discussed:  Incomplete procedure, infection, urethral injury and pain   Alternatives discussed:  No treatment and delayed treatment Universal protocol:    Procedure explained and questions answered to patient or proxy's satisfaction: yes     Relevant documents present and verified: yes     Test results available: yes     Patient identity confirmed:  Verbally with patient and arm band Pre-procedure details:    Procedure  purpose:  Diagnostic Procedure details:    Provider performed due to:  Nurse unable to complete and complicated insertion   Catheter insertion:  Indwelling   Catheter size:  18 Fr   Bladder irrigation: yes     Number of attempts:  1   Urine characteristics:  Bloody Post-procedure details:    Procedure completion:  Tolerated well, no immediate complications     Medications Ordered in ED Medications  sodium chloride irrigation 0.9 % 3,000  mL (3,000 mLs Irrigation New Bag/Given 11/10/23 2329)    ED Course/ Medical Decision Making/ A&P                                 Medical Decision Making Amount and/or Complexity of Data Reviewed Labs: ordered.  Risk Prescription drug management.   Medical Decision Making / ED Course   This patient presents to the ED for concern of Foley catheter problem, this involves an extensive number of treatment options, and is a complaint that carries with it a high risk of complications and morbidity.  The differential diagnosis includes obstruction, urinary tract infection, dislodged, irritation from the recent cystoscopy  MDM: 68 year old male presents today for concern of hematuria along with cramping.  Started around 3:30 AM on 1/24.  Foley bag with gross hematuria.  Admission considered but will reevaluate after blood work, and imaging.  Recent history of radical prostatectomy with bladder neck reconstruction on 1/6.  This was done at [facility].  Discussed with Dr. Ronne Binning of urology.  Recommends replacing his current cath with another 18 [place] three-way catheter and doing continuous irrigation.  He will evaluate patient.  He states if there is difficulty placing the catheter to let him know so he can come evaluate patient sooner.  Three-way Foley catheter was placed by me.  Advised nursing to start irrigation.  CBC does show some anemia with a hemoglobin of 9.6.  His recent from 11 months ago was 12.1 however in the  past he has had hemoglobins in the eights.  Currently denies any lightheadedness.  Is without tachycardia.  No significant concern for acute blood loss anemia.  CMP does show creatinine 1.33 otherwise without acute concern.  At the end of my shift he is awaiting urology consult and reevaluation.  Signout to oncoming provider.    Additional history obtained: -Additional history obtained from [place] note -External records from outside source obtained and reviewed including: Chart review including previous notes, labs, imaging, consultation notes   Lab Tests: -I ordered, reviewed, and interpreted labs.   The pertinent results include:   Labs Reviewed  CBC WITH DIFFERENTIAL/PLATELET - Abnormal; Notable for the following components:      Result Value   RBC 3.16 (*)    Hemoglobin 9.6 (*)    HCT 30.3 (*)    All other components within normal limits  COMPREHENSIVE METABOLIC PANEL - Abnormal; Notable for the following components:   Creatinine, Ser 1.33 (*)    GFR, Estimated 59 (*)    All other components within normal limits  URINALYSIS, ROUTINE W REFLEX MICROSCOPIC - Abnormal; Notable for the following components:   Color, Urine RED (*)    APPearance TURBID (*)    Glucose, UA   (*)    Value: TEST NOT REPORTED DUE TO COLOR INTERFERENCE OF URINE PIGMENT   Hgb urine dipstick   (*)    Value: TEST NOT REPORTED DUE TO COLOR INTERFERENCE OF URINE PIGMENT   Bilirubin Urine   (*)    Value: TEST NOT REPORTED DUE TO COLOR INTERFERENCE OF URINE PIGMENT   Ketones, ur   (*)    Value: TEST NOT REPORTED DUE TO COLOR INTERFERENCE OF URINE PIGMENT   Protein, ur   (*)    Value: TEST NOT REPORTED DUE TO COLOR INTERFERENCE OF URINE PIGMENT   Nitrite   (*)    Value: TEST NOT REPORTED DUE  TO COLOR INTERFERENCE OF URINE PIGMENT   Leukocytes,Ua   (*)    Value: TEST NOT REPORTED DUE TO COLOR INTERFERENCE OF URINE PIGMENT   All other components within normal limits  URINALYSIS, MICROSCOPIC (REFLEX) - Abnormal;  Notable for the following components:   Bacteria, UA MANY (*)    All other components within normal limits  URINALYSIS, ROUTINE W REFLEX MICROSCOPIC      EKG  EKG Interpretation Date/Time:    Ventricular Rate:    PR Interval:    QRS Duration:    QT Interval:    QTC Calculation:   R Axis:      Text Interpretation:          Medicines ordered and prescription drug management: Meds ordered this encounter  Medications   sodium chloride irrigation 0.9 % 3,000 mL    -I have reviewed the patients home medicines and have made adjustments as needed  Consultations Obtained: I requested consultation with the urology ,  and discussed lab and imaging findings as well as pertinent plan - they recommend: as above   Reevaluation: After the interventions noted above, I reevaluated the patient and found that they have :improved  Co morbidities that complicate the patient evaluation  Past Medical History:  Diagnosis Date   CEREBROVASCULAR ACCIDENT, 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  Final Clinical Impression(s) / ED Diagnoses Final diagnoses:  Hematuria, unspecified type  Problem with Foley catheter, initial encounter [facility])    Rx / DC Orders ED Discharge Orders     None         Marita Kansas, PA-C 11/10/23 2337    Marita Kansas, PA-C 11/10/23 2349    Jacalyn Lefevre, MD 11/10/23 2352

## 2023-11-10 NOTE — ED Provider Triage Note (Signed)
Emergency Medicine Provider Triage Evaluation Note  Ryan Cole , a 68 y.o. male  was evaluated in triage.  Pt complains of having blood come out of his catheter.  States he recently underwent prostate removal for prostate cancer on 10/23/2023 with the [place].  Was not having any problems immediately after the surgery, but recently developed the blood in the urine and some lower abdominal pain.  His doctor told him to come here.  Denies any fever or chills.  Denies any new back pain.  Review of Systems  Positive: As above Negative: As above  Physical Exam  BP (!) 180/62   Pulse 98   Temp 98.2 F (36.8 C)   Resp 20   SpO2 100%  Gen:   Awake, no distress   Resp:  Normal effort  MSK:   Moves extremities without difficulty  Other:  Blood in catheter bag  Medical Decision Making  Medically screening exam initiated at 3:46 PM.  Appropriate orders placed.  Ryan Cole was informed that the remainder of the evaluation will be completed by another provider, this initial triage assessment does not replace that evaluation, and the importance of remaining in the ED until their evaluation is complete.     Arabella Merles, PA-C 11/10/23 1546

## 2023-11-10 NOTE — ED Triage Notes (Signed)
Pt reports having foley catheter placed on Jan 6th. Pt states at this time he is having hematuria with clots in his foley bag. Pt also reports leaking around the catheter and rectal pain.

## 2023-11-10 NOTE — ED Provider Notes (Signed)
11:47 PM Care assumed at shift change from [place], [place]. Patient with hematuria, currently undergoing CBI through 18-Fr 3-way catheter. Pending bedside Urology consultation. Hemodynamically stable. Defer to Urology rec's.  1:55 AM Continues to pend Urology consultation in the ED.   2:30 AM Epic secure chat message sent to Dr. Ronne Binning to inquire about timeline for consultation. Patient continues to be on CBI, but at slower rate as hematuria clearing.  4:26 AM Dr. Ronne Binning of Urology paged to inquire about consultation timeline.  5:11 AM Dr. Ronne Binning anticipates being by to see patient in approximately 1 hours; however, does advise medical admission for monitoring of ongoing hematuria. Consult for unassigned medical admission placed.  5:19 AM Case discussed with Dr. Margo Aye of [facility] who will assess patient for admission.   Antony Madura, PA-C 11/11/23 1610    Nira Conn, MD 11/13/23 [phone]

## 2023-11-11 ENCOUNTER — Inpatient Hospital Stay (HOSPITAL_COMMUNITY): Payer: No Typology Code available for payment source

## 2023-11-11 DIAGNOSIS — D62 Acute posthemorrhagic anemia: Secondary | ICD-10-CM | POA: Diagnosis not present

## 2023-11-11 DIAGNOSIS — Y838 Other surgical procedures as the cause of abnormal reaction of the patient, or of later complication, without mention of misadventure at the time of the procedure: Secondary | ICD-10-CM | POA: Diagnosis present

## 2023-11-11 DIAGNOSIS — Z8679 Personal history of other diseases of the circulatory system: Secondary | ICD-10-CM | POA: Diagnosis not present

## 2023-11-11 DIAGNOSIS — Z9079 Acquired absence of other genital organ(s): Secondary | ICD-10-CM | POA: Diagnosis not present

## 2023-11-11 DIAGNOSIS — I4891 Unspecified atrial fibrillation: Secondary | ICD-10-CM | POA: Diagnosis present

## 2023-11-11 DIAGNOSIS — N3289 Other specified disorders of bladder: Secondary | ICD-10-CM | POA: Diagnosis not present

## 2023-11-11 DIAGNOSIS — E785 Hyperlipidemia, unspecified: Secondary | ICD-10-CM | POA: Diagnosis present

## 2023-11-11 DIAGNOSIS — R310 Gross hematuria: Secondary | ICD-10-CM | POA: Diagnosis present

## 2023-11-11 DIAGNOSIS — I5042 Chronic combined systolic (congestive) and diastolic (congestive) heart failure: Secondary | ICD-10-CM | POA: Diagnosis present

## 2023-11-11 DIAGNOSIS — N9989 Other postprocedural complications and disorders of genitourinary system: Secondary | ICD-10-CM | POA: Diagnosis present

## 2023-11-11 DIAGNOSIS — B965 Pseudomonas (aeruginosa) (mallei) (pseudomallei) as the cause of diseases classified elsewhere: Secondary | ICD-10-CM | POA: Diagnosis present

## 2023-11-11 DIAGNOSIS — K5190 Ulcerative colitis, unspecified, without complications: Secondary | ICD-10-CM | POA: Diagnosis present

## 2023-11-11 DIAGNOSIS — I130 Hypertensive heart and chronic kidney disease with heart failure and stage 1 through stage 4 chronic kidney disease, or unspecified chronic kidney disease: Secondary | ICD-10-CM | POA: Diagnosis present

## 2023-11-11 DIAGNOSIS — Z79899 Other long term (current) drug therapy: Secondary | ICD-10-CM | POA: Diagnosis not present

## 2023-11-11 DIAGNOSIS — Z8673 Personal history of transient ischemic attack (TIA), and cerebral infarction without residual deficits: Secondary | ICD-10-CM | POA: Diagnosis not present

## 2023-11-11 DIAGNOSIS — N182 Chronic kidney disease, stage 2 (mild): Secondary | ICD-10-CM | POA: Diagnosis present

## 2023-11-11 DIAGNOSIS — B952 Enterococcus as the cause of diseases classified elsewhere: Secondary | ICD-10-CM | POA: Diagnosis present

## 2023-11-11 DIAGNOSIS — D649 Anemia, unspecified: Secondary | ICD-10-CM | POA: Diagnosis not present

## 2023-11-11 DIAGNOSIS — N3091 Cystitis, unspecified with hematuria: Secondary | ICD-10-CM | POA: Diagnosis present

## 2023-11-11 DIAGNOSIS — E8809 Other disorders of plasma-protein metabolism, not elsewhere classified: Secondary | ICD-10-CM | POA: Diagnosis not present

## 2023-11-11 DIAGNOSIS — Z8249 Family history of ischemic heart disease and other diseases of the circulatory system: Secondary | ICD-10-CM | POA: Diagnosis not present

## 2023-11-11 DIAGNOSIS — Z7982 Long term (current) use of aspirin: Secondary | ICD-10-CM | POA: Diagnosis not present

## 2023-11-11 DIAGNOSIS — C61 Malignant neoplasm of prostate: Secondary | ICD-10-CM | POA: Diagnosis present

## 2023-11-11 LAB — TYPE AND SCREEN
ABO/RH(D): O POS
Antibody Screen: NEGATIVE

## 2023-11-11 LAB — HEMOGLOBIN AND HEMATOCRIT, BLOOD
HCT: 29.4 % — ABNORMAL LOW (ref 39.0–52.0)
HCT: 28.4 % — ABNORMAL LOW (ref 39.0–52.0)
HCT: 28 % — ABNORMAL LOW (ref 39.0–52.0)
Hemoglobin: 9.4 g/dL — ABNORMAL LOW (ref 13.0–17.0)
Hemoglobin: 9 g/dL — ABNORMAL LOW (ref 13.0–17.0)
Hemoglobin: 9.3 g/dL — ABNORMAL LOW (ref 13.0–17.0)

## 2023-11-11 LAB — URINALYSIS, ROUTINE W REFLEX MICROSCOPIC
Bacteria, UA: NONE SEEN
Bilirubin Urine: NEGATIVE
Glucose, UA: NEGATIVE mg/dL
Ketones, ur: NEGATIVE mg/dL
Nitrite: NEGATIVE
Protein, ur: 30 mg/dL — AB
RBC / HPF: >50 RBC/hpf (ref 0–5)
Specific Gravity, Urine: 1.004 — ABNORMAL LOW (ref 1.005–1.030)
WBC, UA: >50 WBC/hpf (ref 0–5)
pH: 7 (ref 5.0–8.0)

## 2023-11-11 LAB — HIV ANTIBODY (ROUTINE TESTING W REFLEX): HIV Screen 4th Generation wRfx: NONREACTIVE

## 2023-11-11 MED ORDER — POLYETHYLENE GLYCOL 3350 17 G PO PACK: 17.0000 g | PACK | Freq: Every day | ORAL | Status: DC | PRN

## 2023-11-11 MED ORDER — IOHEXOL 300 MG/ML  SOLN
50.0000 mL | Freq: Once | INTRAMUSCULAR | Status: AC | PRN
  Administered 2023-11-11: 50 mL

## 2023-11-11 MED ORDER — HYDROMORPHONE HCL 1 MG/ML IJ SOLN
0.5000 mg | INTRAMUSCULAR | Status: DC | PRN
  Administered 2023-11-12: 0.5 mg via INTRAVENOUS
  Filled 2023-11-11: qty 0.5

## 2023-11-11 MED ORDER — ATORVASTATIN CALCIUM 80 MG PO TABS
80.0000 mg | ORAL_TABLET | Freq: Every day | ORAL | Status: DC
Start: 2023-11-11 — End: 2023-11-13
  Administered 2023-11-11 – 2023-11-13 (×3): 80 mg via ORAL
  Filled 2023-11-11 (×3): qty 1

## 2023-11-11 MED ORDER — FUROSEMIDE 20 MG PO TABS
20.0000 mg | ORAL_TABLET | Freq: Every day | ORAL | Status: DC
  Administered 2023-11-11 – 2023-11-13 (×3): 20 mg via ORAL
  Filled 2023-11-11 (×3): qty 1

## 2023-11-11 MED ORDER — TAMSULOSIN HCL 0.4 MG PO CAPS
0.4000 mg | ORAL_CAPSULE | Freq: Every day | ORAL | Status: DC
  Administered 2023-11-11 – 2023-11-12 (×2): 0.4 mg via ORAL
  Filled 2023-11-11 (×2): qty 1

## 2023-11-11 MED ORDER — MESALAMINE 1.2 G PO TBEC
1.2000 g | DELAYED_RELEASE_TABLET | Freq: Four times a day (QID) | ORAL | Status: DC
  Administered 2023-11-11 – 2023-11-13 (×11): 1.2 g via ORAL
  Filled 2023-11-11 (×12): qty 1

## 2023-11-11 MED ORDER — OXYCODONE-ACETAMINOPHEN 5-325 MG PO TABS
1.0000 | ORAL_TABLET | Freq: Four times a day (QID) | ORAL | Status: DC | PRN
  Administered 2023-11-12 – 2023-11-13 (×3): 1 via ORAL
  Filled 2023-11-11 (×3): qty 1

## 2023-11-11 MED ORDER — METOPROLOL SUCCINATE ER 100 MG PO TB24
100.0000 mg | ORAL_TABLET | Freq: Every day | ORAL | Status: DC
  Administered 2023-11-11 – 2023-11-13 (×3): 100 mg via ORAL
  Filled 2023-11-11: qty 1
  Filled 2023-11-11: qty 4
  Filled 2023-11-11: qty 1

## 2023-11-11 MED ORDER — PIPERACILLIN-TAZOBACTAM 3.375 G IVPB
3.3750 g | Freq: Three times a day (TID) | INTRAVENOUS | Status: DC
  Administered 2023-11-11 – 2023-11-13 (×7): 3.375 g via INTRAVENOUS
  Filled 2023-11-11 (×11): qty 50

## 2023-11-11 MED ORDER — MESALAMINE 1000 MG RE SUPP
1000.0000 mg | Freq: Every day | RECTAL | Status: DC
  Administered 2023-11-11 – 2023-11-12 (×2): 1000 mg via RECTAL
  Filled 2023-11-11 (×3): qty 1

## 2023-11-11 MED ORDER — LISINOPRIL 20 MG PO TABS
40.0000 mg | ORAL_TABLET | Freq: Every day | ORAL | Status: DC
  Administered 2023-11-11 – 2023-11-12 (×2): 40 mg via ORAL
  Filled 2023-11-11 (×2): qty 2

## 2023-11-11 MED ORDER — PIPERACILLIN-TAZOBACTAM 3.375 G IVPB 30 MIN
3.3750 g | Freq: Once | INTRAVENOUS | Status: AC
  Administered 2023-11-11: 3.375 g via INTRAVENOUS
  Filled 2023-11-11: qty 50

## 2023-11-11 NOTE — Progress Notes (Signed)
Pharmacy Antibiotic Note  Ryan Cole is a 68 y.o. male admitted on 11/10/2023 presenting s/p cystoscopy with foley issues, concern for infection.  Pharmacy has been consulted for Zosyn dosing.  Plan: Zosyn 3.375g IV q 8h (extended 4h infusion) Monitor renal function, UCx results and urology recs   Height: 6' (182.9 cm) Weight: 89.3 kg (196 lb 13.9 oz) IBW/kg (Calculated) : 77.6  Temp (24hrs), Avg:99 F (37.2 C), Min:98.2 F (36.8 C), Max:100.8 F (38.2 C)  Recent Labs  Lab 11/10/23 1545  WBC 8.8  CREATININE 1.33*    Estimated Creatinine Clearance: 60 mL/min (A) (by C-G formula based on SCr of 1.33 mg/dL (H)).    No Known Allergies  Daylene Posey, PharmD, [facility] Clinical Pharmacist ED Pharmacist Phone # [phone] 11/11/2023 10:09 AM

## 2023-11-11 NOTE — H&P (Signed)
History and Physical    Patient: Ryan Cole HCW:[id number] DOB: [birth year] DOA: 11/10/2023 DOS: the patient was seen and examined on 11/11/2023 PCP: [facility]  Patient coming from: Home  Chief Complaint:  Chief Complaint  Patient presents with   Foley Catheter Problem   HPI: Ryan Cole is a 68 y.o. male with medical history significant of stage III prostate cancer status post radical robotic prostatectomy with bladder neck reconstruction (10/23/2023), hypertension, hyperlipidemia, ulcerative colitis, history of thoracic aortic dissection status post repair (2016), chronic HFpEF presenting to the ED with gross hematuria.  Patient states that he was diagnosed with stage III prostate cancer and underwent radical robotic prostatectomy with bladder neck reconstruction on January 6 at [facility].  [facility].  He states that he had a cystoscopy done this past Tuesday and was noted to have bladder leakage at that time.  He was advised to take Macrobid due to his bladder not being fully healed at the time of the cystoscopy and also advised to keep his Foley catheter in place for another 10 to 14 days and follow-up again in early February.  He reports that he has noted ongoing hematuria through his Foley catheter over the past several days, at times with heavy bleeding and at times with clots in his Foley catheter.  States that Thursday, his urine became more clear but then Thursday night he developed some cramping along with gross hematuria again.  His hematuria persisted throughout early Friday and this prompted him to come in for further evaluation.  He denies any fevers, chills, chest pain, shortness of breath, nausea, vomiting, abdominal pain.  He does note some burning with urination.  He does note that he has been having significant pain around his rectum/anus after recent surgery.  He has been taking as needed oxycodone 5 mg but states that there have been  several times where he is in significant pain despite taking his oxycodone.  He does note a history of ulcerative colitis for which she is on mesalamine both suppository and oral.  He also reports a history of thoracic aortic dissection in 2016 status post repair.  ED course: Initial vitals with elevated blood pressure to 180/62.  While in the ED blood pressure has ranged from 140s-170s/50s-60s.  He also had 1 episode of low-grade fever to 100.8 and subsequently defervesced without intervention.  CBC with hemoglobin 9.6 (stable compared to hemoglobin level on January 8 in [place] note from 11/07/2023).  CMP with creatinine 1.33 (baseline appears to be around 1.2).  UA with significant hematuria and microscopy showing no WBCs but many bacteria.  H&H this a.m. with hemoglobin 9.4.  Urology consulted by ED provider.  [facility] hospitalist asked to evaluate patient for admission.  Review of Systems: As mentioned in the history of present illness. All other systems reviewed and are negative. Past Medical History:  Diagnosis Date   CEREBROVASCULAR ACCIDENT, HX OF 06/08/2007   Chronic systolic CHF (congestive heart failure) (HCC) 08/29/2015   DYSPHORIA 10/10/2007   HYPERLIPIDEMIA 06/08/2007   Hypertension    HYPOGONADISM, MALE 10/10/2007   INSOMNIA-SLEEP DISORDER-UNSPEC 10/10/2007   LOW BACK PAIN 06/08/2007   WEAKNESS, LEFT SIDE OF BODY 10/10/2007   Past Surgical History:  Procedure Laterality Date   ABDOMINAL SURGERY  2014   removal of benign colon mass    achilles     CARDIAC CATHETERIZATION N/A 08/11/2015   Procedure: Left Heart Cath and Coronary Angiography;  Surgeon: Corky Crafts, MD;  Location: [facility] INVASIVE CV LAB;  Service: Cardiovascular;  Laterality: N/A;   CARDIOVERSION N/A 08/14/2015   Procedure: CARDIOVERSION;  Surgeon: Lewayne Bunting, MD;  Location: [facility] ENDOSCOPY;  Service: Cardiovascular;  Laterality: N/A;   TEE WITHOUT CARDIOVERSION N/A 08/14/2015   Procedure: TRANSESOPHAGEAL  ECHOCARDIOGRAM (TEE);  Surgeon: Lewayne Bunting, MD;  Location: [facility] ENDOSCOPY;  Service: Cardiovascular;  Laterality: N/A;   THORACIC AORTIC ANEURYSM REPAIR N/A 08/02/2015   Procedure: THORACIC ASCENDING ANEURYSM REPAIR (AAA);  Surgeon: Loreli Slot, MD;  Location: [facility] OR;  Service: Open Heart Surgery;  Laterality: N/A;   Social History:  reports that he has never smoked. He does not have any smokeless tobacco history on file. He reports that he does not drink alcohol and does not use drugs.  No Known Allergies  Family History  Problem Relation Age of Onset   Heart failure Father 9    Prior to Admission medications   Medication Sig Start Date End Date Taking? Authorizing Provider  acetaminophen (TYLENOL) 500 MG tablet Take 2 tablets (1,000 mg total) by mouth every 6 (six) hours as needed for mild pain. 08/16/15  Yes Doree Fudge M, PA-C  aspirin EC 81 MG EC tablet Take 1 tablet (81 mg total) by mouth daily. 08/16/15  Yes Doree Fudge M, PA-C  atorvastatin (LIPITOR) 80 MG tablet Take 1 tablet (80 mg total) by mouth daily at 6 PM. 08/16/15  Yes Doree Fudge M, PA-C  cloNIDine (CATAPRES) 0.1 MG tablet Take 1 tablet (0.1 mg total) by mouth 2 (two) times daily. Patient taking differently: Take 0.1 mg by mouth as needed (Elevated blood pressure). Take when blood pressure is >180 11/27/22  Yes Pollina, Canary Brim, MD  diclofenac (VOLTAREN) 50 MG EC tablet Take 50 mg by mouth as needed for moderate pain (pain score 4-6) or mild pain (pain score 1-3). 10/25/23  Yes [provider]  ferrous sulfate 325 (65 FE) MG EC tablet Take 325 mg by mouth daily with breakfast.   Yes [provider]  furosemide (LASIX) 20 MG tablet Take 20 mg by mouth daily. 08/19/20  Yes [provider]  lisinopril (ZESTRIL) 40 MG tablet Take 40 mg by mouth at bedtime. 12/25/20  Yes [provider]  mesalamine (CANASA) 1000 MG suppository Place 1,000 mg rectally at  bedtime.   Yes [provider]  mesalamine (LIALDA) 1.2 g EC tablet Take 1.2 g by mouth 4 (four) times daily. Take with meals   Yes [provider]  metoprolol succinate (TOPROL-XL) 100 MG 24 hr tablet TAKE ONE-HALF TABLET BY MOUTH TWO TIMES A DAY FOR HEART AND BLOOD PRESSURE 12/25/20  Yes [provider]  Multiple Vitamin (MULTIVITAMIN WITH MINERALS) TABS tablet Take 1 tablet by mouth daily.   Yes [provider]  nitrofurantoin, macrocrystal-monohydrate, (MACROBID) 100 MG capsule Take 100 mg by mouth at bedtime. 11/07/23  Yes [provider]  oxybutynin (DITROPAN) 5 MG tablet Take 5 mg by mouth every 8 (eight) hours as needed. 10/30/23  Yes [provider]  oxyCODONE (OXY IR/ROXICODONE) 5 MG immediate release tablet Take 5 mg by mouth every 4 (four) hours as needed for moderate pain (pain score 4-6) or severe pain (pain score 7-10). 10/25/23  Yes [provider]  senna-docusate (SENOKOT-S) 8.6-50 MG tablet Take 2 tablets by mouth 2 (two) times daily. 11/07/23  Yes [provider]  tamsulosin (FLOMAX) 0.4 MG CAPS capsule Take 0.4 mg by mouth at bedtime.  02/27/23  Yes [provider]    Physical Exam: Vitals:   11/11/23 0630 11/11/23 0700 11/11/23 0715 11/11/23 0731  BP: (!) 141/61 (!) 141/62 (!) 140/63   Pulse: 73 76 74   Resp: 20 (!) 25 (!) 23   Temp:      TempSrc:      SpO2: 100% 100% 100%   Weight:    89.3 kg  Height:    6' (1.829 m)   Physical Exam Constitutional:      Appearance: Normal appearance. He is not ill-appearing.  HENT:     Head: Normocephalic and atraumatic.     Mouth/Throat:     Mouth: Mucous membranes are moist.     Pharynx: Oropharynx is clear. No oropharyngeal exudate.  Eyes:     General: No scleral icterus.    Extraocular Movements: Extraocular movements intact.     Conjunctiva/sclera: Conjunctivae normal.     Pupils: Pupils are equal, round, and reactive to light.  Cardiovascular:      Rate and Rhythm: Normal rate and regular rhythm.     Pulses: Normal pulses.     Heart sounds: Normal heart sounds. No murmur heard.    No friction rub. No gallop.  Pulmonary:     Effort: Pulmonary effort is normal.     Breath sounds: Normal breath sounds. No wheezing, rhonchi or rales.  Abdominal:     General: Bowel sounds are normal. There is no distension.     Palpations: Abdomen is soft.     Tenderness: There is no abdominal tenderness. There is no guarding or rebound.  Musculoskeletal:        General: Normal range of motion.     Cervical back: Normal range of motion.     Comments: 1+ pitting edema in bilateral lower extremities up to knees.  Skin:    General: Skin is warm and dry.  Neurological:     General: No focal deficit present.     Mental Status: He is alert and oriented to person, place, and time.  Psychiatric:        Mood and Affect: Mood normal.        Behavior: Behavior normal.     Data Reviewed:  There are no new results to review at this time.     Latest Ref Rng & Units 11/11/2023    5:41 AM 11/10/2023    3:45 PM 11/26/2022    9:23 PM  CBC  WBC 4.0 - 10.5 K/uL  8.8  5.5   Hemoglobin 13.0 - 17.0 g/dL 9.4  9.6  95.6   Hematocrit 39.0 - 52.0 % 29.4  30.3  37.1   Platelets 150 - 400 K/uL  230  225       Latest Ref Rng & Units 11/10/2023    3:45 PM 11/26/2022    9:23 PM 09/01/2015    5:30 AM  CMP  Glucose 70 - 99 mg/dL 99  213  97   BUN 8 - 23 mg/dL 14  13  14    Creatinine 0.61 - 1.24 mg/dL 0.86  5.78  4.69   Sodium 135 - 145 mmol/L 137  138  139   Potassium 3.5 - 5.1 mmol/L 3.8  3.7  3.7   Chloride 98 - 111 mmol/L 101  100  107   CO2 22 - 32 mmol/L 23  32  25   Calcium 8.9 - 10.3 mg/dL 9.4  62.9  8.8   Total Protein 6.5 - 8.1 g/dL  6.5     Total Bilirubin 0.0 - 1.2 mg/dL 0.6     Alkaline Phos 38 - 126 U/L 81     AST 15 - 41 U/L 26     ALT 0 - 44 U/L 28      Urinalysis    Component Value Date/Time   COLORURINE YELLOW 11/11/2023 0147   APPEARANCEUR  HAZY (A) 11/11/2023 0147   LABSPEC 1.004 (L) 11/11/2023 0147   PHURINE 7.0 11/11/2023 0147   GLUCOSEU NEGATIVE 11/11/2023 0147   HGBUR LARGE (A) 11/11/2023 0147   BILIRUBINUR NEGATIVE 11/11/2023 0147   KETONESUR NEGATIVE 11/11/2023 0147   PROTEINUR 30 (A) 11/11/2023 0147   UROBILINOGEN 0.2 07/20/2021 1403   NITRITE NEGATIVE 11/11/2023 0147   LEUKOCYTESUR LARGE (A) 11/11/2023 0147      Assessment and Plan: No notes have been filed under this hospital service. Service: Hospitalist   Gross Hematuria Hx of stage 3 prostate cancer s/p radical robotic prostatectomy with bladder neck reconstruction (10/23/2023) Patient underwent radical robotic prostatectomy with bladder [facility] at [facility]  [facility].  He is now presenting with gross hematuria.  Recent cystoscopy this past Tuesday revealed that his bladder was not yet fully healed and showed signs of leakage.  He was advised to start Macrobid and keep Foley catheter in place until next follow-up visit in early February.  Urology has been consulted.  He was started on slow continuous bladder irrigation and urine appears to be clearing.  Per urology recommendations, obtaining CT abdomen pelvis to assess if patient is having worsening bladder injury/leak.  If CT imaging showing signs of intraperitoneal bladder leak, patient may need to be transferred to [facility] for further care.  His hemoglobin trend appears to be stable. -urology following, appreciate assistance -continuous bladder irrigation -Follow-up CT cystogram, if signs of intraperitoneal bladder leak present may need to transfer patient to [facility] -Trend H&H every 8 hours, transfuse if hemoglobin less than 7 -SCDs for DVT prophylaxis -Admit to inpatient/progressive -Pain control with oxycodone 5-325 mg every 6 hours as needed for moderate and severe pain, IV Dilaudid 0.5 mg every 3 hours as needed for breakthrough pain -Continue home Flomax  Fever Patient  did have 1 episode of low-grade fever to 100.8 F and subsequently defervesced without intervention.  Discussed with urology, who recommended starting patient on appropriate coverage for Enterococcus and Pseudomonas.  Likely source of fever is urinary. - Zosyn -Follow-up blood cultures x 2 -Trend fever curve  Thoracic Aortic dissection s/p repair (2016) Atrial fibrillation Patient with history of type I aortic dissection with resuspension of his aortic valve.  He underwent replacement of his arch with grafting individually to innominate and left common carotid arteries.  Postop course was complicated by atrial fibrillation and flutter which required DC cardioversion.  Patient is not on anticoagulation for atrial fibrillation.  [facility] -Continue home Lipitor 80 mg daily -Continue home Toprol-XL 100 mg daily -Telemetry  HTN Patient takes lisinopril 40 mg nightly and Toprol-XL 100 mg daily at home.  He also has clonidine 0.1 mg which he takes as needed for SBP greater than 180.  Blood pressure elevated to the 140s-170s/50s-60s while in the ED.  Will resume home lisinopril and Toprol-XL. -Continue home lisinopril and Toprol-XL -Holding home as needed clonidine  Chronic HFpEF TEE in 2016 showing LVEF 45-50% and hypokinesis of anteroseptal myocardium. TEE was obtained during time of aortic dissection. He does have pitting edema in bilateral LE and  reports taking furosemide 20mg  daily at home. Unclear if he has had a recent echocardiogram so will obtain one here for further evaluation. He denies any orthopnea, PND.  [facility]. -f/u ECHO -continue lasix 20mg  daily  CKD stage II Per chart review, patient appears to be around CKD stage II/IIIa.  Baseline creatinine appears to be around 1.2.  On admission, creatinine 1.33. -Trend kidney function -Avoid nephrotoxic agents as able -Monitor urine output  Ulcerative colitis Does not appear to be in an active flare.  He  is taking oral mesalamine 1.2 g 4 times daily and mesalamine suppository 1000 mg nightly. -Continue home oral and rectal mesalamine regimen   Advance Care Planning:   Code Status: Full Code   Consults: urology, Dr. Ronne Binning  Family Communication: updated spouse at bedside  Severity of Illness: The appropriate patient status for this patient is INPATIENT. Inpatient status is judged to be reasonable and necessary in order to provide the required intensity of service to ensure the patient's safety. The patient's presenting symptoms, physical exam findings, and initial radiographic and laboratory data in the context of their chronic comorbidities is felt to place them at high risk for further clinical deterioration. Furthermore, it is not anticipated that the patient will be medically stable for discharge from the hospital within 2 midnights of admission.   * I certify that at the point of admission it is my clinical judgment that the patient will require inpatient hospital care spanning beyond 2 midnights from the point of admission due to high intensity of service, high risk for further deterioration and high frequency of surveillance required.*  Portions of this note were generated with Dragon dictation software. Dictation errors may occur despite best attempts at proofreading.   Author: Briscoe Burns, MD 11/11/2023 7:56 AM  For on call review [email or web address].

## 2023-11-11 NOTE — ED Notes (Signed)
 irrigated through pt's bladder.

## 2023-11-11 NOTE — Consult Note (Signed)
Urology Consult  Referring physician: Dr. Austin Miles Reason for referral: gross hematuria  Chief Complaint: gross hematuria  History of Present Illness: Mr Ryan Cole is a 68yo with a history prostate cancer who presented to the ER with gross hematuria and clot retention. He underwent radical prostatectomy on 10/23/23 at the [place] in [place]. He required bladder neck reconstruction. He has a persistent anastomotic leak on cystogram from 11/07/2023. He underwent cystoscopy on 11/07/23 and shortly after the cystoscopy he developed gross hematuria. His hematuria worsened yesterday and his foley stopped draining. He presented to the ER and his foley was exchanged for a 3 way foley and irrigation was started. Currently his urine is light pink. CT cystogram shows extraperitoneal bladder leak.   Past Medical History:  Diagnosis Date   CEREBROVASCULAR ACCIDENT, HX OF 06/08/2007   Chronic systolic CHF (congestive heart failure) (HCC) 08/29/2015   DYSPHORIA 10/10/2007   HYPERLIPIDEMIA 06/08/2007   Hypertension    HYPOGONADISM, MALE 10/10/2007   INSOMNIA-SLEEP DISORDER-UNSPEC 10/10/2007   LOW BACK PAIN 06/08/2007   WEAKNESS, LEFT SIDE OF BODY 10/10/2007   Past Surgical History:  Procedure Laterality Date   ABDOMINAL SURGERY  2014   removal of benign colon mass    achilles     CARDIAC CATHETERIZATION N/A 08/11/2015   Procedure: Left Heart Cath and Coronary Angiography;  Surgeon: Corky Crafts, MD;  Location: [facility] INVASIVE CV LAB;  Service: Cardiovascular;  Laterality: N/A;   CARDIOVERSION N/A 08/14/2015   Procedure: CARDIOVERSION;  Surgeon: Lewayne Bunting, MD;  Location: [facility] ENDOSCOPY;  Service: Cardiovascular;  Laterality: N/A;   TEE WITHOUT CARDIOVERSION N/A 08/14/2015   Procedure: TRANSESOPHAGEAL ECHOCARDIOGRAM (TEE);  Surgeon: Lewayne Bunting, MD;  Location: [facility] ENDOSCOPY;  Service: Cardiovascular;  Laterality: N/A;   THORACIC AORTIC ANEURYSM REPAIR N/A 08/02/2015   Procedure: THORACIC ASCENDING ANEURYSM  REPAIR (AAA);  Surgeon: Loreli Slot, MD;  Location: [facility] OR;  Service: Open Heart Surgery;  Laterality: N/A;    Medications: I have reviewed the patient's current medications. Allergies: No Known Allergies  Family History  Problem Relation Age of Onset   Heart failure Father 38   Social History:  reports that he has never smoked. He does not have any smokeless tobacco history on file. He reports that he does not drink alcohol and does not use drugs.  Review of Systems  Genitourinary:  Positive for hematuria.  All other systems reviewed and are negative.   Physical Exam:  Vital signs in last 24 hours: Temp:  [98.2 F (36.8 C)-100.8 F (38.2 C)] 98.2 F (36.8 C) (01/25 1703) Pulse Rate:  [58-85] 59 (01/25 1600) Resp:  [14-25] 19 (01/25 1600) BP: (125-172)/(54-74) 140/56 (01/25 1600) SpO2:  [96 %-100 %] 100 % (01/25 1600) Weight:  [89.3 kg] 89.3 kg (01/25 0731) Physical Exam Vitals reviewed.  Constitutional:      Appearance: Normal appearance.  HENT:     Head: Normocephalic and atraumatic.     Mouth/Throat:     Mouth: Mucous membranes are moist.  Eyes:     Extraocular Movements: Extraocular movements intact.     Pupils: Pupils are equal, round, and reactive to light.  Cardiovascular:     Rate and Rhythm: Normal rate and regular rhythm.  Pulmonary:     Effort: Pulmonary effort is normal. No respiratory distress.  Abdominal:     General: Abdomen is flat. There is no distension.  Musculoskeletal:        General: No swelling. Normal range of motion.  Cervical back: Normal range of motion and neck supple.  Skin:    General: Skin is warm and dry.  Neurological:     General: No focal deficit present.     Mental Status: He is alert and oriented to person, place, and time.  Psychiatric:        Mood and Affect: Mood normal.        Behavior: Behavior normal.        Thought Content: Thought content normal.        Judgment: Judgment normal.     Laboratory Data:   Results for orders placed or performed during the hospital encounter of 11/10/23 (from the past 72 hours)  CBC with Differential     Status: Abnormal   Collection Time: 11/10/23  3:45 PM  Result Value Ref Range   WBC 8.8 4.0 - 10.5 K/uL   RBC 3.16 (L) 4.22 - 5.81 MIL/uL   Hemoglobin 9.6 (L) 13.0 - 17.0 g/dL   HCT 16.1 (L) 09.6 - 04.5 %   MCV 95.9 80.0 - 100.0 fL   MCH 30.4 26.0 - 34.0 pg   MCHC 31.7 30.0 - 36.0 g/dL   RDW 40.9 81.1 - 91.4 %   Platelets 230 150 - 400 K/uL   nRBC 0.0 0.0 - 0.2 %   Neutrophils Relative % 82 %   Neutro Abs 7.3 1.7 - 7.7 K/uL   Lymphocytes Relative 8 %   Lymphs Abs 0.7 0.7 - 4.0 K/uL   Monocytes Relative 9 %   Monocytes Absolute 0.8 0.1 - 1.0 K/uL   Eosinophils Relative 1 %   Eosinophils Absolute 0.0 0.0 - 0.5 K/uL   Basophils Relative 0 %   Basophils Absolute 0.0 0.0 - 0.1 K/uL   Immature Granulocytes 0 %   Abs Immature Granulocytes 0.02 0.00 - 0.07 K/uL    Comment: Performed at [facility] Lab, [address] [place], [place] [address]  Comprehensive metabolic panel     Status: Abnormal   Collection Time: 11/10/23  3:45 PM  Result Value Ref Range   Sodium 137 135 - 145 mmol/L   Potassium 3.8 3.5 - 5.1 mmol/L   Chloride 101 98 - 111 mmol/L   CO2 23 22 - 32 mmol/L   Glucose, Bld 99 70 - 99 mg/dL    Comment: Glucose reference range applies only to samples taken after fasting for at least 8 hours.   BUN 14 8 - 23 mg/dL   Creatinine, Ser 6.21 (H) 0.61 - 1.24 mg/dL   Calcium 9.4 8.9 - 30.8 mg/dL   Total Protein 6.5 6.5 - 8.1 g/dL   Albumin 3.8 3.5 - 5.0 g/dL   AST 26 15 - 41 U/L   ALT 28 0 - 44 U/L   Alkaline Phosphatase 81 38 - 126 U/L   Total Bilirubin 0.6 0.0 - 1.2 mg/dL   GFR, Estimated 59 (L) >60 mL/min    Comment: (NOTE) Calculated using the CKD-EPI Creatinine Equation (2021)    Anion gap 13 5 - 15    Comment: Performed at [facility] Lab, [address], [place], [place] [address]  Urinalysis, Routine w reflex microscopic  -Urine, Catheterized     Status: Abnormal   Collection Time: 11/10/23  8:02 PM  Result Value Ref Range   Color, Urine RED (A) YELLOW    Comment: BIOCHEMICALS MAY BE AFFECTED BY COLOR   APPearance TURBID (A) CLEAR   Specific Gravity, Urine  1.005 - 1.030  TEST NOT REPORTED DUE TO COLOR INTERFERENCE OF URINE PIGMENT   pH  5.0 - 8.0    TEST NOT REPORTED DUE TO COLOR INTERFERENCE OF URINE PIGMENT   Glucose, UA (A) NEGATIVE mg/dL    TEST NOT REPORTED DUE TO COLOR INTERFERENCE OF URINE PIGMENT   Hgb urine dipstick (A) NEGATIVE    TEST NOT REPORTED DUE TO COLOR INTERFERENCE OF URINE PIGMENT   Bilirubin Urine (A) NEGATIVE    TEST NOT REPORTED DUE TO COLOR INTERFERENCE OF URINE PIGMENT   Ketones, ur (A) NEGATIVE mg/dL    TEST NOT REPORTED DUE TO COLOR INTERFERENCE OF URINE PIGMENT   Protein, ur (A) NEGATIVE mg/dL    TEST NOT REPORTED DUE TO COLOR INTERFERENCE OF URINE PIGMENT   Nitrite (A) NEGATIVE    TEST NOT REPORTED DUE TO COLOR INTERFERENCE OF URINE PIGMENT   Leukocytes,Ua (A) NEGATIVE    TEST NOT REPORTED DUE TO COLOR INTERFERENCE OF URINE PIGMENT   RBC / HPF >50 0 - 5 RBC/hpf   WBC, UA 0-5 0 - 5 WBC/hpf   Bacteria, UA MANY (A) NONE SEEN   Squamous Epithelial / HPF 0-5 0 - 5 /HPF    Comment: Performed at [facility] Lab, [address] [place], [place] [address]  Urinalysis, Microscopic (reflex)     Status: Abnormal   Collection Time: 11/10/23  8:02 PM  Result Value Ref Range   RBC / HPF >50 0 - 5 RBC/hpf   WBC, UA 0-5 0 - 5 WBC/hpf   Bacteria, UA MANY (A) NONE SEEN   Squamous Epithelial / HPF 0-5 0 - 5 /HPF    Comment: Performed at [facility] Lab, [address], [place], [place] [address]  Urinalysis, Routine w reflex microscopic -Urine, Catheterized     Status: Abnormal   Collection Time: 11/11/23  1:47 AM  Result Value Ref Range   Color, Urine YELLOW YELLOW   APPearance HAZY (A) CLEAR   Specific Gravity, Urine 1.004 (L) 1.005 - 1.030   pH 7.0 5.0 - 8.0   Glucose,  UA NEGATIVE NEGATIVE mg/dL   Hgb urine dipstick LARGE (A) NEGATIVE   Bilirubin Urine NEGATIVE NEGATIVE   Ketones, ur NEGATIVE NEGATIVE mg/dL   Protein, ur 30 (A) NEGATIVE mg/dL   Nitrite NEGATIVE NEGATIVE   Leukocytes,Ua LARGE (A) NEGATIVE   RBC / HPF 6-10 0 - 5 RBC/hpf   WBC, UA >50 0 - 5 WBC/hpf   Bacteria, UA NONE SEEN NONE SEEN   Squamous Epithelial / HPF 0-5 0 - 5 /HPF   WBC Clumps PRESENT    Mucus PRESENT     Comment: Performed at [facility] Lab, [address] [place], [place] [address]  Hemoglobin and hematocrit, blood     Status: Abnormal   Collection Time: 11/11/23  5:41 AM  Result Value Ref Range   Hemoglobin 9.4 (L) 13.0 - 17.0 g/dL   HCT 21.3 (L) 08.6 - 57.8 %    Comment: Performed at [facility] Lab, [address], [place], [place] [address]  Type and screen [facility]     Status: None   Collection Time: 11/11/23  6:53 AM  Result Value Ref Range   ABO/RH(D) O POS    Antibody Screen NEG    Sample Expiration      11/14/2023,2359 Performed at [facility] Lab, [address] [place], [place] [address]   HIV Antibody (routine testing w rflx)  Status: None   Collection Time: 11/11/23  8:36 AM  Result Value Ref Range   HIV Screen 4th Generation wRfx Non Reactive Non Reactive    Comment: Performed at [facility] Lab, [address] [place], [place] [address]  Hemoglobin and hematocrit, blood     Status: Abnormal   Collection Time: 11/11/23 11:21 AM  Result Value Ref Range   Hemoglobin 9.0 (L) 13.0 - 17.0 g/dL   HCT 13.0 (L) 86.5 - 78.4 %    Comment: Performed at [facility] Lab, [address] [place], [place] [address]   No results found for this or any previous visit (from the past 240 hours). Creatinine: Recent Labs    11/10/23 1545  CREATININE 1.33*   Baseline Creatinine: 1.3  Impression/Assessment:  67yo with gross hematuria requiring CBI  Plan:  I discussed the natural history of gross hematuria and the  various causes. His hematuria is likely a combination of bacterial hemorrhagic cystitis and friable bladder neck anastamosis. Please continue broad spectrum antibiotics pending urine culture. Please continue to wean CBI.  Wilkie Aye 11/11/2023, 5:53 PM

## 2023-11-11 NOTE — ED Notes (Signed)
Patient transported to CT

## 2023-11-11 NOTE — ED Notes (Signed)
Patient took home supply of Lisinopril 40mg . Verified with PA Humes.

## 2023-11-11 NOTE — ED Notes (Signed)
ED TO INPATIENT HANDOFF REPORT  ED Nurse Name and Phone #: Percival Spanish [phone]  S Name/Age/Gender Ryan Cole 68 y.o. male Room/Bed: 031C/031C  Code Status   Code Status: Full Code  Home/SNF/Other Home Patient oriented to: self, place, time, and situation Is this baseline? Yes   Triage Complete: Triage complete  Chief Complaint Gross hematuria [R31.0]  Triage Note Pt reports having foley catheter placed on Jan 6th. Pt states at this time he is having hematuria with clots in his foley bag. Pt also reports leaking around the catheter and rectal pain.    Allergies No Known Allergies  Level of Care/Admitting Diagnosis ED Disposition     ED Disposition  Admit   Condition  --   Comment  Hospital Area: [facility] [[id number]]  Level of Care: Progressive [102]  Admit to Progressive based on following criteria: COMPLICATED UROLOGY Patients requiring frequent assessments and interventions, such as continuous bladder irrigations, immediate post-op surgical procedures, i.e., bladder removal/ileal conduit.  May admit patient to [facility] or [facility] if equivalent level of care is available:: Yes  Covid Evaluation: Asymptomatic - no recent exposure (last 10 days) testing not required  Diagnosis: Gross hematuria [599.71.ICD-9-CM]  Admitting Physician: Darlin Drop [[id number]]  Attending Physician: Darlin Drop [[id number]]  Certification:: I certify this patient will need inpatient services for at least 2 midnights  Expected Medical Readiness: 11/13/2023          B Medical/Surgery History Past Medical History:  Diagnosis Date   CEREBROVASCULAR ACCIDENT, HX OF 06/08/2007   Chronic systolic CHF (congestive heart failure) (HCC) 08/29/2015   DYSPHORIA 10/10/2007   HYPERLIPIDEMIA 06/08/2007   Hypertension    HYPOGONADISM, MALE 10/10/2007   INSOMNIA-SLEEP DISORDER-UNSPEC 10/10/2007   LOW BACK PAIN 06/08/2007   WEAKNESS, LEFT SIDE OF BODY 10/10/2007   Past  Surgical History:  Procedure Laterality Date   ABDOMINAL SURGERY  2014   removal of benign colon mass    achilles     CARDIAC CATHETERIZATION N/A 08/11/2015   Procedure: Left Heart Cath and Coronary Angiography;  Surgeon: Corky Crafts, MD;  Location: [facility] INVASIVE CV LAB;  Service: Cardiovascular;  Laterality: N/A;   CARDIOVERSION N/A 08/14/2015   Procedure: CARDIOVERSION;  Surgeon: Lewayne Bunting, MD;  Location: [facility] ENDOSCOPY;  Service: Cardiovascular;  Laterality: N/A;   TEE WITHOUT CARDIOVERSION N/A 08/14/2015   Procedure: TRANSESOPHAGEAL ECHOCARDIOGRAM (TEE);  Surgeon: Lewayne Bunting, MD;  Location: [facility] ENDOSCOPY;  Service: Cardiovascular;  Laterality: N/A;   THORACIC AORTIC ANEURYSM REPAIR N/A 08/02/2015   Procedure: THORACIC ASCENDING ANEURYSM REPAIR (AAA);  Surgeon: Loreli Slot, MD;  Location: [facility] OR;  Service: Open Heart Surgery;  Laterality: N/A;     A IV Location/Drains/Wounds Patient Lines/Drains/Airways Status     Active Line/Drains/Airways     Name Placement date Placement time Site Days   Peripheral IV 11/11/23 20 G Anterior;Right Forearm 11/11/23  0636  Forearm  less than 1   Urethral Catheter Marita [place], PA Triple-lumen 18 Fr. 11/11/23  0000  Triple-lumen  less than 1            Intake/Output Last 24 hours  Intake/Output Summary (Last 24 hours) at 11/11/2023 1505 Last data filed at 11/11/2023 1435 Gross per 24 hour  Intake 50 ml  Output 7700 ml  Net -7650 ml    Labs/Imaging Results for orders placed or performed during the hospital encounter of 11/10/23 (from the past 48 hours)  CBC with Differential  Status: Abnormal   Collection Time: 11/10/23  3:45 PM  Result Value Ref Range   WBC 8.8 4.0 - 10.5 K/uL   RBC 3.16 (L) 4.22 - 5.81 MIL/uL   Hemoglobin 9.6 (L) 13.0 - 17.0 g/dL   HCT 24.4 (L) 01.0 - 27.2 %   MCV 95.9 80.0 - 100.0 fL   MCH 30.4 26.0 - 34.0 pg   MCHC 31.7 30.0 - 36.0 g/dL   RDW 53.6 64.4 - 03.4 %   Platelets 230 150 - 400  K/uL   nRBC 0.0 0.0 - 0.2 %   Neutrophils Relative % 82 %   Neutro Abs 7.3 1.7 - 7.7 K/uL   Lymphocytes Relative 8 %   Lymphs Abs 0.7 0.7 - 4.0 K/uL   Monocytes Relative 9 %   Monocytes Absolute 0.8 0.1 - 1.0 K/uL   Eosinophils Relative 1 %   Eosinophils Absolute 0.0 0.0 - 0.5 K/uL   Basophils Relative 0 %   Basophils Absolute 0.0 0.0 - 0.1 K/uL   Immature Granulocytes 0 %   Abs Immature Granulocytes 0.02 0.00 - 0.07 K/uL    Comment: Performed at [facility] Lab, [address] [place], [place] [address]  Comprehensive metabolic panel     Status: Abnormal   Collection Time: 11/10/23  3:45 PM  Result Value Ref Range   Sodium 137 135 - 145 mmol/L   Potassium 3.8 3.5 - 5.1 mmol/L   Chloride 101 98 - 111 mmol/L   CO2 23 22 - 32 mmol/L   Glucose, Bld 99 70 - 99 mg/dL    Comment: Glucose reference range applies only to samples taken after fasting for at least 8 hours.   BUN 14 8 - 23 mg/dL   Creatinine, Ser 5.63 (H) 0.61 - 1.24 mg/dL   Calcium 9.4 8.9 - 87.5 mg/dL   Total Protein 6.5 6.5 - 8.1 g/dL   Albumin 3.8 3.5 - 5.0 g/dL   AST 26 15 - 41 U/L   ALT 28 0 - 44 U/L   Alkaline Phosphatase 81 38 - 126 U/L   Total Bilirubin 0.6 0.0 - 1.2 mg/dL   GFR, Estimated 59 (L) >60 mL/min    Comment: (NOTE) Calculated using the CKD-EPI Creatinine Equation (2021)    Anion gap 13 5 - 15    Comment: Performed at [facility] Lab, [address] [place], [place] [address]  Urinalysis, Routine w reflex microscopic -Urine, Catheterized     Status: Abnormal   Collection Time: 11/10/23  8:02 PM  Result Value Ref Range   Color, Urine RED (A) YELLOW    Comment: BIOCHEMICALS MAY BE AFFECTED BY COLOR   APPearance TURBID (A) CLEAR   Specific Gravity, Urine  1.005 - 1.030    TEST NOT REPORTED DUE TO COLOR INTERFERENCE OF URINE PIGMENT   pH  5.0 - 8.0    TEST NOT REPORTED DUE TO COLOR INTERFERENCE OF URINE PIGMENT   Glucose, UA (A) NEGATIVE mg/dL    TEST NOT REPORTED DUE TO COLOR INTERFERENCE  OF URINE PIGMENT   Hgb urine dipstick (A) NEGATIVE    TEST NOT REPORTED DUE TO COLOR INTERFERENCE OF URINE PIGMENT   Bilirubin Urine (A) NEGATIVE    TEST NOT REPORTED DUE TO COLOR INTERFERENCE OF URINE PIGMENT   Ketones, ur (A) NEGATIVE mg/dL    TEST NOT REPORTED DUE TO COLOR INTERFERENCE OF URINE PIGMENT   Protein, ur (A) NEGATIVE mg/dL    TEST NOT REPORTED DUE  TO COLOR INTERFERENCE OF URINE PIGMENT   Nitrite (A) NEGATIVE    TEST NOT REPORTED DUE TO COLOR INTERFERENCE OF URINE PIGMENT   Leukocytes,Ua (A) NEGATIVE    TEST NOT REPORTED DUE TO COLOR INTERFERENCE OF URINE PIGMENT   RBC / HPF >50 0 - 5 RBC/hpf   WBC, UA 0-5 0 - 5 WBC/hpf   Bacteria, UA MANY (A) NONE SEEN   Squamous Epithelial / HPF 0-5 0 - 5 /HPF    Comment: Performed at [facility] Lab, [address], [place], [place] [address]  Urinalysis, Microscopic (reflex)     Status: Abnormal   Collection Time: 11/10/23  8:02 PM  Result Value Ref Range   RBC / HPF >50 0 - 5 RBC/hpf   WBC, UA 0-5 0 - 5 WBC/hpf   Bacteria, UA MANY (A) NONE SEEN   Squamous Epithelial / HPF 0-5 0 - 5 /HPF    Comment: Performed at [facility] Lab, [address], [place], [place] [address]  Urinalysis, Routine w reflex microscopic -Urine, Catheterized     Status: Abnormal   Collection Time: 11/11/23  1:47 AM  Result Value Ref Range   Color, Urine YELLOW YELLOW   APPearance HAZY (A) CLEAR   Specific Gravity, Urine 1.004 (L) 1.005 - 1.030   pH 7.0 5.0 - 8.0   Glucose, UA NEGATIVE NEGATIVE mg/dL   Hgb urine dipstick LARGE (A) NEGATIVE   Bilirubin Urine NEGATIVE NEGATIVE   Ketones, ur NEGATIVE NEGATIVE mg/dL   Protein, ur 30 (A) NEGATIVE mg/dL   Nitrite NEGATIVE NEGATIVE   Leukocytes,Ua LARGE (A) NEGATIVE   RBC / HPF 6-10 0 - 5 RBC/hpf   WBC, UA >50 0 - 5 WBC/hpf   Bacteria, UA NONE SEEN NONE SEEN   Squamous Epithelial / HPF 0-5 0 - 5 /HPF   WBC Clumps PRESENT    Mucus PRESENT     Comment: Performed at [facility] Lab, [address] [place], [place] [address]  Hemoglobin and hematocrit, blood     Status: Abnormal   Collection Time: 11/11/23  5:41 AM  Result Value Ref Range   Hemoglobin 9.4 (L) 13.0 - 17.0 g/dL   HCT 91.4 (L) 78.2 - 95.6 %    Comment: Performed at [facility] Lab, [address] [place], [place] [address]  Type and screen [facility]     Status: None   Collection Time: 11/11/23  6:53 AM  Result Value Ref Range   ABO/RH(D) O POS    Antibody Screen NEG    Sample Expiration      11/14/2023,2359 Performed at [facility] Lab, [address] [place], [place] [address]   HIV Antibody (routine testing w rflx)     Status: None   Collection Time: 11/11/23  8:36 AM  Result Value Ref Range   HIV Screen 4th Generation wRfx Non Reactive Non Reactive    Comment: Performed at [facility] Lab, [address], [place], [place] [address]  Hemoglobin and hematocrit, blood     Status: Abnormal   Collection Time: 11/11/23 11:21 AM  Result Value Ref Range   Hemoglobin 9.0 (L) 13.0 - 17.0 g/dL   HCT 52.8 (L) 41.3 - 24.4 %    Comment: Performed at [facility] Lab, [address], [place], [place] [address]   CT CYSTOGRAM PELVIS Result Date: 11/11/2023 CLINICAL DATA:  Bladder neck obstruction, blood leaking from penis, hematuria status post  prostate and bladder reconstruction EXAM: CT CYSTOGRAM (CT PELVIS WITH CONTRAST) TECHNIQUE: Multidetector CT imaging through the pelvis was performed after dilute contrast had been introduced into the bladder for the purposes of performing CT cystography. RADIATION DOSE REDUCTION: This exam was performed according to the departmental dose-optimization program which includes automated exposure control, adjustment of the mA and/or kV according to patient size and/or use of iterative reconstruction technique. CONTRAST:  50mL OMNIPAQUE IOHEXOL 300 MG/ML  SOLN COMPARISON:  None Available. FINDINGS: Urinary Tract: Foley catheter in the bladder with opacification  of the urinary bladder lumen. Thickened urinary bladder wall with multiple small diverticula and trabeculation, in keeping with chronic outlet obstruction. Extraperitoneal contrast extravasation about the neck of the bladder, prostate and seminal vesicles (series 10, image 94, series 17, image 104). Bowel:  Unremarkable visualized pelvic bowel loops. Vascular/Lymphatic: Multiple small prominent perirectal lymph nodes (series 10, image 62). No significant vascular abnormality seen. Reproductive:  No mass or other significant abnormality Other:  None. Musculoskeletal: No suspicious bone lesions identified. IMPRESSION: 1. Extraperitoneal contrast extravasation about the neck of the bladder, prostate and seminal vesicles consistent with bladder leak of unknown etiology, possibly traumatic Foley insertion. 2. Thickened urinary bladder wall with multiple small diverticula and trabeculation, in keeping with chronic outlet obstruction. 3. Multiple small prominent perirectal lymph nodes, nonspecific and possibly reactive. Electronically Signed   By: Jearld Lesch M.D.   On: 11/11/2023 12:10    Pending Labs Unresulted Labs (From admission, onward)     Start     Ordered   11/12/23 0500  Comprehensive metabolic panel  Tomorrow morning,   R        11/11/23 0745   11/12/23 0500  CBC  Tomorrow morning,   R        11/11/23 0745   11/11/23 1921  Hemoglobin and hematocrit, blood  Now then every 8 hours,   R (with TIMED occurrences)      11/11/23 1018   11/11/23 0804  Culture, blood (Routine X 2) w Reflex to ID Panel  BLOOD CULTURE X 2,   R (with TIMED occurrences)      11/11/23 0803            Vitals/Pain Today's Vitals   11/11/23 1255 11/11/23 1300 11/11/23 1400 11/11/23 1500  BP:  (!) 168/69 (!) 147/56 (!) 147/54  Pulse:  67 (!) 58 (!) 59  Resp:  15 17 14   Temp: 98.3 F (36.8 C)     TempSrc: Oral     SpO2:  100% 100% 100%  Weight:      Height:      PainSc:        Isolation Precautions No active  isolations  Medications Medications  polyethylene glycol (MIRALAX / GLYCOLAX) packet 17 g (has no administration in time range)  atorvastatin (LIPITOR) tablet 80 mg (has no administration in time range)  lisinopril (ZESTRIL) tablet 40 mg (has no administration in time range)  mesalamine (CANASA) suppository 1,000 mg (has no administration in time range)  mesalamine (LIALDA) EC tablet 1.2 g (1.2 g Oral Given 11/11/23 1342)  metoprolol succinate (TOPROL-XL) 24 hr tablet 100 mg (100 mg Oral Given 11/11/23 0935)  tamsulosin (FLOMAX) capsule 0.4 mg (has no administration in time range)  furosemide (LASIX) tablet 20 mg (20 mg Oral Given 11/11/23 0934)  oxyCODONE-acetaminophen (PERCOCET/ROXICET) 5-325 MG per tablet 1 tablet (has no administration in time range)  HYDROmorphone (DILAUDID) injection 0.5 mg (has no administration in time range)  piperacillin-tazobactam (ZOSYN)  IVPB 3.375 g (has no administration in time range)  piperacillin-tazobactam (ZOSYN) IVPB 3.375 g (0 g Intravenous Stopped 11/11/23 1022)  iohexol (OMNIPAQUE) 300 MG/ML solution 50 mL (50 mLs Per Tube Contrast Given 11/11/23 1156)    Mobility walks     Focused Assessments GU   R Recommendations: See Admitting Provider Note  Report given to:   Additional Notes:

## 2023-11-11 NOTE — ED Notes (Signed)
 of normal saline irrigated through pt's bladder.

## 2023-11-12 ENCOUNTER — Inpatient Hospital Stay (HOSPITAL_COMMUNITY): Payer: No Typology Code available for payment source

## 2023-11-12 DIAGNOSIS — R319 Hematuria, unspecified: Secondary | ICD-10-CM

## 2023-11-12 DIAGNOSIS — R310 Gross hematuria: Secondary | ICD-10-CM | POA: Diagnosis not present

## 2023-11-12 LAB — CBC WITH DIFFERENTIAL/PLATELET
Abs Immature Granulocytes: 0.03 10*3/uL (ref 0.00–0.07)
Basophils Absolute: 0 10*3/uL (ref 0.0–0.1)
Basophils Relative: 0 %
Eosinophils Absolute: 0.1 10*3/uL (ref 0.0–0.5)
Eosinophils Relative: 1 %
HCT: 28 % — ABNORMAL LOW (ref 39.0–52.0)
Hemoglobin: 9 g/dL — ABNORMAL LOW (ref 13.0–17.0)
Immature Granulocytes: 0 %
Lymphocytes Relative: 15 %
Lymphs Abs: 1.2 10*3/uL (ref 0.7–4.0)
MCH: 30.1 pg (ref 26.0–34.0)
MCHC: 32.1 g/dL (ref 30.0–36.0)
MCV: 93.6 fL (ref 80.0–100.0)
Monocytes Absolute: 0.9 10*3/uL (ref 0.1–1.0)
Monocytes Relative: 11 %
Neutro Abs: 5.9 10*3/uL (ref 1.7–7.7)
Neutrophils Relative %: 73 %
Platelets: 227 10*3/uL (ref 150–400)
RBC: 2.99 MIL/uL — ABNORMAL LOW (ref 4.22–5.81)
RDW: 15.2 % (ref 11.5–15.5)
WBC: 8.1 10*3/uL (ref 4.0–10.5)
nRBC: 0 % (ref 0.0–0.2)

## 2023-11-12 LAB — COMPREHENSIVE METABOLIC PANEL
ALT: 20 U/L (ref 0–44)
ALT: 21 U/L (ref 0–44)
AST: 18 U/L (ref 15–41)
AST: 19 U/L (ref 15–41)
Albumin: 3.1 g/dL — ABNORMAL LOW (ref 3.5–5.0)
Albumin: 3.2 g/dL — ABNORMAL LOW (ref 3.5–5.0)
Alkaline Phosphatase: 74 U/L (ref 38–126)
Alkaline Phosphatase: 72 U/L (ref 38–126)
Anion gap: 7 (ref 5–15)
Anion gap: 9 (ref 5–15)
BUN: 16 mg/dL (ref 8–23)
BUN: 15 mg/dL (ref 8–23)
CO2: 26 mmol/L (ref 22–32)
CO2: 25 mmol/L (ref 22–32)
Calcium: 9 mg/dL (ref 8.9–10.3)
Calcium: 9.1 mg/dL (ref 8.9–10.3)
Chloride: 105 mmol/L (ref 98–111)
Chloride: 105 mmol/L (ref 98–111)
Creatinine, Ser: 1.18 mg/dL (ref 0.61–1.24)
Creatinine, Ser: 1.2 mg/dL (ref 0.61–1.24)
GFR, Estimated: >60 mL/min (ref >60)
GFR, Estimated: >60 mL/min (ref >60)
Glucose, Bld: 104 mg/dL — ABNORMAL HIGH (ref 70–99)
Glucose, Bld: 112 mg/dL — ABNORMAL HIGH (ref 70–99)
Potassium: 3.9 mmol/L (ref 3.5–5.1)
Potassium: 3.8 mmol/L (ref 3.5–5.1)
Sodium: 138 mmol/L (ref 135–145)
Sodium: 139 mmol/L (ref 135–145)
Total Bilirubin: 0.4 mg/dL (ref 0.0–1.2)
Total Bilirubin: 0.3 mg/dL (ref 0.0–1.2)
Total Protein: 6.1 g/dL — ABNORMAL LOW (ref 6.5–8.1)
Total Protein: 6.1 g/dL — ABNORMAL LOW (ref 6.5–8.1)

## 2023-11-12 LAB — CBC
HCT: 27.1 % — ABNORMAL LOW (ref 39.0–52.0)
Hemoglobin: 8.8 g/dL — ABNORMAL LOW (ref 13.0–17.0)
MCH: 30.3 pg (ref 26.0–34.0)
MCHC: 32.5 g/dL (ref 30.0–36.0)
MCV: 93.4 fL (ref 80.0–100.0)
Platelets: 229 10*3/uL (ref 150–400)
RBC: 2.9 MIL/uL — ABNORMAL LOW (ref 4.22–5.81)
RDW: 15.2 % (ref 11.5–15.5)
WBC: 6.9 10*3/uL (ref 4.0–10.5)
nRBC: 0 % (ref 0.0–0.2)

## 2023-11-12 LAB — HEMOGLOBIN AND HEMATOCRIT, BLOOD
HCT: 28.4 % — ABNORMAL LOW (ref 39.0–52.0)
Hemoglobin: 8.9 g/dL — ABNORMAL LOW (ref 13.0–17.0)

## 2023-11-12 LAB — ECHOCARDIOGRAM COMPLETE
Height: 72 in
Weight: 3246.4 [oz_av]

## 2023-11-12 LAB — PHOSPHORUS: Phosphorus: 3.2 mg/dL (ref 2.5–4.6)

## 2023-11-12 LAB — MAGNESIUM: Magnesium: 2 mg/dL (ref 1.7–2.4)

## 2023-11-12 MED ORDER — CHLORHEXIDINE GLUCONATE CLOTH 2 % EX PADS
6.0000 | MEDICATED_PAD | Freq: Every day | CUTANEOUS | Status: DC
Start: 2023-11-12 — End: 2023-11-13
  Administered 2023-11-12 – 2023-11-13 (×2): 6 via TOPICAL

## 2023-11-12 NOTE — Progress Notes (Signed)
PROGRESS NOTE    Ryan Cole  ZOX:[id number] DOB: [birth year] DOA: 11/10/2023 PCP: [facility]   Brief Narrative:  HPI per Dr. Merrilyn Puma  Ryan Cole is a 67 y.o. male with medical history significant of stage III prostate cancer status post radical robotic prostatectomy with bladder neck reconstruction (10/23/2023), hypertension, hyperlipidemia, ulcerative colitis, history of thoracic aortic dissection status post repair (2016), chronic HFpEF presenting to the ED with gross hematuria.   Patient states that he was diagnosed with stage III prostate cancer and underwent radical robotic prostatectomy with bladder neck reconstruction on January 6 at [facility]  [facility].  He states that he had a cystoscopy done this past Tuesday and was noted to have bladder leakage at that time.  He was advised to take Macrobid due to his bladder not being fully healed at the time of the cystoscopy and also advised to keep his Foley catheter in place for another 10 to 14 days and follow-up again in early February.  He reports that he has noted ongoing hematuria through his Foley catheter over the past several days, at times with heavy bleeding and at times with clots in his Foley catheter.  States that Thursday, his urine became more clear but then Thursday night he developed some cramping along with gross hematuria again.  His hematuria persisted throughout early Friday and this prompted him to come in for further evaluation.  He denies any fevers, chills, chest pain, shortness of breath, nausea, vomiting, abdominal pain.  He does note some burning with urination.   He does note that he has been having significant pain around his rectum/anus after recent surgery.  He has been taking as needed oxycodone 5 mg but states that there have been several times where he is in significant pain despite taking his oxycodone.   He does note a history of ulcerative colitis for which she  is on mesalamine both suppository and oral.  He also reports a history of thoracic aortic dissection in 2016 status post repair.   ED course: Initial vitals with elevated blood pressure to 180/62.  While in the ED blood pressure has ranged from 140s-170s/50s-60s.  He also had 1 episode of low-grade fever to 100.8 and subsequently defervesced without intervention.  CBC with hemoglobin 9.6 (stable compared to hemoglobin level on January 8 in [place] note from 11/07/2023).  CMP with creatinine 1.33 (baseline appears to be around 1.2).  UA with significant hematuria and microscopy showing no WBCs but many bacteria.  H&H this a.m. with hemoglobin 9.4.  Urology consulted by ED provider.  [facility] hospitalist asked to evaluate patient for admission.  **Interim History Hematuria is improving.  Patient feeling okay but echocardiogram still pending.  Assessment and Plan:  Gross Hematuria Hx of stage 3 prostate cancer s/p radical robotic prostatectomy with bladder neck reconstruction (10/23/2023) -Patient underwent radical robotic prostatectomy with bladder neck [facility] at [facility].   -[facility].  He is now presenting with gross hematuria.   -Recent cystoscopy this past Tuesday revealed that his bladder was not yet fully healed and showed signs of leakage.   -He was advised to start Macrobid and keep Foley catheter in place until next follow-up visit in early February.   -Urology has been consulted.  He was started on slow continuous bladder irrigation and urine appears to be clearing.   -Per urology recommendations, obtaining CT abdomen pelvis to assess if  patient is having worsening bladder injury/leak.  If CT imaging showing signs of intraperitoneal bladder leak, patient may need to be transferred to [facility] for further care.  His hemoglobin trend appears to be stable as below -Urology following, appreciate assistance -Continuous bladder irrigation now discontinued -Follow-up CT  cystogram and showed "Extraperitoneal contrast extravasation about the neck of the bladder, prostate and seminal vesicles consistent with bladder leak of unknown etiology, possibly traumatic Foley insertion. Thickened urinary bladder wall with multiple small diverticula and trabeculation, in keeping with chronic outlet obstruction.  Multiple small prominent perirectal lymph nodes, nonspecific and possibly reactive"  -Given no signs of intraperitoneal bladder leak present no need to transfer patient to [facility]  -Trend H&H every 8 hours, transfuse if hemoglobin less than 7 -SCDs for DVT prophylaxis -Admitted to inpatient/progressive -Pain control with oxycodone 5-325 mg every 6 hours as needed for moderate and severe pain, IV Dilaudid 0.5 mg every 3 hours as needed for breakthrough pain -Continue home Tamsulosin -Further Care per Urology   Fever -Patient did have 1 episode of low-grade fever to 100.8 F and subsequently defervesced without intervention.   -Discussed with urology, who recommended starting patient on appropriate coverage for Enterococcus and Pseudomonas.   -Likely source of fever is urinary. -C/w IV Zosyn and will need to discuss with Urology about Treatment length -Follow-up blood cultures x 2 -Trend fever curve   Thoracic Aortic dissection s/p repair (2016) Atrial Fibrillation -Patient with history of type I aortic dissection with resuspension of his aortic valve.   -He underwent replacement of his arch with grafting individually to innominate and left common carotid arteries.   -Postop course was complicated by atrial fibrillation and flutter which required DC cardioversion.   -Patient is not on anticoagulation for atrial fibrillation.  [facility] -Continue home Lipitor 80 mg daily -Continue home Toprol-XL 100 mg daily -C/w Telemetry Monitoring    HTN -Patient takes lisinopril 40 mg nightly and Toprol-XL 100 mg daily at home.   -He also has clonidine 0.1 mg  which he takes as needed for SBP greater than 180.   -Blood pressure elevated to the 140s-170s/50s-60s while in the ED.  -Resume home Lisinopril and Toprol-X but holding home as needed Clonidine -Continue to Monitor BP per Protocol -Last BP reading was    Chronic HFpEF -TEE in 2016 showing LVEF 45-50% and hypokinesis of anteroseptal myocardium.  -TEE was obtained during time of aortic dissection.  -He does have pitting edema in bilateral LE and reports taking furosemide 20mg  daily at home.  -Unclear if he has had a recent echocardiogram so will obtain one here for further evaluation.  -He denies any orthopnea, PND.  He follows the [facility] cardiology. -F/u ECHO and it is done but pending read -Continue lasix 20mg  daily -Strict I's and O's and Daily Weights  Intake/Output Summary (Last 24 hours) at 11/12/2023 1809 Last data filed at 11/12/2023 1619 Gross per 24 hour  Intake 460 ml  Output 2905 ml  Net -2445 ml  -Continue to Monitor for S/Sx of Volume Overload    CKD stage II -Per chart review, patient appears to be around CKD stage II/IIIa.  -Baseline creatinine appears to be around 1.2.  On admission, creatinine 1.33. -BUN/Cr Trend: Recent Labs  Lab 11/10/23 1545 11/12/23 0900 11/12/23 1130  BUN 14 16 15   CREATININE 1.33* 1.18 1.20  -Continue to monitor Urinary output -Avoid Nephrotoxic Medications, Contrast Dyes, Hypotension and Dehydration to Ensure Adequate Renal Perfusion and will need to Renally  Adjust Meds -Continue to Monitor and Trend Renal Function carefully and repeat CMP in the AM    Ulcerative Colitis -Does not appear to be in an active flare.   -He is taking oral mesalamine 1.2 g 4 times daily and mesalamine suppository 1000 mg nightly. -Continue home oral and rectal mesalamine regimen  Normocytic Anemia/ABLA -Hgb/Hct Trend: Recent Labs  Lab 11/10/23 1545 11/11/23 0541 11/11/23 1121 11/11/23 1904 11/12/23 0900 11/12/23 1130  HGB 9.6* 9.4* 9.0* 9.3* 8.8* 9.0*   HCT 30.3* 29.4* 28.4* 28.0* 27.1* 28.0*  MCV 95.9  --   --   --  93.4 93.6  -Check Anemia Panel in the AM -Continue to Monitor for S/Sx of Bleeding and Gross Hematuria is resolving -Repeat CBC in the AM  Hypoalbuminemia -Patient's Albumin Trend: Recent Labs  Lab 11/10/23 1545 11/12/23 0900 11/12/23 1130  ALBUMIN 3.8 3.1* 3.2*  -Continue to Monitor and Trend and repeat CMP in the AM   DVT prophylaxis: SCDs Start: 11/11/23 0744    Code Status: Full Code Family Communication: Discussed with wife at bedside  Disposition Plan:  Level of care: Progressive Status is: Inpatient Remains inpatient appropriate because: Clinical improvement and further evaluation and clearance by the Urology team   Consultants:  Urology  Procedures:  As delineated as above  Antimicrobials:  Anti-infectives (From admission, onward)    Start     Dose/Rate Route Frequency Ordered Stop   11/11/23 1600  piperacillin-tazobactam (ZOSYN) IVPB 3.375 g        3.375 g 12.5 mL/hr over 240 Minutes Intravenous Every 8 hours 11/11/23 1010     11/11/23 0915  piperacillin-tazobactam (ZOSYN) IVPB 3.375 g        3.375 g 100 mL/hr over 30 Minutes Intravenous  Once 11/11/23 0901 11/11/23 1022       Subjective: Seen and examined at bedside and his urine is clearing up.  No nausea or vomiting. Having some bladder discomfort. No lightheadedness or dizziness. No other concerns or complaints at this time.    Objective: Vitals:   11/12/23 0809 11/12/23 1012 11/12/23 1107 11/12/23 1615  BP: 138/60 (!) 142/57 (!) 157/81 (!) 154/55  Pulse: 76 74 72 63  Resp: 20  19 18   Temp: 97.8 F (36.6 C)  98.7 F (37.1 C) 98.6 F (37 C)  TempSrc: Oral  Oral Oral  SpO2: 96%  97% 100%  Weight:      Height:        Intake/Output Summary (Last 24 hours) at 11/12/2023 1818 Last data filed at 11/12/2023 1619 Gross per 24 hour  Intake 460 ml  Output 2905 ml  Net -2445 ml   Filed Weights   11/11/23 0731 11/11/23 1703  11/12/23 0403  Weight: 89.3 kg 99.2 kg 92 kg   Examination: Physical Exam:  Constitutional: WN/WD overweight African-American male with chronic pain acute distress Respiratory: Diminished to auscultation bilaterally, no wheezing, rales, rhonchi or crackles. Normal respiratory effort and patient is not tachypenic. No accessory muscle use.  Unlabored breathing Cardiovascular: RRR, no murmurs / rubs / gallops. S1 and S2 auscultated. No extremity edema. Abdomen: Soft, non-tender, secondary body habitus. Bowel sounds positive.  GU: Deferred.  Foley catheter is in place Musculoskeletal: No clubbing / cyanosis of digits/nails. No joint deformity upper and lower extremities. Skin: No rashes, lesions, ulcers on a limited skin evaluation. No induration; Warm and dry.  Neurologic: CN 2-12 grossly intact with no focal deficits. Romberg sign and cerebellar reflexes not assessed.  Psychiatric: Normal judgment and  insight. Alert and oriented x 3. Normal mood and appropriate affect.   Data Reviewed: I have personally reviewed following labs and imaging studies  CBC: Recent Labs  Lab 11/10/23 1545 11/11/23 0541 11/11/23 1121 11/11/23 1904 11/12/23 0900 11/12/23 1130  WBC 8.8  --   --   --  6.9 8.1  NEUTROABS 7.3  --   --   --   --  5.9  HGB 9.6* 9.4* 9.0* 9.3* 8.8* 9.0*  HCT 30.3* 29.4* 28.4* 28.0* 27.1* 28.0*  MCV 95.9  --   --   --  93.4 93.6  PLT 230  --   --   --  229 227   Basic Metabolic Panel: Recent Labs  Lab 11/10/23 1545 11/12/23 0900 11/12/23 1130  NA 137 138 139  K 3.8 3.9 3.8  CL 101 105 105  CO2 23 26 25   GLUCOSE 99 104* 112*  BUN 14 16 15   CREATININE 1.33* 1.18 1.20  CALCIUM 9.4 9.0 9.1  MG  --   --  2.0  PHOS  --   --  3.2   GFR: Estimated Creatinine Clearance: 66.5 mL/min (by C-G formula based on SCr of 1.2 mg/dL). Liver Function Tests: Recent Labs  Lab 11/10/23 1545 11/12/23 0900 11/12/23 1130  AST 26 18 19   ALT 28 20 21   ALKPHOS 81 74 72  BILITOT 0.6  0.4 0.3  PROT 6.5 6.1* 6.1*  ALBUMIN 3.8 3.1* 3.2*   No results for input(s): "LIPASE", "AMYLASE" in the last 168 hours. No results for input(s): "AMMONIA" in the last 168 hours. Coagulation Profile: No results for input(s): "INR", "PROTIME" in the last 168 hours. Cardiac Enzymes: No results for input(s): "CKTOTAL", "CKMB", "CKMBINDEX", "TROPONINI" in the last 168 hours. BNP (last 3 results) No results for input(s): "PROBNP" in the last 8760 hours. HbA1C: No results for input(s): "HGBA1C" in the last 72 hours. CBG: No results for input(s): "GLUCAP" in the last 168 hours. Lipid Profile: No results for input(s): "CHOL", "HDL", "LDLCALC", "TRIG", "CHOLHDL", "LDLDIRECT" in the last 72 hours. Thyroid Function Tests: No results for input(s): "TSH", "T4TOTAL", "FREET4", "T3FREE", "THYROIDAB" in the last 72 hours. Anemia Panel: No results for input(s): "VITAMINB12", "FOLATE", "FERRITIN", "TIBC", "IRON", "RETICCTPCT" in the last 72 hours. Sepsis Labs: No results for input(s): "PROCALCITON", "LATICACIDVEN" in the last 168 hours.  Recent Results (from the past 240 hours)  Culture, blood (Routine X 2) w Reflex to ID Panel     Status: None (Preliminary result)   Collection Time: 11/11/23  8:04 AM   Specimen: BLOOD RIGHT HAND  Result Value Ref Range Status   Specimen Description BLOOD RIGHT HAND  Final   Special Requests   Final    BOTTLES DRAWN AEROBIC AND ANAEROBIC Blood Culture results may not be optimal due to an inadequate volume of blood received in culture bottles   Culture   Final    NO GROWTH 1 DAY Performed at [facility], [address], [place], [place] [address]    Report Status PENDING  Incomplete  Culture, blood (Routine X 2) w Reflex to ID Panel     Status: None (Preliminary result)   Collection Time: 11/11/23  8:09 AM   Specimen: BLOOD  Result Value Ref Range Status   Specimen Description BLOOD RIGHT ANTECUBITAL  Final   Special Requests   Final    BOTTLES DRAWN  AEROBIC AND ANAEROBIC Blood Culture results may not be optimal due to an inadequate volume of blood  received in culture bottles   Culture   Final    NO GROWTH 1 DAY Performed at [facility], [address], [place], [place] [address]    Report Status PENDING  Incomplete    Radiology Studies: CT CYSTOGRAM PELVIS Result Date: 11/11/2023 CLINICAL DATA:  Bladder neck obstruction, blood leaking from penis, hematuria status post prostate and bladder reconstruction EXAM: CT CYSTOGRAM (CT PELVIS WITH CONTRAST) TECHNIQUE: Multidetector CT imaging through the pelvis was performed after dilute contrast had been introduced into the bladder for the purposes of performing CT cystography. RADIATION DOSE REDUCTION: This exam was performed according to the departmental dose-optimization program which includes automated exposure control, adjustment of the mA and/or kV according to patient size and/or use of iterative reconstruction technique. CONTRAST:  50mL OMNIPAQUE IOHEXOL 300 MG/ML  SOLN COMPARISON:  None Available. FINDINGS: Urinary Tract: Foley catheter in the bladder with opacification of the urinary bladder lumen. Thickened urinary bladder wall with multiple small diverticula and trabeculation, in keeping with chronic outlet obstruction. Extraperitoneal contrast extravasation about the neck of the bladder, prostate and seminal vesicles (series 10, image 94, series 17, image 104). Bowel:  Unremarkable visualized pelvic bowel loops. Vascular/Lymphatic: Multiple small prominent perirectal lymph nodes (series 10, image 62). No significant vascular abnormality seen. Reproductive:  No mass or other significant abnormality Other:  None. Musculoskeletal: No suspicious bone lesions identified. IMPRESSION: 1. Extraperitoneal contrast extravasation about the neck of the bladder, prostate and seminal vesicles consistent with bladder leak of unknown etiology, possibly traumatic Foley insertion. 2. Thickened urinary bladder  wall with multiple small diverticula and trabeculation, in keeping with chronic outlet obstruction. 3. Multiple small prominent perirectal lymph nodes, nonspecific and possibly reactive. Electronically Signed   By: Jearld Lesch M.D.   On: 11/11/2023 12:10   Scheduled Meds:  atorvastatin  80 mg Oral q1800   Chlorhexidine Gluconate Cloth  6 each Topical Daily   furosemide  20 mg Oral Daily   lisinopril  40 mg Oral QHS   mesalamine  1,000 mg Rectal QHS   mesalamine  1.2 g Oral QID   metoprolol succinate  100 mg Oral Daily   tamsulosin  0.4 mg Oral QHS   Continuous Infusions:  piperacillin-tazobactam (ZOSYN)  IV 3.375 g (11/12/23 1447)    LOS: 1 day   Marguerita Merles, DO [facility] Available via Epic secure chat 7am-7pm After these hours, please refer to coverage provider listed on [email or web address] 11/12/2023, 6:18 PM

## 2023-11-12 NOTE — Progress Notes (Signed)
Subjective: Patient reports improved suprapubic pain. Urine clear  Objective: Vital signs in last 24 hours: Temp:  [97.8 F (36.6 C)-99.2 F (37.3 C)] 98.6 F (37 C) (01/26 1615) Pulse Rate:  [62-76] 63 (01/26 1615) Resp:  [18-20] 18 (01/26 1615) BP: (134-157)/(52-81) 154/55 (01/26 1615) SpO2:  [93 %-100 %] 100 % (01/26 1615) Weight:  [92 kg] 92 kg (01/26 0403)  Intake/Output from previous day: 01/25 0701 - 01/26 0700 In: 200 [IV Piggyback:200] Out: 7305 [Urine:7305] Intake/Output this shift: Total I/O In: 360 [P.O.:360] Out: 1175 [Urine:1175]  Physical Exam:  General:alert, cooperative, and appears stated age GI: soft, non tender, normal bowel sounds, no palpable masses, no organomegaly, no inguinal hernia Male genitalia: not done Extremities: extremities normal, atraumatic, no cyanosis or edema  Lab Results: Recent Labs    11/11/23 1904 11/12/23 0900 11/12/23 1130  HGB 9.3* 8.8* 9.0*  HCT 28.0* 27.1* 28.0*   BMET Recent Labs    11/12/23 0900 11/12/23 1130  NA 138 139  K 3.9 3.8  CL 105 105  CO2 26 25  GLUCOSE 104* 112*  BUN 16 15  CREATININE 1.18 1.20  CALCIUM 9.0 9.1   No results for input(s): "LABPT", "INR" in the last 72 hours. No results for input(s): "LABURIN" in the last 72 hours. Results for orders placed or performed during the hospital encounter of 11/10/23  Culture, blood (Routine X 2) w Reflex to ID Panel     Status: None (Preliminary result)   Collection Time: 11/11/23  8:04 AM   Specimen: BLOOD RIGHT HAND  Result Value Ref Range Status   Specimen Description BLOOD RIGHT HAND  Final   Special Requests   Final    BOTTLES DRAWN AEROBIC AND ANAEROBIC Blood Culture results may not be optimal due to an inadequate volume of blood received in culture bottles   Culture   Final    NO GROWTH 1 DAY Performed at [facility], [address], [place], [place] [address]    Report Status PENDING  Incomplete  Culture, blood (Routine X 2) w  Reflex to ID Panel     Status: None (Preliminary result)   Collection Time: 11/11/23  8:09 AM   Specimen: BLOOD  Result Value Ref Range Status   Specimen Description BLOOD RIGHT ANTECUBITAL  Final   Special Requests   Final    BOTTLES DRAWN AEROBIC AND ANAEROBIC Blood Culture results may not be optimal due to an inadequate volume of blood received in culture bottles   Culture   Final    NO GROWTH 1 DAY Performed at [facility], [address], [place], [place] [address]    Report Status PENDING  Incomplete    Studies/Results: CT CYSTOGRAM PELVIS Result Date: 11/11/2023 CLINICAL DATA:  Bladder neck obstruction, blood leaking from penis, hematuria status post prostate and bladder reconstruction EXAM: CT CYSTOGRAM (CT PELVIS WITH CONTRAST) TECHNIQUE: Multidetector CT imaging through the pelvis was performed after dilute contrast had been introduced into the bladder for the purposes of performing CT cystography. RADIATION DOSE REDUCTION: This exam was performed according to the departmental dose-optimization program which includes automated exposure control, adjustment of the mA and/or kV according to patient size and/or use of iterative reconstruction technique. CONTRAST:  50mL OMNIPAQUE IOHEXOL 300 MG/ML  SOLN COMPARISON:  None Available. FINDINGS: Urinary Tract: Foley catheter in the bladder with opacification of the urinary bladder lumen. Thickened urinary bladder wall with multiple small diverticula and trabeculation, in keeping with chronic outlet obstruction.  Extraperitoneal contrast extravasation about the neck of the bladder, prostate and seminal vesicles (series 10, image 94, series 17, image 104). Bowel:  Unremarkable visualized pelvic bowel loops. Vascular/Lymphatic: Multiple small prominent perirectal lymph nodes (series 10, image 62). No significant vascular abnormality seen. Reproductive:  No mass or other significant abnormality Other:  None. Musculoskeletal: No suspicious bone  lesions identified. IMPRESSION: 1. Extraperitoneal contrast extravasation about the neck of the bladder, prostate and seminal vesicles consistent with bladder leak of unknown etiology, possibly traumatic Foley insertion. 2. Thickened urinary bladder wall with multiple small diverticula and trabeculation, in keeping with chronic outlet obstruction. 3. Multiple small prominent perirectal lymph nodes, nonspecific and possibly reactive. Electronically Signed   By: Jearld Lesch M.D.   On: 11/11/2023 12:10    Assessment/Plan: 30ZS with gross hematuria (resolved) The patient can be discharged from Urology standpoint and can followup in 2 weeks at [facility] with a cystogram   LOS: 1 day   Wilkie Aye 11/12/2023, 6:02 PM

## 2023-11-12 NOTE — Plan of Care (Signed)
  Problem: Nutrition: Goal: Adequate nutrition will be maintained Outcome: Completed/Met   Problem: Elimination: Goal: Will not experience complications related to bowel motility Outcome: Completed/Met   Problem: Pain Managment: Goal: General experience of comfort will improve and/or be controlled Outcome: Completed/Met   Problem: Skin Integrity: Goal: Risk for impaired skin integrity will decrease Outcome: Completed/Met

## 2023-11-12 NOTE — Plan of Care (Signed)
  Problem: Education: Goal: Knowledge of General Education information will improve Description: Including pain rating scale, medication(s)/side effects and non-pharmacologic comfort measures Outcome: Progressing   Problem: Health Behavior/Discharge Planning: Goal: Ability to manage health-related needs will improve Outcome: Progressing   Problem: Coping: Goal: Level of anxiety will decrease Outcome: Progressing   Problem: Pain Managment: Goal: General experience of comfort will improve and/or be controlled Outcome: Progressing   Problem: Safety: Goal: Ability to remain free from injury will improve Outcome: Progressing   Problem: Skin Integrity: Goal: Risk for impaired skin integrity will decrease Outcome: Progressing

## 2023-11-12 NOTE — Hospital Course (Signed)
HPI per Dr. Merrilyn Puma  Ryan Cole is a 68 y.o. male with medical history significant of stage III prostate cancer status post radical robotic prostatectomy with bladder neck reconstruction (10/23/2023), hypertension, hyperlipidemia, ulcerative colitis, history of thoracic aortic dissection status post repair (2016), chronic HFpEF presenting to the ED with gross hematuria.   Patient states that he was diagnosed with stage III prostate cancer and underwent radical robotic prostatectomy with bladder neck reconstruction on January 6 at [facility].  [facility].  He states that he had a cystoscopy done this past Tuesday and was noted to have bladder leakage at that time.  He was advised to take Macrobid due to his bladder not being fully healed at the time of the cystoscopy and also advised to keep his Foley catheter in place for another 10 to 14 days and follow-up again in early February.  He reports that he has noted ongoing hematuria through his Foley catheter over the past several days, at times with heavy bleeding and at times with clots in his Foley catheter.  States that Thursday, his urine became more clear but then Thursday night he developed some cramping along with gross hematuria again.  His hematuria persisted throughout early Friday and this prompted him to come in for further evaluation.  He denies any fevers, chills, chest pain, shortness of breath, nausea, vomiting, abdominal pain.  He does note some burning with urination.   He does note that he has been having significant pain around his rectum/anus after recent surgery.  He has been taking as needed oxycodone 5 mg but states that there have been several times where he is in significant pain despite taking his oxycodone.   He does note a history of ulcerative colitis for which she is on mesalamine both suppository and oral.  He also reports a history of thoracic aortic dissection in 2016 status post repair.   ED  course: Initial vitals with elevated blood pressure to 180/62.  While in the ED blood pressure has ranged from 140s-170s/50s-60s.  He also had 1 episode of low-grade fever to 100.8 and subsequently defervesced without intervention.  CBC with hemoglobin 9.6 (stable compared to hemoglobin level on January 8 in [place] note from 11/07/2023).  CMP with creatinine 1.33 (baseline appears to be around 1.2).  UA with significant hematuria and microscopy showing no WBCs but many bacteria.  H&H this a.m. with hemoglobin 9.4.  Urology consulted by ED provider.  [facility] hospitalist asked to evaluate patient for admission.  **Interim History Hematuria is improving.  Patient feeling okay but echocardiogram was pending but patient refused this to be done.  Urology reevaluated and recommended keeping Foley catheter in place and have another cystogram prior to voiding trial in outpatient setting.  He is medically stable for discharge and they recommend outpatient antibiotics for at least a week week with Bactrim.  Patient is also to follow-up with his cardiologist as he refused to have an echocardiogram done here and will have it done in outpatient setting with the [place].  Assessment and Plan:  Gross Hematuria, improved and resolved Hx of stage 3 prostate cancer s/p radical robotic prostatectomy with bladder neck reconstruction (10/23/2023) -Patient underwent radical robotic prostatectomy with bladder neck reconstruction at [facility].   -[facility].  He is now presenting with gross hematuria.   -Recent cystoscopy this past Tuesday revealed that his bladder was not yet fully healed and showed signs  of leakage.   -He was advised to start Macrobid and keep Foley catheter in place until next follow-up visit in early February.   -Urology has been consulted.  He was started on slow continuous bladder irrigation and urine appears to be clearing.   -Per urology recommendations, obtaining CT abdomen pelvis to assess  if patient is having worsening bladder injury/leak.  If CT imaging showing signs of intraperitoneal bladder leak, patient may need to be transferred to [facility] for further care.  His hemoglobin trend appears to be stable as below -Urology following, appreciate assistance -Continuous bladder irrigation now discontinued -Follow-up CT cystogram and showed "Extraperitoneal contrast extravasation about the neck of the bladder, prostate and seminal vesicles consistent with bladder leak of unknown etiology, possibly traumatic Foley insertion. Thickened urinary bladder wall with multiple small diverticula and trabeculation, in keeping with chronic outlet obstruction.  Multiple small prominent perirectal lymph nodes, nonspecific and possibly reactive"  -Given no signs of intraperitoneal bladder leak present no need to transfer patient to [facility]  -Trend H&H every 8 hours, transfuse if hemoglobin less than 7 -SCDs for DVT prophylaxis -Admitted to inpatient/progressive -Pain control with oxycodone 5-325 mg every 6 hours as needed for moderate and severe pain, IV Dilaudid 0.5 mg every 3 hours as needed for breakthrough pain -Continue home Tamsulosin -Further Care per Urology and they are recommending continuing Foley catheter to remain in place and have another cystogram in the outpatient setting. -Urology has given the patient dicyclomine given his bladder spasms and they have signed off the case   Fever, improved -Patient did have 1 episode of low-grade fever to 100.8 F and subsequently defervesced without intervention.   -Discussed with urology, who recommended starting patient on appropriate coverage for Enterococcus and Pseudomonas.   -Likely source of fever is urinary. -C/w IV Zosyn and will need to discuss with Urology about Treatment length; urology recommends changing to Bactrim for another 7 days -Follow-up blood cultures x 2 showed no growth to date at 3 days -Trend fever curve   Thoracic Aortic  dissection s/p repair (2016) Atrial Fibrillation -Patient with history of type I aortic dissection with resuspension of his aortic valve.   -He underwent replacement of his arch with grafting individually to innominate and left common carotid arteries.   -Postop course was complicated by atrial fibrillation and flutter which required DC cardioversion.   -Patient is not on anticoagulation for atrial fibrillation.  -[facility] and will follow-up in outpatient setting -Continue home Lipitor 80 mg daily -Continue home Toprol-XL 100 mg daily -C/w Telemetry Monitoring while hospitalized   HTN -Patient takes lisinopril 40 mg nightly and Toprol-XL 100 mg daily at home.   -He also has clonidine 0.1 mg which he takes as needed for SBP greater than 180.   -Blood pressure elevated to the 140s-170s/50s-60s while in the ED.  -Resume home Lisinopril and Toprol-X but holding home as needed Clonidine -Continue to Monitor BP per Protocol -Follow-up in outpatient setting   Chronic HFpEF -TEE in 2016 showing LVEF 45-50% and hypokinesis of anteroseptal myocardium.  -TEE was obtained during time of aortic dissection.  -He does have pitting edema in bilateral LE and reports taking furosemide 20mg  daily at home.  -Unclear if he has had a recent echocardiogram so will obtain one here for further evaluation.  -He denies any orthopnea, PND.  He follows the [facility] cardiology. -F/u ECHO and was ordered but never done but patient refused to have it done so we will have it  done in outpatient setting with [facility] -Continue lasix 20mg  daily -Strict I's and O's and Daily Weights No intake or output data in the 24 hours ending 11/14/23 2217 -Continue to Monitor for S/Sx of Volume Overload    CKD stage II, stable -Per chart review, patient appears to be around CKD stage II/IIIa.  -Baseline creatinine appears to be around 1.2.  On admission, creatinine 1.33. -BUN/Cr Trend: Recent Labs  Lab  11/10/23 1545 11/12/23 0900 11/12/23 1130 11/13/23 0237  BUN 14 16 15 13   CREATININE 1.33* 1.18 1.20 1.18  -Continue to monitor Urinary output -Avoid Nephrotoxic Medications, Contrast Dyes, Hypotension and Dehydration to Ensure Adequate Renal Perfusion and will need to Renally Adjust Meds -Continue to Monitor and Trend Renal Function carefully and repeat CMP in the AM    Ulcerative Colitis -Does not appear to be in an active flare.   -He is taking oral mesalamine 1.2 g 4 times daily and mesalamine suppository 1000 mg nightly. -Continue home oral and rectal mesalamine regimen  Normocytic Anemia/ABLA -Hgb/Hct Trend: Recent Labs  Lab 11/11/23 0541 11/11/23 1121 11/11/23 1904 11/12/23 0900 11/12/23 1130 11/12/23 1933 11/13/23 0237  HGB 9.4* 9.0* 9.3* 8.8* 9.0* 8.9* 8.4*  HCT 29.4* 28.4* 28.0* 27.1* 28.0* 28.4* 26.0*  MCV  --   --   --  93.4 93.6  --  94.2  -Check Anemia Panel in outpatient setting -Continue to Monitor for S/Sx of Bleeding and Gross Hematuria is resolving -Repeat CBC in the AM  Hypoalbuminemia -Patient's Albumin Trend: Recent Labs  Lab 11/10/23 1545 11/12/23 0900 11/12/23 1130 11/13/23 0237  ALBUMIN 3.8 3.1* 3.2* 2.9*  -Continue to Monitor and Trend and repeat CMP in the AM

## 2023-11-13 ENCOUNTER — Other Ambulatory Visit (HOSPITAL_COMMUNITY): Payer: Self-pay

## 2023-11-13 DIAGNOSIS — D649 Anemia, unspecified: Secondary | ICD-10-CM | POA: Diagnosis not present

## 2023-11-13 DIAGNOSIS — R310 Gross hematuria: Secondary | ICD-10-CM | POA: Diagnosis not present

## 2023-11-13 LAB — CBC WITH DIFFERENTIAL/PLATELET
Abs Immature Granulocytes: 0.03 10*3/uL (ref 0.00–0.07)
Basophils Absolute: 0 10*3/uL (ref 0.0–0.1)
Basophils Relative: 0 %
Eosinophils Absolute: 0.1 10*3/uL (ref 0.0–0.5)
Eosinophils Relative: 1 %
HCT: 26 % — ABNORMAL LOW (ref 39.0–52.0)
Hemoglobin: 8.4 g/dL — ABNORMAL LOW (ref 13.0–17.0)
Immature Granulocytes: 0 %
Lymphocytes Relative: 16 %
Lymphs Abs: 1.2 10*3/uL (ref 0.7–4.0)
MCH: 30.4 pg (ref 26.0–34.0)
MCHC: 32.3 g/dL (ref 30.0–36.0)
MCV: 94.2 fL (ref 80.0–100.0)
Monocytes Absolute: 0.9 10*3/uL (ref 0.1–1.0)
Monocytes Relative: 13 %
Neutro Abs: 5 10*3/uL (ref 1.7–7.7)
Neutrophils Relative %: 70 %
Platelets: 208 10*3/uL (ref 150–400)
RBC: 2.76 MIL/uL — ABNORMAL LOW (ref 4.22–5.81)
RDW: 14.9 % (ref 11.5–15.5)
WBC: 7.2 10*3/uL (ref 4.0–10.5)
nRBC: 0 % (ref 0.0–0.2)

## 2023-11-13 LAB — PHOSPHORUS: Phosphorus: 4.1 mg/dL (ref 2.5–4.6)

## 2023-11-13 LAB — MAGNESIUM: Magnesium: 1.9 mg/dL (ref 1.7–2.4)

## 2023-11-13 LAB — COMPREHENSIVE METABOLIC PANEL
ALT: 18 U/L (ref 0–44)
AST: 15 U/L (ref 15–41)
Albumin: 2.9 g/dL — ABNORMAL LOW (ref 3.5–5.0)
Alkaline Phosphatase: 64 U/L (ref 38–126)
Anion gap: 9 (ref 5–15)
BUN: 13 mg/dL (ref 8–23)
CO2: 25 mmol/L (ref 22–32)
Calcium: 8.7 mg/dL — ABNORMAL LOW (ref 8.9–10.3)
Chloride: 104 mmol/L (ref 98–111)
Creatinine, Ser: 1.18 mg/dL (ref 0.61–1.24)
GFR, Estimated: >60 mL/min (ref >60)
Glucose, Bld: 105 mg/dL — ABNORMAL HIGH (ref 70–99)
Potassium: 3.8 mmol/L (ref 3.5–5.1)
Sodium: 138 mmol/L (ref 135–145)
Total Bilirubin: 0.2 mg/dL (ref 0.0–1.2)
Total Protein: 5.8 g/dL — ABNORMAL LOW (ref 6.5–8.1)

## 2023-11-13 MED ORDER — OXYCODONE-ACETAMINOPHEN 5-325 MG PO TABS: 1.0000 | ORAL_TABLET | Freq: Four times a day (QID) | ORAL | 0 refills | Status: DC | PRN

## 2023-11-13 MED ORDER — OXYCODONE-ACETAMINOPHEN 5-325 MG PO TABS
1.0000 | ORAL_TABLET | Freq: Four times a day (QID) | ORAL | 0 refills | Status: AC | PRN
  Filled 2023-11-13: qty 12, 3d supply, fill #0

## 2023-11-13 MED ORDER — POLYETHYLENE GLYCOL 3350 17 GM/SCOOP PO POWD
17.0000 g | Freq: Every day | ORAL | 0 refills | Status: AC | PRN
  Filled 2023-11-13: qty 238, 14d supply, fill #0

## 2023-11-13 MED ORDER — HYOSCYAMINE SULFATE 0.125 MG SL SUBL
0.1250 mg | SUBLINGUAL_TABLET | SUBLINGUAL | 0 refills | Status: AC | PRN
  Filled 2023-11-13: qty 14, 3d supply, fill #0

## 2023-11-13 MED ORDER — HYOSCYAMINE SULFATE 0.125 MG SL SUBL: 0.1250 mg | SUBLINGUAL_TABLET | SUBLINGUAL | Status: DC | PRN

## 2023-11-13 MED ORDER — SULFAMETHOXAZOLE-TRIMETHOPRIM 800-160 MG PO TABS
1.0000 | ORAL_TABLET | Freq: Two times a day (BID) | ORAL | 0 refills | Status: AC
  Filled 2023-11-13: qty 14, 7d supply, fill #0

## 2023-11-13 MED ORDER — POLYETHYLENE GLYCOL 3350 17 G PO PACK: 17.0000 g | PACK | Freq: Every day | ORAL | 0 refills | Status: DC | PRN

## 2023-11-13 MED ORDER — SULFAMETHOXAZOLE-TRIMETHOPRIM 800-160 MG PO TABS: 1.0000 | ORAL_TABLET | Freq: Two times a day (BID) | ORAL | 0 refills | Status: DC

## 2023-11-13 NOTE — Progress Notes (Addendum)
TOC meds given to patient and wife, reviewed 5 rights of medications with them, no concerns noted. Pt aware to call primary doctor for future medication questions and to f/u with urology appts. Offered pt leg bag for easier management while at home, pt wants the foley bag here instead, foley bag changed to bag without measuring container for easier management at home. Showed pt and wife how to unclamp and clamp foley bag, pt demonstrated it before d/c.

## 2023-11-13 NOTE — Discharge Summary (Signed)
Physician Discharge Summary   Patient: Ryan Cole MRN: [id number] DOB: 1956/01/07  Admit date:     11/10/2023  Discharge date: 11/13/23  Discharge Physician: Marguerita Merles, DO   PCP: [facility]   Recommendations at discharge:   Follow-up with PCP within 1 to 2 weeks and repeat CBC, CMP, mag, Phos within 1 week Follow-up with urology in outpatient setting Follow-up with cardiology in outpatient setting and repeat echocardiogram as patient refused to have it done here  Discharge Diagnoses: Principal Problem:   Gross hematuria Active Problems:   Hematuria  Resolved Problems:   * No resolved hospital problems. *  Hospital Course: HPI per Dr. Merrilyn Puma  Ryan Cole is a 68 y.o. male with medical history significant of stage III prostate cancer status post radical robotic prostatectomy with bladder neck reconstruction (10/23/2023), hypertension, hyperlipidemia, ulcerative colitis, history of thoracic aortic dissection status post repair (2016), chronic HFpEF presenting to the ED with gross hematuria.   Patient states that he was diagnosed with stage III prostate cancer and underwent radical robotic prostatectomy with bladder neck reconstruction on January 6 at [facility].  [facility].  He states that he had a cystoscopy done this past Tuesday and was noted to have bladder leakage at that time.  He was advised to take Macrobid due to his bladder not being fully healed at the time of the cystoscopy and also advised to keep his Foley catheter in place for another 10 to 14 days and follow-up again in early February.  He reports that he has noted ongoing hematuria through his Foley catheter over the past several days, at times with heavy bleeding and at times with clots in his Foley catheter.  States that Thursday, his urine became more clear but then Thursday night he developed some cramping along with gross hematuria again.  His hematuria persisted  throughout early Friday and this prompted him to come in for further evaluation.  He denies any fevers, chills, chest pain, shortness of breath, nausea, vomiting, abdominal pain.  He does note some burning with urination.   He does note that he has been having significant pain around his rectum/anus after recent surgery.  He has been taking as needed oxycodone 5 mg but states that there have been several times where he is in significant pain despite taking his oxycodone.   He does note a history of ulcerative colitis for which she is on mesalamine both suppository and oral.  He also reports a history of thoracic aortic dissection in 2016 status post repair.   ED course: Initial vitals with elevated blood pressure to 180/62.  While in the ED blood pressure has ranged from 140s-170s/50s-60s.  He also had 1 episode of low-grade fever to 100.8 and subsequently defervesced without intervention.  CBC with hemoglobin 9.6 (stable compared to hemoglobin level on January 8 in [place] note from 11/07/2023).  CMP with creatinine 1.33 (baseline appears to be around 1.2).  UA with significant hematuria and microscopy showing no WBCs but many bacteria.  H&H this a.m. with hemoglobin 9.4.  Urology consulted by ED provider.  [facility] hospitalist asked to evaluate patient for admission.  **Interim History Hematuria is improving.  Patient feeling okay but echocardiogram was pending but patient refused this to be done.  Urology reevaluated and recommended keeping Foley catheter in place and have another cystogram prior to voiding trial in outpatient setting.  He is medically stable for discharge and they  recommend outpatient antibiotics for at least a week week with Bactrim.  Patient is also to follow-up with his cardiologist as he refused to have an echocardiogram done here and will have it done in outpatient setting with the [place].  Assessment and Plan:  Gross Hematuria, improved and resolved Hx of stage 3 prostate cancer s/p  radical robotic prostatectomy with bladder neck reconstruction (10/23/2023) -Patient underwent radical robotic prostatectomy with bladder neck reconstruction at [facility].   -[facility].  He is now presenting with gross hematuria.   -Recent cystoscopy this past Tuesday revealed that his bladder was not yet fully healed and showed signs of leakage.   -He was advised to start Macrobid and keep Foley catheter in place until next follow-up visit in early February.   -Urology has been consulted.  He was started on slow continuous bladder irrigation and urine appears to be clearing.   -Per urology recommendations, obtaining CT abdomen pelvis to assess if patient is having worsening bladder injury/leak.  If CT imaging showing signs of intraperitoneal bladder leak, patient may need to be transferred to [facility] for further care.  His hemoglobin trend appears to be stable as below -Urology following, appreciate assistance -Continuous bladder irrigation now discontinued -Follow-up CT cystogram and showed "Extraperitoneal contrast extravasation about the neck of the bladder, prostate and seminal vesicles consistent with bladder leak of unknown etiology, possibly traumatic Foley insertion. Thickened urinary bladder wall with multiple small diverticula and trabeculation, in keeping with chronic outlet obstruction.  Multiple small prominent perirectal lymph nodes, nonspecific and possibly reactive"  -Given no signs of intraperitoneal bladder leak present no need to transfer patient to [facility]  -Trend H&H every 8 hours, transfuse if hemoglobin less than 7 -SCDs for DVT prophylaxis -Admitted to inpatient/progressive -Pain control with oxycodone 5-325 mg every 6 hours as needed for moderate and severe pain, IV Dilaudid 0.5 mg every 3 hours as needed for breakthrough pain -Continue home Tamsulosin -Further Care per Urology and they are recommending continuing Foley catheter to remain in place and have  another cystogram in the outpatient setting. -Urology has given the patient dicyclomine given his bladder spasms and they have signed off the case   Fever, improved -Patient did have 1 episode of low-grade fever to 100.8 F and subsequently defervesced without intervention.   -Discussed with urology, who recommended starting patient on appropriate coverage for Enterococcus and Pseudomonas.   -Likely source of fever is urinary. -C/w IV Zosyn and will need to discuss with Urology about Treatment length; urology recommends changing to Bactrim for another 7 days -Follow-up blood cultures x 2 showed no growth to date at 3 days -Trend fever curve   Thoracic Aortic dissection s/p repair (2016) Atrial Fibrillation -Patient with history of type I aortic dissection with resuspension of his aortic valve.   -He underwent replacement of his arch with grafting individually to innominate and left common carotid arteries.   -Postop course was complicated by atrial fibrillation and flutter which required DC cardioversion.   -Patient is not on anticoagulation for atrial fibrillation.  -[facility] and will follow-up in outpatient setting -Continue home Lipitor 80 mg daily -Continue home Toprol-XL 100 mg daily -C/w Telemetry Monitoring while hospitalized   HTN -Patient takes lisinopril 40 mg nightly and Toprol-XL 100 mg daily at home.   -He also has clonidine 0.1 mg which he takes as needed for SBP greater than 180.   -Blood pressure elevated to the 140s-170s/50s-60s while in the  ED.  -Resume home Lisinopril and Toprol-X but holding home as needed Clonidine -Continue to Monitor BP per Protocol -Follow-up in outpatient setting   Chronic HFpEF -TEE in 2016 showing LVEF 45-50% and hypokinesis of anteroseptal myocardium.  -TEE was obtained during time of aortic dissection.  -He does have pitting edema in bilateral LE and reports taking furosemide 20mg  daily at home.  -Unclear if he has had a  recent echocardiogram so will obtain one here for further evaluation.  -He denies any orthopnea, PND.  He follows the [facility] cardiology. -F/u ECHO and was ordered but never done but patient refused to have it done so we will have it done in outpatient setting with [facility] -Continue lasix 20mg  daily -Strict I's and O's and Daily Weights No intake or output data in the 24 hours ending 11/14/23 2217 -Continue to Monitor for S/Sx of Volume Overload    CKD stage II, stable -Per chart review, patient appears to be around CKD stage II/IIIa.  -Baseline creatinine appears to be around 1.2.  On admission, creatinine 1.33. -BUN/Cr Trend: Recent Labs  Lab 11/10/23 1545 11/12/23 0900 11/12/23 1130 11/13/23 0237  BUN 14 16 15 13   CREATININE 1.33* 1.18 1.20 1.18  -Continue to monitor Urinary output -Avoid Nephrotoxic Medications, Contrast Dyes, Hypotension and Dehydration to Ensure Adequate Renal Perfusion and will need to Renally Adjust Meds -Continue to Monitor and Trend Renal Function carefully and repeat CMP in the AM    Ulcerative Colitis -Does not appear to be in an active flare.   -He is taking oral mesalamine 1.2 g 4 times daily and mesalamine suppository 1000 mg nightly. -Continue home oral and rectal mesalamine regimen  Normocytic Anemia/ABLA -Hgb/Hct Trend: Recent Labs  Lab 11/11/23 0541 11/11/23 1121 11/11/23 1904 11/12/23 0900 11/12/23 1130 11/12/23 1933 11/13/23 0237  HGB 9.4* 9.0* 9.3* 8.8* 9.0* 8.9* 8.4*  HCT 29.4* 28.4* 28.0* 27.1* 28.0* 28.4* 26.0*  MCV  --   --   --  93.4 93.6  --  94.2  -Check Anemia Panel in outpatient setting -Continue to Monitor for S/Sx of Bleeding and Gross Hematuria is resolving -Repeat CBC in the AM  Hypoalbuminemia -Patient's Albumin Trend: Recent Labs  Lab 11/10/23 1545 11/12/23 0900 11/12/23 1130 11/13/23 0237  ALBUMIN 3.8 3.1* 3.2* 2.9*  -Continue to Monitor and Trend and repeat CMP in the AM  Consultants:  Urology Procedures performed: As delineated above Disposition: Home Diet recommendation:  Discharge Diet Orders (From admission, onward)     Start     Ordered   11/13/23 0000  Diet - low sodium heart healthy        11/13/23 1332           Cardiac diet DISCHARGE MEDICATION: Allergies as of 11/13/2023   No Known Allergies      Medication List     STOP taking these medications    nitrofurantoin (macrocrystal-monohydrate) 100 MG capsule Commonly known as: MACROBID   oxyCODONE 5 MG immediate release tablet Commonly known as: Oxy IR/ROXICODONE       TAKE these medications    acetaminophen 500 MG tablet Commonly known as: TYLENOL Take 2 tablets (1,000 mg total) by mouth every 6 (six) hours as needed for mild pain.   aspirin EC 81 MG tablet Take 1 tablet (81 mg total) by mouth daily.   atorvastatin 80 MG tablet Commonly known as: LIPITOR Take 1 tablet (80 mg total) by mouth daily at 6 PM.   Biotin 62130 MCG Tbdp  Take 1 capsule by mouth daily.   cloNIDine 0.1 MG tablet Commonly known as: CATAPRES Take 1 tablet (0.1 mg total) by mouth 2 (two) times daily. What changed:  when to take this reasons to take this additional instructions   Cyanocobalamin 500 MCG Chew Chew 1 tablet by mouth daily.   diclofenac 50 MG EC tablet Commonly known as: VOLTAREN Take 50 mg by mouth as needed for moderate pain (pain score 4-6) or mild pain (pain score 1-3).   ferrous sulfate 325 (65 FE) MG EC tablet Take 325 mg by mouth daily with breakfast.   furosemide 20 MG tablet Commonly known as: LASIX Take 20 mg by mouth daily.   hyoscyamine 0.125 MG SL tablet Commonly known as: LEVSIN SL Place 1 tablet (0.125 mg total) under the tongue every 4 (four) hours as needed.   lisinopril 40 MG tablet Commonly known as: ZESTRIL Take 40 mg by mouth at bedtime.   mesalamine 1.2 g EC tablet Commonly known as: LIALDA Take 1.2 g by mouth 4 (four) times daily. Take with meals    mesalamine 1000 MG suppository Commonly known as: CANASA Place 1,000 mg rectally at bedtime.   metoprolol succinate 100 MG 24 hr tablet Commonly known as: TOPROL-XL TAKE ONE-HALF TABLET BY MOUTH TWO TIMES A DAY FOR HEART AND BLOOD PRESSURE   multivitamin with minerals Tabs tablet Take 1 tablet by mouth daily.   oxybutynin 5 MG tablet Commonly known as: DITROPAN Take 5 mg by mouth every 8 (eight) hours as needed.   oxyCODONE-acetaminophen 5-325 MG tablet Commonly known as: PERCOCET/ROXICET Take 1 tablet by mouth every 6 (six) hours as needed for up to 3 days for moderate pain (pain score 4-6) or severe pain (pain score 7-10).   polyethylene glycol powder 17 GM/SCOOP powder Commonly known as: GLYCOLAX/MIRALAX Take 1 capful (17 g) by mouth daily as needed for mild constipation.   Potassium 99 MG Tabs Take 2 tablets by mouth daily.   senna-docusate 8.6-50 MG tablet Commonly known as: Senokot-S Take 2 tablets by mouth 2 (two) times daily.   sulfamethoxazole-trimethoprim 800-160 MG tablet Commonly known as: Bactrim DS Take 1 tablet by mouth 2 (two) times daily for 7 days.   tamsulosin 0.4 MG Caps capsule Commonly known as: FLOMAX Take 0.4 mg by mouth at bedtime.        Follow-up Information     [facility]   Specialty: General Practice Why: Please follow up in a week. Contact information: [address] [place] [address] [phone]                Discharge Exam: Filed Weights   11/11/23 1703 11/12/23 0403 11/13/23 0433  Weight: 99.2 kg 92 kg 90.6 kg   Vitals:   11/13/23 0825 11/13/23 1110  BP: 130/63 (!) 123/57  Pulse: 75 62  Resp: 20 20  Temp: 98.7 F (37.1 C) 98.3 F (36.8 C)  SpO2: 97% 98%   Examination: Physical Exam:  Constitutional: WN/WD overweight African-American male in no acute distress who appears calm Respiratory: Diminished to auscultation bilaterally, no wheezing, rales, rhonchi or crackles. Normal  respiratory effort and patient is not tachypenic. No accessory muscle use.  Unlabored breathing Cardiovascular: RRR, no murmurs / rubs / gallops. S1 and S2 auscultated.  Extremity edema Abdomen: Soft, non-tender, mild distended secondary body habitus. Bowel sounds positive.  GU: Deferred.  Foley catheter in place Musculoskeletal: No clubbing / cyanosis of digits/nails. No joint deformity upper and lower  extremities.  Skin: No rashes, lesions, ulcers on a limited skin evaluation. No induration; Warm and dry.  Neurologic: CN 2-12 grossly intact with no focal deficits. Romberg sign and cerebellar reflexes not assessed.  Psychiatric: Normal judgment and insight. Alert and oriented x 3. Normal mood and appropriate affect.   Condition at discharge: stable  The results of significant diagnostics from this hospitalization (including imaging, microbiology, ancillary and laboratory) are listed below for reference.   Imaging Studies: CT CYSTOGRAM PELVIS Result Date: 11/11/2023 CLINICAL DATA:  Bladder neck obstruction, blood leaking from penis, hematuria status post prostate and bladder reconstruction EXAM: CT CYSTOGRAM (CT PELVIS WITH CONTRAST) TECHNIQUE: Multidetector CT imaging through the pelvis was performed after dilute contrast had been introduced into the bladder for the purposes of performing CT cystography. RADIATION DOSE REDUCTION: This exam was performed according to the departmental dose-optimization program which includes automated exposure control, adjustment of the mA and/or kV according to patient size and/or use of iterative reconstruction technique. CONTRAST:  50mL OMNIPAQUE IOHEXOL 300 MG/ML  SOLN COMPARISON:  None Available. FINDINGS: Urinary Tract: Foley catheter in the bladder with opacification of the urinary bladder lumen. Thickened urinary bladder wall with multiple small diverticula and trabeculation, in keeping with chronic outlet obstruction. Extraperitoneal contrast extravasation  about the neck of the bladder, prostate and seminal vesicles (series 10, image 94, series 17, image 104). Bowel:  Unremarkable visualized pelvic bowel loops. Vascular/Lymphatic: Multiple small prominent perirectal lymph nodes (series 10, image 62). No significant vascular abnormality seen. Reproductive:  No mass or other significant abnormality Other:  None. Musculoskeletal: No suspicious bone lesions identified. IMPRESSION: 1. Extraperitoneal contrast extravasation about the neck of the bladder, prostate and seminal vesicles consistent with bladder leak of unknown etiology, possibly traumatic Foley insertion. 2. Thickened urinary bladder wall with multiple small diverticula and trabeculation, in keeping with chronic outlet obstruction. 3. Multiple small prominent perirectal lymph nodes, nonspecific and possibly reactive. Electronically Signed   By: Jearld Lesch M.D.   On: 11/11/2023 12:10   Microbiology: Results for orders placed or performed during the hospital encounter of 11/10/23  Culture, blood (Routine X 2) w Reflex to ID Panel     Status: None (Preliminary result)   Collection Time: 11/11/23  8:04 AM   Specimen: BLOOD RIGHT HAND  Result Value Ref Range Status   Specimen Description BLOOD RIGHT HAND  Final   Special Requests   Final    BOTTLES DRAWN AEROBIC AND ANAEROBIC Blood Culture results may not be optimal due to an inadequate volume of blood received in culture bottles   Culture   Final    NO GROWTH 3 DAYS Performed at [facility], [address], [place], [place] [address]    Report Status PENDING  Incomplete  Culture, blood (Routine X 2) w Reflex to ID Panel     Status: None (Preliminary result)   Collection Time: 11/11/23  8:09 AM   Specimen: BLOOD  Result Value Ref Range Status   Specimen Description BLOOD RIGHT ANTECUBITAL  Final   Special Requests   Final    BOTTLES DRAWN AEROBIC AND ANAEROBIC Blood Culture results may not be optimal due to an inadequate volume of  blood received in culture bottles   Culture   Final    NO GROWTH 3 DAYS Performed at [facility], [address], [place], [place] [address]    Report Status PENDING  Incomplete   Labs: CBC: Recent Labs  Lab 11/10/23 1545 11/11/23 0541 11/11/23  1904 11/12/23 0900 11/12/23 1130 11/12/23 1933 11/13/23 0237  WBC 8.8  --   --  6.9 8.1  --  7.2  NEUTROABS 7.3  --   --   --  5.9  --  5.0  HGB 9.6*   < > 9.3* 8.8* 9.0* 8.9* 8.4*  HCT 30.3*   < > 28.0* 27.1* 28.0* 28.4* 26.0*  MCV 95.9  --   --  93.4 93.6  --  94.2  PLT 230  --   --  229 227  --  208   < > = values in this interval not displayed.   Basic Metabolic Panel: Recent Labs  Lab 11/10/23 1545 11/12/23 0900 11/12/23 1130 11/13/23 0237  NA 137 138 139 138  K 3.8 3.9 3.8 3.8  CL 101 105 105 104  CO2 23 26 25 25   GLUCOSE 99 104* 112* 105*  BUN 14 16 15 13   CREATININE 1.33* 1.18 1.20 1.18  CALCIUM 9.4 9.0 9.1 8.7*  MG  --   --  2.0 1.9  PHOS  --   --  3.2 4.1   Liver Function Tests: Recent Labs  Lab 11/10/23 1545 11/12/23 0900 11/12/23 1130 11/13/23 0237  AST 26 18 19 15   ALT 28 20 21 18   ALKPHOS 81 74 72 64  BILITOT 0.6 0.4 0.3 0.2  PROT 6.5 6.1* 6.1* 5.8*  ALBUMIN 3.8 3.1* 3.2* 2.9*   CBG: No results for input(s): "GLUCAP" in the last 168 hours.  Discharge time spent: greater than 30 minutes.  Signed: Marguerita Merles, DO [facility] 11/14/2023

## 2023-11-13 NOTE — Progress Notes (Signed)
     Subjective: First time meeting Ryan Cole.  He was completed by Ryan Cole.  We reviewed Ryan surgical history and case and plan.  All questions were answered to their satisfaction.  He is scheduled to discharge today.  Objective: Vital signs in last 24 hours: Temp:  [98.3 F (36.8 C)-99.7 F (37.6 C)] 98.3 F (36.8 C) (01/27 1110) Pulse Rate:  [62-75] 62 (01/27 1110) Resp:  [18-20] 20 (01/27 1110) BP: (123-154)/(54-63) 123/57 (01/27 1110) SpO2:  [95 %-100 %] 98 % (01/27 1110) Weight:  [90.6 kg] 90.6 kg (01/27 0433)  Assessment/Plan: # prostate cancer #hematuria- resolved #bladder neck anastomosis leak  [facility] [place]/23/23.  Hematuria following flexible cystoscopy on 11/07/2023.  This resulted in clot obstruction of urine.  Hematuria has since resolved.  Foley catheter exchanged for 7f three-way in the emergency department.   CT cystogram showed extraperitoneal bladder leak.  Foley catheter is adequate and will stay in place until outpatient follow-up where he will likely have another cystogram prior to voiding trial.  Ongoing bladder spasm refractory to Ditropan.  I have provided him with hyoscyamine in the meantime.  Urinalysis showed no bacteria and no growth on culture.  Considering Ryan extraperitoneal leak, risk versus benefit favors treating empirically with ABX.  Recommend at least 1 week of IDSA/AUA recommended ABX, such as Bactrim.  Patient to follow-up closely with Ryan regular urologist within the [place].  Okay to discharge once cleared medically.   Intake/Output from previous day: 01/26 0701 - 01/27 0700 In: 600 [P.O.:600] Out: 2375 [Urine:2375]  Intake/Output this shift: Total I/O In: 120 [P.O.:120] Out: -   Physical Exam:  General: Alert and oriented CV: No cyanosis Lungs: equal chest rise Gu: 24F 3-way Foley catheter in place with irrigation port plugged.  Draining clear yellow  urine.  Lab Results: Recent Labs    11/12/23 1130 11/12/23 1933 11/13/23 0237  HGB 9.0* 8.9* 8.4*  HCT 28.0* 28.4* 26.0*   BMET Recent Labs    11/12/23 1130 11/13/23 0237  NA 139 138  K 3.8 3.8  CL 105 104  CO2 25 25  GLUCOSE 112* 105*  BUN 15 13  CREATININE 1.20 1.18  CALCIUM 9.1 8.7*     Studies/Results: No results found.    LOS: 2 days   Elmon Kirschner, NP [facility] Pager: [phone]  11/13/2023, 3:01 PM

## 2023-11-13 NOTE — Plan of Care (Signed)

## 2023-11-13 NOTE — TOC Initial Note (Signed)
Transition of Care [facility]) - Initial/Assessment Note    Patient Details  Name: Ryan Cole MRN: [id number] Date of Birth: Dec 26, 1955  Transition of [facility]) CM/SW Contact:    Leone Haven, RN Phone Number: 11/13/2023, 12:07 PM  Clinical Narrative:                 From home with spouse, has PCP Dr. Judie Grieve with [facility], and insurance on file, states has no HH services in place at this time or DME at home.  States wife will transport him home at [employer] and family is support system, states gets medications from [facility] and [address] on [place] and [employer].   Pta self ambulatory.  Expected Discharge Plan: Home/Self Care Barriers to Discharge: No Barriers Identified   Patient Goals and CMS Choice Patient states their goals for this hospitalization and ongoing recovery are:: return home   Choice offered to / list presented to : NA      Expected Discharge Plan and Services   Discharge Planning Services: CM Consult Post Acute Care Choice: NA Living arrangements for the past 2 months: Single Family Home                 DME Arranged: N/A DME Agency: NA       HH Arranged: NA          Prior Living Arrangements/Services Living arrangements for the past 2 months: Single Family Home Lives with:: Spouse Patient language and need for interpreter reviewed:: Yes Do you feel safe going back to the place where you live?: Yes      Need for Family Participation in Patient Care: Yes (Comment) Care giver support system in place?: Yes (comment)   Criminal Activity/Legal Involvement Pertinent to Current Situation/Hospitalization: No - Comment as needed  Activities of Daily Living   ADL Screening (condition at time of admission) Independently performs ADLs?: Yes (appropriate for developmental age) Is the patient deaf or have difficulty hearing?: No Does the patient have difficulty seeing, even when wearing glasses/contacts?: No Does the patient have difficulty concentrating,  remembering, or making decisions?: No  Permission Sought/Granted Permission sought to share information with : Case Manager Permission granted to share information with : Yes, Verbal Permission Granted              Emotional Assessment Appearance:: Appears stated age Attitude/Demeanor/Rapport: Engaged Affect (typically observed): Appropriate Orientation: : Oriented to Self, Oriented to Place, Oriented to  Time, Oriented to Situation Alcohol / Substance Use: Not Applicable Psych Involvement: No (comment)  Admission diagnosis:  Gross hematuria [R31.0] Problem with Foley catheter, initial encounter (HCC) [W29.9XXA] Hematuria, unspecified type [R31.9] Patient Active Problem List   Diagnosis Date Noted   Hematuria 11/12/2023   Gross hematuria 11/11/2023   AKI (acute kidney injury) (HCC) 08/30/2015   Sepsis due to Klebsiella pneumoniae (HCC) 08/30/2015   Pleural effusion on left    Gram-negative bacteremia 08/29/2015   Chronic systolic CHF (congestive heart failure) (HCC) 08/29/2015   Urinary tract infectious disease    Urinary tract infection 08/28/2015   Sepsis (HCC) 08/28/2015   Leukocytosis 08/28/2015   Sinus tachycardia 08/28/2015   Lower urinary tract infectious disease 08/28/2015   Urinary retention 08/28/2015   Abnormal echocardiogram    Elevated troponin    HTN (hypertension) 08/09/2015   Ulcerative colitis (HCC) 08/09/2015   Atrial fibrillation (HCC) 08/09/2015   Thoracic aortic dissection (HCC) 08/03/2015   Abdominal pain, other specified site 05/30/2011   Preventative health care  05/28/2011   HYPOGONADISM, MALE 10/10/2007   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   DYSPHORIA 10/10/2007   Hemiplegia (HCC) 10/10/2007   HYPERLIPIDEMIA 06/08/2007   LOW BACK PAIN 06/08/2007   History of cardiovascular disorder 06/08/2007   PCP:  [facility]:   [id number] [place], [place] - [address] AT [employer] [address] [facility] [address] [place] [address] Phone: [phone] Fax: [phone]     Social Drivers of Health (SDOH) Social History: SDOH Screenings   Food Insecurity: No Food Insecurity (11/11/2023)  Housing: Low Risk  (11/11/2023)  Transportation Needs: No Transportation Needs (11/11/2023)  Utilities: Not At Risk (11/11/2023)  Social Connections: Moderately Isolated (11/11/2023)  Tobacco Use: Unknown (11/10/2023)   SDOH Interventions:     Readmission Risk Interventions     No data to display

## 2023-11-13 NOTE — TOC Transition Note (Signed)
Transition of Care [facility]) - Discharge Note   Patient Details  Name: Ryan Cole MRN: [id number] Date of Birth: 03/18/1956  Transition of Care [facility]) CM/SW Contact:  Leone Haven, RN Phone Number: 11/13/2023, 12:09 PM   Clinical Narrative:    Patient is for dc today, he has a foley cath that he is going home with and will follow up with urology on 2/4, patient states he does not need a HHRN because he will be having follow up. Wife will transport him home at dc.   Final next level of care: Home/Self Care Barriers to Discharge: No Barriers Identified   Patient Goals and CMS Choice Patient states their goals for this hospitalization and ongoing recovery are:: return home   Choice offered to / list presented to : NA      Discharge Placement                       Discharge Plan and Services Additional resources added to the After Visit Summary for     Discharge Planning Services: CM Consult Post Acute Care Choice: NA          DME Arranged: N/A DME Agency: NA       HH Arranged: NA          Social Drivers of Health (SDOH) Interventions SDOH Screenings   Food Insecurity: No Food Insecurity (11/11/2023)  Housing: Low Risk  (11/11/2023)  Transportation Needs: No Transportation Needs (11/11/2023)  Utilities: Not At Risk (11/11/2023)  Social Connections: Moderately Isolated (11/11/2023)  Tobacco Use: Unknown (11/10/2023)     Readmission Risk Interventions     No data to display

## 2023-11-16 LAB — CULTURE, BLOOD (ROUTINE X 2)
Culture: NO GROWTH
Culture: NO GROWTH

## 2023-12-20 ENCOUNTER — Encounter (HOSPITAL_COMMUNITY): Payer: Self-pay

## 2023-12-20 ENCOUNTER — Encounter (HOSPITAL_COMMUNITY): Payer: Self-pay | Admitting: Physician Assistant

## 2023-12-20 ENCOUNTER — Ambulatory Visit (HOSPITAL_COMMUNITY)
Admission: EM | Admit: 2023-12-20 | Discharge: 2023-12-20 | Disposition: A | Discharge status: Home / Self Care | Attending: Physician Assistant | Admitting: Physician Assistant

## 2023-12-20 DIAGNOSIS — N390 Urinary tract infection, site not specified: Secondary | ICD-10-CM | POA: Insufficient documentation

## 2023-12-20 DIAGNOSIS — T83511A Infection and inflammatory reaction due to indwelling urethral catheter, initial encounter: Secondary | ICD-10-CM | POA: Insufficient documentation

## 2023-12-20 LAB — CBC WITH DIFFERENTIAL/PLATELET
Abs Immature Granulocytes: 0.01 10*3/uL (ref 0.00–0.07)
Basophils Absolute: 0 10*3/uL (ref 0.0–0.1)
Basophils Relative: 1 %
Eosinophils Absolute: 0.2 10*3/uL (ref 0.0–0.5)
Eosinophils Relative: 4 %
HCT: 33.3 % — ABNORMAL LOW (ref 39.0–52.0)
Hemoglobin: 10.5 g/dL — ABNORMAL LOW (ref 13.0–17.0)
Immature Granulocytes: 0 %
Lymphocytes Relative: 33 %
Lymphs Abs: 1.4 10*3/uL (ref 0.7–4.0)
MCH: 29.7 pg (ref 26.0–34.0)
MCHC: 31.5 g/dL (ref 30.0–36.0)
MCV: 94.3 fL (ref 80.0–100.0)
Monocytes Absolute: 0.4 10*3/uL (ref 0.1–1.0)
Monocytes Relative: 9 %
Neutro Abs: 2.4 10*3/uL (ref 1.7–7.7)
Neutrophils Relative %: 53 %
Platelets: 172 10*3/uL (ref 150–400)
RBC: 3.53 MIL/uL — ABNORMAL LOW (ref 4.22–5.81)
RDW: 16.1 % — ABNORMAL HIGH (ref 11.5–15.5)
WBC: 4.4 10*3/uL (ref 4.0–10.5)
nRBC: 0 % (ref 0.0–0.2)

## 2023-12-20 LAB — POCT URINALYSIS DIP (MANUAL ENTRY)
Bilirubin, UA: NEGATIVE
Glucose, UA: NEGATIVE mg/dL
Ketones, POC UA: NEGATIVE mg/dL
Nitrite, UA: NEGATIVE
Protein Ur, POC: 30 mg/dL — AB
Spec Grav, UA: 1.01 (ref 1.010–1.025)
Urobilinogen, UA: 0.2 U/dL
pH, UA: 6.5 (ref 5.0–8.0)

## 2023-12-20 LAB — BASIC METABOLIC PANEL
Anion gap: 10 (ref 5–15)
BUN: 14 mg/dL (ref 8–23)
CO2: 26 mmol/L (ref 22–32)
Calcium: 9.4 mg/dL (ref 8.9–10.3)
Chloride: 104 mmol/L (ref 98–111)
Creatinine, Ser: 0.93 mg/dL (ref 0.61–1.24)
GFR, Estimated: >60 mL/min (ref >60)
Glucose, Bld: 87 mg/dL (ref 70–99)
Potassium: 4.1 mmol/L (ref 3.5–5.1)
Sodium: 140 mmol/L (ref 135–145)

## 2023-12-20 MED ORDER — CEFTRIAXONE SODIUM 1 G IJ SOLR
INTRAMUSCULAR | Status: AC
  Filled 2023-12-20: qty 10

## 2023-12-20 MED ORDER — SULFAMETHOXAZOLE-TRIMETHOPRIM 800-160 MG PO TABS: 1.0000 | ORAL_TABLET | Freq: Two times a day (BID) | ORAL | 0 refills | Status: AC

## 2023-12-20 MED ORDER — CEFTRIAXONE SODIUM 1 G IJ SOLR
1.0000 g | Freq: Once | INTRAMUSCULAR | Status: AC
  Administered 2023-12-20: 1 g via INTRAMUSCULAR

## 2023-12-20 MED ORDER — LIDOCAINE HCL (PF) 1 % IJ SOLN
INTRAMUSCULAR | Status: AC
  Filled 2023-12-20: qty 2

## 2023-12-20 NOTE — Discharge Instructions (Signed)
 We are treating you for urinary tract infection.  Start sulfamethoxazole trimethoprim twice daily for 7 days.  If you develop any rash or oral lesions stop the medication and be seen immediately.  I will contact you if any of your blood work is abnormal or if we need to change antibiotics based on your culture results.  Follow-up as soon as possible with your urologist; call them to schedule an appointment tomorrow.  If anything changes or worsens and you have increasing pain, fever, nausea, vomiting, blood in your urine you need to go to the emergency room immediately.

## 2023-12-20 NOTE — ED Provider Notes (Signed)
 [facility]    CSN: [id number] Arrival date & time: 12/20/23  1258      History   Chief Complaint Chief Complaint  Patient presents with   Abdominal Pain    Abdominal pain, blood in urine, urinary urgency x5 days    HPI Ryan Cole is a 68 y.o. male.   Patient presents today with a 4 to 5-day history of UTI symptoms.  On 10/26/2023 he had prostatectomy at the [facility] that also required instrumentation and repair of the bladder.  He has had a Foley catheter since then.  At the end of February he had UTI symptoms including burning and frequency and admitted for 3 days for a complicated UTI.  He reports over the past several days he has had recurrent of symptoms including some blood in his Foley bag, urinary urgency, dysuria, lower abdominal pressure.  Discomfort is rated 4 and a 0-10 pain scale, described as pressure, no alleviating factors notified.  He was treated with IV antibiotics Zosyn and bladder irrigation.  He was given dicyclomine for bladder spasms but has not been taking this recently as he did not think it was necessary and ran out of the medicine.  He has not had any antibiotics since then.  [facility] on [facility] for a scan and next week with his urologist.  He denies any fever, nausea, vomiting, increased rectal pain, body aches.    Past Medical History:  Diagnosis Date   CEREBROVASCULAR ACCIDENT, HX OF 06/08/2007   Chronic systolic CHF (congestive heart failure) (HCC) 08/29/2015   DYSPHORIA 10/10/2007   HYPERLIPIDEMIA 06/08/2007   Hypertension    HYPOGONADISM, MALE 10/10/2007   INSOMNIA-SLEEP DISORDER-UNSPEC 10/10/2007   LOW BACK PAIN 06/08/2007   WEAKNESS, LEFT SIDE OF BODY 10/10/2007    Patient Active Problem List   Diagnosis Date Noted   Hematuria 11/12/2023   Gross hematuria 11/11/2023   AKI (acute kidney injury) (HCC) 08/30/2015   Sepsis due to Klebsiella pneumoniae (HCC) 08/30/2015   Pleural effusion on  left    Gram-negative bacteremia 08/29/2015   Chronic systolic CHF (congestive heart failure) (HCC) 08/29/2015   Urinary tract infectious disease    Urinary tract infection 08/28/2015   Sepsis (HCC) 08/28/2015   Leukocytosis 08/28/2015   Sinus tachycardia 08/28/2015   Lower urinary tract infectious disease 08/28/2015   Urinary retention 08/28/2015   Abnormal echocardiogram    Elevated troponin    HTN (hypertension) 08/09/2015   Ulcerative colitis (HCC) 08/09/2015   Atrial fibrillation (HCC) 08/09/2015   Thoracic aortic dissection (HCC) 08/03/2015   Abdominal pain, other specified site 05/30/2011   Preventative health care 05/28/2011   HYPOGONADISM, MALE 10/10/2007   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   DYSPHORIA 10/10/2007   Hemiplegia (HCC) 10/10/2007   HYPERLIPIDEMIA 06/08/2007   LOW BACK PAIN 06/08/2007   History of cardiovascular disorder 06/08/2007    Past Surgical History:  Procedure Laterality Date   ABDOMINAL SURGERY  2014   removal of benign colon mass    achilles     CARDIAC CATHETERIZATION N/A 08/11/2015   Procedure: Left Heart Cath and Coronary Angiography;  Surgeon: Corky Crafts, MD;  Location: [facility] INVASIVE CV LAB;  Service: Cardiovascular;  Laterality: N/A;   CARDIOVERSION N/A 08/14/2015   Procedure: CARDIOVERSION;  Surgeon: Lewayne Bunting, MD;  Location: [facility] ENDOSCOPY;  Service: Cardiovascular;  Laterality: N/A;   TEE WITHOUT CARDIOVERSION N/A 08/14/2015   Procedure: TRANSESOPHAGEAL ECHOCARDIOGRAM (TEE);  Surgeon: Lewayne Bunting, MD;  Location: [facility] ENDOSCOPY;  Service: Cardiovascular;  Laterality: N/A;   THORACIC AORTIC ANEURYSM REPAIR N/A 08/02/2015   Procedure: THORACIC ASCENDING ANEURYSM REPAIR (AAA);  Surgeon: Loreli Slot, MD;  Location: [facility] OR;  Service: Open Heart Surgery;  Laterality: N/A;       Home Medications    Prior to Admission medications   Medication Sig Start Date End Date Taking? Authorizing Provider  acetaminophen  (TYLENOL) 500 MG tablet Take 2 tablets (1,000 mg total) by mouth every 6 (six) hours as needed for mild pain. 08/16/15  Yes Doree Fudge M, PA-C  aspirin EC 81 MG EC tablet Take 1 tablet (81 mg total) by mouth daily. 08/16/15  Yes Doree Fudge M, PA-C  atorvastatin (LIPITOR) 80 MG tablet Take 1 tablet (80 mg total) by mouth daily at 6 PM. 08/16/15  Yes Ardelle Balls, PA-C  Biotin 20254 MCG TBDP Take 1 capsule by mouth daily.   Yes [provider]  cloNIDine (CATAPRES) 0.1 MG tablet Take 1 tablet (0.1 mg total) by mouth 2 (two) times daily. Patient taking differently: Take 0.1 mg by mouth as needed (Elevated blood pressure). Take when blood pressure is >180 11/27/22  Yes Pollina, Canary Brim, MD  Cyanocobalamin 500 MCG CHEW Chew 1 tablet by mouth daily.   Yes [provider]  diclofenac (VOLTAREN) 50 MG EC tablet Take 50 mg by mouth as needed for moderate pain (pain score 4-6) or mild pain (pain score 1-3). 10/25/23  Yes [provider]  ferrous sulfate 325 (65 FE) MG EC tablet Take 325 mg by mouth daily with breakfast.   Yes [provider]  furosemide (LASIX) 20 MG tablet Take 20 mg by mouth daily. 08/19/20  Yes [provider]  hyoscyamine (LEVSIN SL) 0.125 MG SL tablet Place 1 tablet (0.125 mg total) under the tongue every 4 (four) hours as needed. 11/13/23  Yes Wyline Mood, NP  lisinopril (ZESTRIL) 40 MG tablet Take 40 mg by mouth at bedtime. 12/25/20  Yes [provider]  mesalamine (CANASA) 1000 MG suppository Place 1,000 mg rectally at bedtime.   Yes [provider]  mesalamine (LIALDA) 1.2 g EC tablet Take 1.2 g by mouth 4 (four) times daily. Take with meals   Yes [provider]  metoprolol succinate (TOPROL-XL) 100 MG 24 hr tablet TAKE ONE-HALF TABLET BY MOUTH TWO TIMES A DAY FOR HEART AND BLOOD PRESSURE 12/25/20  Yes [provider]  Multiple Vitamin (MULTIVITAMIN WITH MINERALS) TABS  tablet Take 1 tablet by mouth daily.   Yes [provider]  oxybutynin (DITROPAN) 5 MG tablet Take 5 mg by mouth every 8 (eight) hours as needed. 10/30/23  Yes [provider]  polyethylene glycol powder (GLYCOLAX/MIRALAX) 17 GM/SCOOP powder Take 1 capful (17 g) by mouth daily as needed for mild constipation. 11/13/23  Yes Sheikh, Omair Latif, DO  Potassium 99 MG TABS Take 2 tablets by mouth daily.   Yes [provider]  senna-docusate (SENOKOT-S) 8.6-50 MG tablet Take 2 tablets by mouth 2 (two) times daily. 11/07/23  Yes [provider]  sulfamethoxazole-trimethoprim (BACTRIM DS) 800-160 MG tablet Take 1 tablet by mouth 2 (two) times daily for 7 days. 12/20/23 12/27/23 Yes Shaya Reddick, Noberto Retort, PA-C  tamsulosin (FLOMAX) 0.4 MG CAPS capsule Take 0.4 mg by mouth at bedtime. 02/27/23  Yes [provider]    Family History Family History  Problem Relation Age of Onset   Heart failure Father  26    Social History Social History   Tobacco Use   Smoking status: Never  Substance Use Topics   Alcohol use: No   Drug use: No     Allergies   Patient has no known allergies.   Review of Systems Review of Systems  Constitutional:  Positive for activity change. Negative for appetite change, fatigue and fever.  Respiratory:  Negative for shortness of breath.   Cardiovascular:  Negative for chest pain.  Gastrointestinal:  Positive for abdominal pain. Negative for constipation, diarrhea, nausea and vomiting.  Genitourinary:  Positive for dysuria, hematuria and urgency. Negative for genital sores.  Musculoskeletal:  Negative for arthralgias, back pain and myalgias.     Physical Exam Triage Vital Signs ED Triage Vitals  Encounter Vitals Group     BP 12/20/23 1436 (!) 184/65     Systolic BP Percentile --      Diastolic BP Percentile --      Pulse Rate 12/20/23 1436 62     Resp --      Temp 12/20/23 1436 97.9 F (36.6 C)     Temp Source 12/20/23 1436 Oral      SpO2 12/20/23 1436 98 %     Weight 12/20/23 1434 200 lb (90.7 kg)     Height 12/20/23 1434 6' (1.829 m)     Head Circumference --      Peak Flow --      Pain Score 12/20/23 1434 4     Pain Loc --      Pain Education --      Exclude from Growth Chart --    No data found.  Updated Vital Signs BP (!) 157/59 (BP Location: Left Arm)   Pulse 62   Temp 97.9 F (36.6 C) (Oral)   Ht 6' (1.829 m)   Wt 200 lb (90.7 kg)   SpO2 98%   BMI 27.12 kg/m   Visual Acuity Right Eye Distance:   Left Eye Distance:   Bilateral Distance:    Right Eye Near:   Left Eye Near:    Bilateral Near:     Physical Exam Vitals reviewed.  Constitutional:      General: He is awake.     Appearance: Normal appearance. He is well-developed. He is not ill-appearing.     Comments: Very pleasant male appear stated age in no acute distress sitting comfortably in exam room  HENT:     Head: Normocephalic and atraumatic.     Mouth/Throat:     Pharynx: Uvula midline. No oropharyngeal exudate or posterior oropharyngeal erythema.  Cardiovascular:     Rate and Rhythm: Normal rate and regular rhythm.     Heart sounds: Normal heart sounds, S1 normal and S2 normal. No murmur heard. Pulmonary:     Effort: Pulmonary effort is normal.     Breath sounds: Normal breath sounds. No stridor. No wheezing, rhonchi or rales.     Comments: Clear to auscultation bilaterally Abdominal:     General: Bowel sounds are normal.     Palpations: Abdomen is soft.     Tenderness: There is no abdominal tenderness. There is no right CVA tenderness, left CVA tenderness, guarding or rebound.     Comments: Benign abdominal exam.  No significant tenderness palpation on exam.  Neurological:     Mental Status: He is alert.  Psychiatric:        Behavior: Behavior is cooperative.      UC Treatments / Results  Labs (all  labs ordered are listed, but only abnormal results are displayed) Labs Reviewed  POCT URINALYSIS DIP (MANUAL ENTRY)  - Abnormal; Notable for the following components:      Result Value   Blood, UA moderate (*)    Protein Ur, POC =30 (*)    Leukocytes, UA Small (1+) (*)    All other components within normal limits  URINE CULTURE  CBC WITH DIFFERENTIAL/PLATELET  BASIC METABOLIC PANEL    EKG   Radiology No results found.  Procedures Procedures (including critical care time)  Medications Ordered in UC Medications  cefTRIAXone (ROCEPHIN) injection 1 g (1 g Intramuscular Given 12/20/23 1534)    Initial Impression / Assessment and Plan / UC Course  I have reviewed the triage vital signs and the nursing notes.  Pertinent labs & imaging results that were available during my care of the patient were reviewed by me and considered in my medical decision making (see chart for details).     Patient is well-appearing, afebrile, nontoxic, nontachycardic.  Vital signs and physical exam are reassuring so we will defer emergency room evaluation for the time being.  I am very concerned for complicated UTI given his recent urogenital procedure and indwelling Foley catheter.  UA was obtained concerning for infection.  He was given Rocephin in clinic and started on Bactrim DS twice daily for 7 days.  No indication for dose adjustment based on metabolic panel from 11/13/2023 with creatinine of 1.18 and calculated creatinine clearance of 79.02.  This was chosen over ciprofloxacin as he reports having ciprofloxacin recently and he has a slightly prolonged QT on his last EKG which was obtained 11/27/2022 with a QTc of 478.  We discussed that we are unable to replace his Foley in our clinic so I recommended that he follow-up with his urologist ideally tomorrow.  He is to rest and drink plenty of fluid.  Basic blood work including CBC and BMP obtained and we will contact him if this is abnormal and changes our treatment plan.  We discussed that if he has any worsening or changing symptoms including fever, nausea/vomiting  interfering with oral intake, weakness he needs to go to emergency room immediately.  Discussed that given his history he should have a low threshold for going to the ER immediately.  Discussed the importance of follow-up with his urologist.  Strict return precautions given.  All questions were answered to his and wife's satisfaction.  Final Clinical Impressions(s) / UC Diagnoses   Final diagnoses:  Urinary tract infection associated with indwelling urethral catheter, initial encounter [facility])     Discharge Instructions      We are treating you for urinary tract infection.  Start sulfamethoxazole trimethoprim twice daily for 7 days.  If you develop any rash or oral lesions stop the medication and be seen immediately.  I will contact you if any of your blood work is abnormal or if we need to change antibiotics based on your culture results.  Follow-up as soon as possible with your urologist; call them to schedule an appointment tomorrow.  If anything changes or worsens and you have increasing pain, fever, nausea, vomiting, blood in your urine you need to go to the emergency room immediately.     ED Prescriptions     Medication Sig Dispense Auth. Provider   sulfamethoxazole-trimethoprim (BACTRIM DS) 800-160 MG tablet Take 1 tablet by mouth 2 (two) times daily for 7 days. 14 tablet Camron Essman, Noberto Retort, PA-C      PDMP not  reviewed this encounter.   Jeani Hawking, PA-C 12/20/23 1621

## 2023-12-20 NOTE — ED Triage Notes (Signed)
 Pt recently had surgery on his bladder.

## 2023-12-20 NOTE — ED Triage Notes (Signed)
 Pt states that he has some abdominal pain, blood in urine (pt has a catheter), urinary urgency. X5 days

## 2023-12-22 ENCOUNTER — Other Ambulatory Visit (HOSPITAL_COMMUNITY): Payer: Self-pay | Admitting: Physician Assistant

## 2023-12-22 ENCOUNTER — Telehealth (HOSPITAL_COMMUNITY): Payer: Self-pay

## 2023-12-22 DIAGNOSIS — N390 Urinary tract infection, site not specified: Secondary | ICD-10-CM

## 2023-12-22 LAB — URINE CULTURE: Culture: >=100000 — AB

## 2023-12-22 MED ORDER — CIPROFLOXACIN HCL 250 MG PO TABS
250.0000 mg | ORAL_TABLET | Freq: Two times a day (BID) | ORAL | 0 refills | Status: AC
Start: 2023-12-22 — End: ?

## 2023-12-22 NOTE — Progress Notes (Signed)
 Recommend changing from Bactrim to Ciprofloxacin 250 mg PO BID x 5 days per sensitivity results. Medication sent in to pharmacy on file. He should stop the Bactrim and start Cipro once it is picked up from pharmacy. Continue with other discussed measures from UC visit.

## 2023-12-22 NOTE — Telephone Encounter (Signed)
 Advised of results and med changes. Verbalized understanding.

## 2024-04-12 ENCOUNTER — Other Ambulatory Visit: Payer: Self-pay
